# Patient Record
Sex: Male | Born: 1935
Health system: Southern US, Community
[De-identification: ages and names within clinical notes are randomized; demographics above are authoritative.]

## PROBLEM LIST (undated history)

## (undated) DIAGNOSIS — R600 Localized edema: Secondary | ICD-10-CM

## (undated) DIAGNOSIS — IMO0002 Reserved for concepts with insufficient information to code with codable children: Secondary | ICD-10-CM

## (undated) DIAGNOSIS — Q600 Renal agenesis, unilateral: Secondary | ICD-10-CM

## (undated) DIAGNOSIS — H548 Legal blindness, as defined in USA: Secondary | ICD-10-CM

## (undated) DIAGNOSIS — M1990 Unspecified osteoarthritis, unspecified site: Secondary | ICD-10-CM

## (undated) DIAGNOSIS — H3530 Unspecified macular degeneration: Secondary | ICD-10-CM

## (undated) DIAGNOSIS — T462X1A Poisoning by other antidysrhythmic drugs, accidental (unintentional), initial encounter: Secondary | ICD-10-CM

## (undated) DIAGNOSIS — K219 Gastro-esophageal reflux disease without esophagitis: Secondary | ICD-10-CM

## (undated) DIAGNOSIS — N183 Chronic kidney disease, stage 3 unspecified: Secondary | ICD-10-CM

## (undated) DIAGNOSIS — D649 Anemia, unspecified: Secondary | ICD-10-CM

## (undated) DIAGNOSIS — R310 Gross hematuria: Secondary | ICD-10-CM

## (undated) DIAGNOSIS — N135 Crossing vessel and stricture of ureter without hydronephrosis: Secondary | ICD-10-CM

## (undated) DIAGNOSIS — I509 Heart failure, unspecified: Secondary | ICD-10-CM

## (undated) DIAGNOSIS — I10 Essential (primary) hypertension: Secondary | ICD-10-CM

## (undated) DIAGNOSIS — I482 Chronic atrial fibrillation, unspecified: Secondary | ICD-10-CM

## (undated) DIAGNOSIS — R112 Nausea with vomiting, unspecified: Secondary | ICD-10-CM

## (undated) DIAGNOSIS — C679 Malignant neoplasm of bladder, unspecified: Secondary | ICD-10-CM

## (undated) DIAGNOSIS — B3741 Candidal cystitis and urethritis: Secondary | ICD-10-CM

## (undated) DIAGNOSIS — K08109 Complete loss of teeth, unspecified cause, unspecified class: Secondary | ICD-10-CM

## (undated) DIAGNOSIS — Z972 Presence of dental prosthetic device (complete) (partial): Secondary | ICD-10-CM

## (undated) DIAGNOSIS — Z9989 Dependence on other enabling machines and devices: Secondary | ICD-10-CM

## (undated) DIAGNOSIS — H409 Unspecified glaucoma: Secondary | ICD-10-CM

## (undated) DIAGNOSIS — Z87442 Personal history of urinary calculi: Secondary | ICD-10-CM

## (undated) DIAGNOSIS — E032 Hypothyroidism due to medicaments and other exogenous substances: Secondary | ICD-10-CM

## (undated) DIAGNOSIS — Z9889 Other specified postprocedural states: Secondary | ICD-10-CM

## (undated) DIAGNOSIS — Z8719 Personal history of other diseases of the digestive system: Secondary | ICD-10-CM

## (undated) HISTORY — PX: GLAUCOMA SURGERY: SHX656

## (undated) HISTORY — PX: EXTRACORPOREAL SHOCK WAVE LITHOTRIPSY: SHX1557

## (undated) HISTORY — PX: CATARACT EXTRACTION W/ INTRAOCULAR LENS  IMPLANT, BILATERAL: SHX1307

## (undated) HISTORY — PX: INGUINAL HERNIA REPAIR: SUR1180

## (undated) HISTORY — PX: COLONOSCOPY: SHX174

---

## 2000-07-10 HISTORY — PX: CARDIOVERSION: SHX1299

## 2000-07-10 HISTORY — PX: TRANSURETHRAL RESECTION OF PROSTATE: SHX73

## 2004-06-17 ENCOUNTER — Ambulatory Visit: Payer: Self-pay | Admitting: Cardiology

## 2005-02-15 ENCOUNTER — Ambulatory Visit: Payer: Self-pay | Admitting: Cardiology

## 2015-08-25 DIAGNOSIS — H401132 Primary open-angle glaucoma, bilateral, moderate stage: Secondary | ICD-10-CM | POA: Diagnosis not present

## 2015-08-26 DIAGNOSIS — Z79899 Other long term (current) drug therapy: Secondary | ICD-10-CM | POA: Diagnosis not present

## 2015-08-26 DIAGNOSIS — I10 Essential (primary) hypertension: Secondary | ICD-10-CM | POA: Diagnosis not present

## 2015-08-26 DIAGNOSIS — R5381 Other malaise: Secondary | ICD-10-CM | POA: Diagnosis not present

## 2015-08-26 DIAGNOSIS — R531 Weakness: Secondary | ICD-10-CM | POA: Diagnosis not present

## 2015-08-26 DIAGNOSIS — E119 Type 2 diabetes mellitus without complications: Secondary | ICD-10-CM | POA: Diagnosis not present

## 2015-10-04 DIAGNOSIS — R1030 Lower abdominal pain, unspecified: Secondary | ICD-10-CM | POA: Diagnosis not present

## 2015-10-04 DIAGNOSIS — E119 Type 2 diabetes mellitus without complications: Secondary | ICD-10-CM | POA: Diagnosis not present

## 2015-10-04 DIAGNOSIS — R5381 Other malaise: Secondary | ICD-10-CM | POA: Diagnosis not present

## 2015-10-04 DIAGNOSIS — I480 Paroxysmal atrial fibrillation: Secondary | ICD-10-CM | POA: Diagnosis not present

## 2015-10-04 DIAGNOSIS — Z79899 Other long term (current) drug therapy: Secondary | ICD-10-CM | POA: Diagnosis not present

## 2015-10-04 DIAGNOSIS — I482 Chronic atrial fibrillation: Secondary | ICD-10-CM | POA: Diagnosis not present

## 2015-10-04 DIAGNOSIS — I10 Essential (primary) hypertension: Secondary | ICD-10-CM | POA: Diagnosis not present

## 2015-10-04 DIAGNOSIS — R531 Weakness: Secondary | ICD-10-CM | POA: Diagnosis not present

## 2015-11-04 DIAGNOSIS — N390 Urinary tract infection, site not specified: Secondary | ICD-10-CM | POA: Diagnosis not present

## 2015-11-04 DIAGNOSIS — I10 Essential (primary) hypertension: Secondary | ICD-10-CM | POA: Diagnosis not present

## 2015-11-04 DIAGNOSIS — R351 Nocturia: Secondary | ICD-10-CM | POA: Diagnosis not present

## 2015-11-04 DIAGNOSIS — E119 Type 2 diabetes mellitus without complications: Secondary | ICD-10-CM | POA: Diagnosis not present

## 2015-11-04 DIAGNOSIS — Z79899 Other long term (current) drug therapy: Secondary | ICD-10-CM | POA: Diagnosis not present

## 2015-12-02 DIAGNOSIS — I482 Chronic atrial fibrillation: Secondary | ICD-10-CM | POA: Diagnosis not present

## 2015-12-02 DIAGNOSIS — Z79899 Other long term (current) drug therapy: Secondary | ICD-10-CM | POA: Diagnosis not present

## 2015-12-02 DIAGNOSIS — R531 Weakness: Secondary | ICD-10-CM | POA: Diagnosis not present

## 2015-12-02 DIAGNOSIS — I480 Paroxysmal atrial fibrillation: Secondary | ICD-10-CM | POA: Diagnosis not present

## 2015-12-02 DIAGNOSIS — E119 Type 2 diabetes mellitus without complications: Secondary | ICD-10-CM | POA: Diagnosis not present

## 2015-12-02 DIAGNOSIS — R5381 Other malaise: Secondary | ICD-10-CM | POA: Diagnosis not present

## 2015-12-02 DIAGNOSIS — I10 Essential (primary) hypertension: Secondary | ICD-10-CM | POA: Diagnosis not present

## 2015-12-23 DIAGNOSIS — H401233 Low-tension glaucoma, bilateral, severe stage: Secondary | ICD-10-CM | POA: Diagnosis not present

## 2016-02-01 DIAGNOSIS — E119 Type 2 diabetes mellitus without complications: Secondary | ICD-10-CM | POA: Diagnosis not present

## 2016-02-01 DIAGNOSIS — R531 Weakness: Secondary | ICD-10-CM | POA: Diagnosis not present

## 2016-02-01 DIAGNOSIS — R5381 Other malaise: Secondary | ICD-10-CM | POA: Diagnosis not present

## 2016-02-01 DIAGNOSIS — Z79899 Other long term (current) drug therapy: Secondary | ICD-10-CM | POA: Diagnosis not present

## 2016-02-01 DIAGNOSIS — N390 Urinary tract infection, site not specified: Secondary | ICD-10-CM | POA: Diagnosis not present

## 2016-02-01 DIAGNOSIS — I10 Essential (primary) hypertension: Secondary | ICD-10-CM | POA: Diagnosis not present

## 2016-02-01 DIAGNOSIS — I482 Chronic atrial fibrillation: Secondary | ICD-10-CM | POA: Diagnosis not present

## 2016-02-01 DIAGNOSIS — E039 Hypothyroidism, unspecified: Secondary | ICD-10-CM | POA: Diagnosis not present

## 2016-02-15 DIAGNOSIS — H401233 Low-tension glaucoma, bilateral, severe stage: Secondary | ICD-10-CM | POA: Diagnosis not present

## 2016-03-09 DIAGNOSIS — E119 Type 2 diabetes mellitus without complications: Secondary | ICD-10-CM | POA: Diagnosis not present

## 2016-03-09 DIAGNOSIS — I10 Essential (primary) hypertension: Secondary | ICD-10-CM | POA: Diagnosis not present

## 2016-03-16 DIAGNOSIS — R319 Hematuria, unspecified: Secondary | ICD-10-CM | POA: Diagnosis not present

## 2016-03-16 DIAGNOSIS — Q618 Other cystic kidney diseases: Secondary | ICD-10-CM | POA: Diagnosis not present

## 2016-03-28 DIAGNOSIS — H401233 Low-tension glaucoma, bilateral, severe stage: Secondary | ICD-10-CM | POA: Diagnosis not present

## 2016-04-04 DIAGNOSIS — I10 Essential (primary) hypertension: Secondary | ICD-10-CM | POA: Diagnosis not present

## 2016-04-04 DIAGNOSIS — E119 Type 2 diabetes mellitus without complications: Secondary | ICD-10-CM | POA: Diagnosis not present

## 2016-04-04 DIAGNOSIS — R5381 Other malaise: Secondary | ICD-10-CM | POA: Diagnosis not present

## 2016-04-04 DIAGNOSIS — Z79899 Other long term (current) drug therapy: Secondary | ICD-10-CM | POA: Diagnosis not present

## 2016-04-04 DIAGNOSIS — N189 Chronic kidney disease, unspecified: Secondary | ICD-10-CM | POA: Diagnosis not present

## 2016-04-04 DIAGNOSIS — D649 Anemia, unspecified: Secondary | ICD-10-CM | POA: Diagnosis not present

## 2016-04-04 DIAGNOSIS — N390 Urinary tract infection, site not specified: Secondary | ICD-10-CM | POA: Diagnosis not present

## 2016-04-04 DIAGNOSIS — R531 Weakness: Secondary | ICD-10-CM | POA: Diagnosis not present

## 2016-04-19 DIAGNOSIS — N181 Chronic kidney disease, stage 1: Secondary | ICD-10-CM | POA: Diagnosis not present

## 2016-04-19 DIAGNOSIS — E119 Type 2 diabetes mellitus without complications: Secondary | ICD-10-CM | POA: Diagnosis not present

## 2016-05-03 DIAGNOSIS — Z79899 Other long term (current) drug therapy: Secondary | ICD-10-CM | POA: Diagnosis not present

## 2016-05-03 DIAGNOSIS — H409 Unspecified glaucoma: Secondary | ICD-10-CM | POA: Diagnosis not present

## 2016-05-03 DIAGNOSIS — N390 Urinary tract infection, site not specified: Secondary | ICD-10-CM | POA: Diagnosis not present

## 2016-05-03 DIAGNOSIS — Z87891 Personal history of nicotine dependence: Secondary | ICD-10-CM | POA: Diagnosis not present

## 2016-05-03 DIAGNOSIS — D649 Anemia, unspecified: Secondary | ICD-10-CM | POA: Diagnosis not present

## 2016-05-03 DIAGNOSIS — E039 Hypothyroidism, unspecified: Secondary | ICD-10-CM | POA: Diagnosis not present

## 2016-05-03 DIAGNOSIS — R310 Gross hematuria: Secondary | ICD-10-CM | POA: Diagnosis not present

## 2016-05-03 DIAGNOSIS — Z87442 Personal history of urinary calculi: Secondary | ICD-10-CM | POA: Diagnosis not present

## 2016-05-03 DIAGNOSIS — I10 Essential (primary) hypertension: Secondary | ICD-10-CM | POA: Diagnosis not present

## 2016-05-03 DIAGNOSIS — R339 Retention of urine, unspecified: Secondary | ICD-10-CM | POA: Diagnosis not present

## 2016-05-03 DIAGNOSIS — I482 Chronic atrial fibrillation: Secondary | ICD-10-CM | POA: Diagnosis not present

## 2016-05-08 DIAGNOSIS — C651 Malignant neoplasm of right renal pelvis: Secondary | ICD-10-CM | POA: Diagnosis not present

## 2016-05-08 DIAGNOSIS — T83028A Displacement of other indwelling urethral catheter, initial encounter: Secondary | ICD-10-CM | POA: Diagnosis not present

## 2016-05-08 DIAGNOSIS — I10 Essential (primary) hypertension: Secondary | ICD-10-CM | POA: Diagnosis not present

## 2016-05-08 DIAGNOSIS — R319 Hematuria, unspecified: Secondary | ICD-10-CM | POA: Diagnosis not present

## 2016-05-08 DIAGNOSIS — R102 Pelvic and perineal pain: Secondary | ICD-10-CM | POA: Diagnosis not present

## 2016-05-08 DIAGNOSIS — T8189XA Other complications of procedures, not elsewhere classified, initial encounter: Secondary | ICD-10-CM | POA: Diagnosis not present

## 2016-05-08 DIAGNOSIS — N390 Urinary tract infection, site not specified: Secondary | ICD-10-CM | POA: Diagnosis not present

## 2016-05-08 DIAGNOSIS — R1030 Lower abdominal pain, unspecified: Secondary | ICD-10-CM | POA: Diagnosis not present

## 2016-05-08 DIAGNOSIS — T82898A Other specified complication of vascular prosthetic devices, implants and grafts, initial encounter: Secondary | ICD-10-CM | POA: Diagnosis not present

## 2016-05-08 DIAGNOSIS — R109 Unspecified abdominal pain: Secondary | ICD-10-CM | POA: Diagnosis not present

## 2016-05-08 DIAGNOSIS — Z79899 Other long term (current) drug therapy: Secondary | ICD-10-CM | POA: Diagnosis not present

## 2016-05-08 DIAGNOSIS — R339 Retention of urine, unspecified: Secondary | ICD-10-CM | POA: Diagnosis not present

## 2016-05-12 DIAGNOSIS — N2889 Other specified disorders of kidney and ureter: Secondary | ICD-10-CM | POA: Diagnosis not present

## 2016-05-18 DIAGNOSIS — R109 Unspecified abdominal pain: Secondary | ICD-10-CM | POA: Diagnosis not present

## 2016-05-18 DIAGNOSIS — N2889 Other specified disorders of kidney and ureter: Secondary | ICD-10-CM | POA: Diagnosis not present

## 2016-05-18 DIAGNOSIS — N3289 Other specified disorders of bladder: Secondary | ICD-10-CM | POA: Diagnosis not present

## 2016-05-18 DIAGNOSIS — R112 Nausea with vomiting, unspecified: Secondary | ICD-10-CM | POA: Diagnosis not present

## 2016-06-04 DIAGNOSIS — D49511 Neoplasm of unspecified behavior of right kidney: Secondary | ICD-10-CM | POA: Diagnosis not present

## 2016-06-04 DIAGNOSIS — H409 Unspecified glaucoma: Secondary | ICD-10-CM | POA: Diagnosis not present

## 2016-06-04 DIAGNOSIS — R319 Hematuria, unspecified: Secondary | ICD-10-CM | POA: Diagnosis not present

## 2016-06-04 DIAGNOSIS — R339 Retention of urine, unspecified: Secondary | ICD-10-CM | POA: Diagnosis not present

## 2016-06-04 DIAGNOSIS — C649 Malignant neoplasm of unspecified kidney, except renal pelvis: Secondary | ICD-10-CM | POA: Diagnosis not present

## 2016-06-04 DIAGNOSIS — I10 Essential (primary) hypertension: Secondary | ICD-10-CM | POA: Diagnosis not present

## 2016-06-04 DIAGNOSIS — N1330 Unspecified hydronephrosis: Secondary | ICD-10-CM | POA: Diagnosis not present

## 2016-06-04 DIAGNOSIS — E039 Hypothyroidism, unspecified: Secondary | ICD-10-CM | POA: Diagnosis not present

## 2016-06-04 DIAGNOSIS — Z79899 Other long term (current) drug therapy: Secondary | ICD-10-CM | POA: Diagnosis not present

## 2016-06-04 DIAGNOSIS — N390 Urinary tract infection, site not specified: Secondary | ICD-10-CM | POA: Diagnosis not present

## 2016-06-04 DIAGNOSIS — R8299 Other abnormal findings in urine: Secondary | ICD-10-CM | POA: Diagnosis not present

## 2016-06-08 DIAGNOSIS — N1330 Unspecified hydronephrosis: Secondary | ICD-10-CM | POA: Diagnosis not present

## 2016-06-08 DIAGNOSIS — I2510 Atherosclerotic heart disease of native coronary artery without angina pectoris: Secondary | ICD-10-CM | POA: Diagnosis not present

## 2016-06-08 DIAGNOSIS — I509 Heart failure, unspecified: Secondary | ICD-10-CM | POA: Diagnosis not present

## 2016-06-08 DIAGNOSIS — I10 Essential (primary) hypertension: Secondary | ICD-10-CM | POA: Diagnosis not present

## 2016-06-08 DIAGNOSIS — I4891 Unspecified atrial fibrillation: Secondary | ICD-10-CM | POA: Diagnosis not present

## 2016-06-08 DIAGNOSIS — R310 Gross hematuria: Secondary | ICD-10-CM | POA: Diagnosis not present

## 2016-06-08 DIAGNOSIS — R339 Retention of urine, unspecified: Secondary | ICD-10-CM | POA: Diagnosis not present

## 2016-06-13 DIAGNOSIS — N2889 Other specified disorders of kidney and ureter: Secondary | ICD-10-CM | POA: Diagnosis not present

## 2016-06-13 DIAGNOSIS — R338 Other retention of urine: Secondary | ICD-10-CM | POA: Diagnosis not present

## 2016-06-13 DIAGNOSIS — Z23 Encounter for immunization: Secondary | ICD-10-CM | POA: Diagnosis not present

## 2016-06-22 DIAGNOSIS — R3912 Poor urinary stream: Secondary | ICD-10-CM | POA: Diagnosis not present

## 2016-06-22 DIAGNOSIS — C651 Malignant neoplasm of right renal pelvis: Secondary | ICD-10-CM | POA: Diagnosis not present

## 2016-06-22 DIAGNOSIS — C672 Malignant neoplasm of lateral wall of bladder: Secondary | ICD-10-CM | POA: Diagnosis not present

## 2016-06-22 DIAGNOSIS — N261 Atrophy of kidney (terminal): Secondary | ICD-10-CM | POA: Diagnosis not present

## 2016-06-26 ENCOUNTER — Other Ambulatory Visit: Payer: Self-pay | Admitting: Urology

## 2016-07-07 ENCOUNTER — Encounter (HOSPITAL_BASED_OUTPATIENT_CLINIC_OR_DEPARTMENT_OTHER): Payer: Self-pay | Admitting: *Deleted

## 2016-07-07 NOTE — Progress Notes (Addendum)
NPO AFTER MN.  ARRIVE AT 0700.  NEEDS ISTAT 8 AND EKG.  WILL TAKE METOPROLOL AND TYLENOL AM DOS W/ SIPS OF WATER.    RECEIVED LOV NOTES AND CURRENT EKG FROM PT'S PCP, DR Cleda Mccreedy IN [facility] [phone], FAX).  ASK MDA DOS IF REPEAT EKG.

## 2016-07-11 ENCOUNTER — Encounter (HOSPITAL_BASED_OUTPATIENT_CLINIC_OR_DEPARTMENT_OTHER): Payer: Self-pay | Admitting: *Deleted

## 2016-07-12 ENCOUNTER — Ambulatory Visit (HOSPITAL_BASED_OUTPATIENT_CLINIC_OR_DEPARTMENT_OTHER): Payer: Medicare Other | Admitting: Anesthesiology

## 2016-07-12 ENCOUNTER — Observation Stay (HOSPITAL_BASED_OUTPATIENT_CLINIC_OR_DEPARTMENT_OTHER)
Admission: RE | Admit: 2016-07-12 | Discharge: 2016-07-13 | Disposition: A | Discharge status: Home / Self Care | Payer: Medicare Other | Source: Ambulatory Visit | Attending: Urology | Admitting: Urology

## 2016-07-12 ENCOUNTER — Encounter (HOSPITAL_BASED_OUTPATIENT_CLINIC_OR_DEPARTMENT_OTHER): Payer: Self-pay | Admitting: Anesthesiology

## 2016-07-12 ENCOUNTER — Encounter (HOSPITAL_COMMUNITY)
Admission: RE | Disposition: A | Discharge status: Home / Self Care | Payer: Self-pay | Source: Ambulatory Visit | Attending: Urology

## 2016-07-12 DIAGNOSIS — Z87891 Personal history of nicotine dependence: Secondary | ICD-10-CM | POA: Diagnosis not present

## 2016-07-12 DIAGNOSIS — C7919 Secondary malignant neoplasm of other urinary organs: Secondary | ICD-10-CM | POA: Diagnosis not present

## 2016-07-12 DIAGNOSIS — M1990 Unspecified osteoarthritis, unspecified site: Secondary | ICD-10-CM | POA: Insufficient documentation

## 2016-07-12 DIAGNOSIS — C661 Malignant neoplasm of right ureter: Secondary | ICD-10-CM | POA: Diagnosis not present

## 2016-07-12 DIAGNOSIS — I4891 Unspecified atrial fibrillation: Secondary | ICD-10-CM | POA: Insufficient documentation

## 2016-07-12 DIAGNOSIS — H3530 Unspecified macular degeneration: Secondary | ICD-10-CM | POA: Insufficient documentation

## 2016-07-12 DIAGNOSIS — C679 Malignant neoplasm of bladder, unspecified: Principal | ICD-10-CM | POA: Diagnosis present

## 2016-07-12 DIAGNOSIS — C651 Malignant neoplasm of right renal pelvis: Secondary | ICD-10-CM | POA: Diagnosis not present

## 2016-07-12 DIAGNOSIS — J449 Chronic obstructive pulmonary disease, unspecified: Secondary | ICD-10-CM | POA: Insufficient documentation

## 2016-07-12 DIAGNOSIS — I129 Hypertensive chronic kidney disease with stage 1 through stage 4 chronic kidney disease, or unspecified chronic kidney disease: Secondary | ICD-10-CM | POA: Diagnosis not present

## 2016-07-12 DIAGNOSIS — Z87442 Personal history of urinary calculi: Secondary | ICD-10-CM | POA: Diagnosis not present

## 2016-07-12 DIAGNOSIS — C672 Malignant neoplasm of lateral wall of bladder: Secondary | ICD-10-CM | POA: Diagnosis not present

## 2016-07-12 DIAGNOSIS — E039 Hypothyroidism, unspecified: Secondary | ICD-10-CM | POA: Insufficient documentation

## 2016-07-12 DIAGNOSIS — K219 Gastro-esophageal reflux disease without esophagitis: Secondary | ICD-10-CM | POA: Insufficient documentation

## 2016-07-12 DIAGNOSIS — N400 Enlarged prostate without lower urinary tract symptoms: Secondary | ICD-10-CM | POA: Insufficient documentation

## 2016-07-12 DIAGNOSIS — N183 Chronic kidney disease, stage 3 (moderate): Secondary | ICD-10-CM | POA: Diagnosis not present

## 2016-07-12 DIAGNOSIS — I482 Chronic atrial fibrillation: Secondary | ICD-10-CM | POA: Insufficient documentation

## 2016-07-12 HISTORY — DX: Unspecified osteoarthritis, unspecified site: M19.90

## 2016-07-12 HISTORY — PX: TRANSURETHRAL RESECTION OF BLADDER TUMOR: SHX2575

## 2016-07-12 HISTORY — DX: Other specified postprocedural states: Z98.890

## 2016-07-12 HISTORY — DX: Localized edema: R60.0

## 2016-07-12 HISTORY — DX: Malignant neoplasm of bladder, unspecified: C67.9

## 2016-07-12 HISTORY — DX: Unspecified macular degeneration: H35.30

## 2016-07-12 HISTORY — DX: Unspecified glaucoma: H40.9

## 2016-07-12 HISTORY — DX: Gastro-esophageal reflux disease without esophagitis: K21.9

## 2016-07-12 HISTORY — DX: Chronic atrial fibrillation, unspecified: I48.20

## 2016-07-12 HISTORY — DX: Chronic kidney disease, stage 3 unspecified: N18.30

## 2016-07-12 HISTORY — DX: Essential (primary) hypertension: I10

## 2016-07-12 HISTORY — DX: Hypothyroidism due to medicaments and other exogenous substances: E03.2

## 2016-07-12 HISTORY — DX: Chronic kidney disease, stage 3 (moderate): N18.3

## 2016-07-12 HISTORY — DX: Poisoning by other antidysrhythmic drugs, accidental (unintentional), initial encounter: T46.2X1A

## 2016-07-12 HISTORY — DX: Complete loss of teeth, unspecified cause, unspecified class: K08.109

## 2016-07-12 HISTORY — DX: Presence of dental prosthetic device (complete) (partial): Z97.2

## 2016-07-12 HISTORY — DX: Nausea with vomiting, unspecified: R11.2

## 2016-07-12 HISTORY — DX: Gross hematuria: R31.0

## 2016-07-12 HISTORY — DX: Personal history of urinary calculi: Z87.442

## 2016-07-12 HISTORY — DX: Personal history of other diseases of the digestive system: Z87.19

## 2016-07-12 LAB — POCT I-STAT, CHEM 8
BUN: 25 mg/dL — ABNORMAL HIGH (ref 6–20)
CALCIUM ION: 1.32 mmol/L (ref 1.15–1.40)
CHLORIDE: 104 mmol/L (ref 101–111)
Creatinine, Ser: 1.1 mg/dL (ref 0.61–1.24)
Glucose, Bld: 126 mg/dL — ABNORMAL HIGH (ref 65–99)
HCT: 33 % — ABNORMAL LOW (ref 39.0–52.0)
HEMOGLOBIN: 11.2 g/dL — AB (ref 13.0–17.0)
POTASSIUM: 4.3 mmol/L (ref 3.5–5.1)
SODIUM: 138 mmol/L (ref 135–145)
TCO2: 23 mmol/L (ref 0–100)

## 2016-07-12 SURGERY — TURBT (TRANSURETHRAL RESECTION OF BLADDER TUMOR)
Anesthesia: General | Site: Bladder | Laterality: Right

## 2016-07-12 MED ORDER — KCL IN DEXTROSE-NACL 20-5-0.45 MEQ/L-%-% IV SOLN
INTRAVENOUS | Status: DC
  Administered 2016-07-12: 15:00:00 via INTRAVENOUS
  Filled 2016-07-12 (×2): qty 1000

## 2016-07-12 MED ORDER — FENTANYL CITRATE (PF) 100 MCG/2ML IJ SOLN
25.0000 ug | INTRAMUSCULAR | Status: DC | PRN
  Administered 2016-07-12: 25 ug via INTRAVENOUS
  Filled 2016-07-12: qty 1

## 2016-07-12 MED ORDER — GABAPENTIN 300 MG PO CAPS
300.0000 mg | ORAL_CAPSULE | Freq: Every day | ORAL | Status: DC
  Administered 2016-07-12: 300 mg via ORAL
  Filled 2016-07-12: qty 1

## 2016-07-12 MED ORDER — IOHEXOL 300 MG/ML  SOLN
INTRAMUSCULAR | Status: DC | PRN
  Administered 2016-07-12: 20 mL

## 2016-07-12 MED ORDER — ACETAMINOPHEN 500 MG PO TABS
1000.0000 mg | ORAL_TABLET | Freq: Three times a day (TID) | ORAL | Status: DC
  Administered 2016-07-12: 1000 mg via ORAL
  Filled 2016-07-12 (×2): qty 2

## 2016-07-12 MED ORDER — BRIMONIDINE TARTRATE 0.15 % OP SOLN
1.0000 [drp] | Freq: Two times a day (BID) | OPHTHALMIC | Status: DC
  Administered 2016-07-12: 1 [drp] via OPHTHALMIC
  Filled 2016-07-12: qty 5

## 2016-07-12 MED ORDER — SENNOSIDES-DOCUSATE SODIUM 8.6-50 MG PO TABS
1.0000 | ORAL_TABLET | Freq: Two times a day (BID) | ORAL | Status: DC
  Administered 2016-07-12: 1 via ORAL
  Filled 2016-07-12: qty 1

## 2016-07-12 MED ORDER — PROPOFOL 10 MG/ML IV BOLUS
INTRAVENOUS | Status: AC
  Filled 2016-07-12: qty 40

## 2016-07-12 MED ORDER — GENTAMICIN SULFATE 40 MG/ML IJ SOLN
5.0000 mg/kg | INTRAVENOUS | Status: AC
  Administered 2016-07-12: 360 mg via INTRAVENOUS
  Filled 2016-07-12: qty 9

## 2016-07-12 MED ORDER — METOPROLOL TARTRATE 25 MG PO TABS
12.5000 mg | ORAL_TABLET | Freq: Three times a day (TID) | ORAL | Status: DC
  Administered 2016-07-12 – 2016-07-13 (×3): 12.5 mg via ORAL
  Filled 2016-07-12 (×3): qty 1

## 2016-07-12 MED ORDER — SUCCINYLCHOLINE CHLORIDE 200 MG/10ML IV SOSY
PREFILLED_SYRINGE | INTRAVENOUS | Status: AC
  Filled 2016-07-12: qty 10

## 2016-07-12 MED ORDER — SUCCINYLCHOLINE CHLORIDE 20 MG/ML IJ SOLN
INTRAMUSCULAR | Status: DC | PRN
  Administered 2016-07-12: 100 mg via INTRAVENOUS

## 2016-07-12 MED ORDER — TAMSULOSIN HCL 0.4 MG PO CAPS
0.4000 mg | ORAL_CAPSULE | Freq: Every day | ORAL | Status: DC
  Administered 2016-07-12: 0.4 mg via ORAL
  Filled 2016-07-12: qty 1

## 2016-07-12 MED ORDER — DEXAMETHASONE SODIUM PHOSPHATE 4 MG/ML IJ SOLN
INTRAMUSCULAR | Status: DC | PRN
  Administered 2016-07-12: 5 mg via INTRAVENOUS

## 2016-07-12 MED ORDER — ONDANSETRON HCL 4 MG/2ML IJ SOLN
INTRAMUSCULAR | Status: AC
  Filled 2016-07-12: qty 2

## 2016-07-12 MED ORDER — ONDANSETRON HCL 4 MG/2ML IJ SOLN
4.0000 mg | INTRAMUSCULAR | Status: DC | PRN
Start: 2016-07-12 — End: 2016-07-13

## 2016-07-12 MED ORDER — FENTANYL CITRATE (PF) 100 MCG/2ML IJ SOLN
INTRAMUSCULAR | Status: AC
  Filled 2016-07-12: qty 2

## 2016-07-12 MED ORDER — PHENYLEPHRINE HCL 10 MG/ML IJ SOLN
INTRAMUSCULAR | Status: DC | PRN
  Administered 2016-07-12 (×4): 40 ug via INTRAVENOUS

## 2016-07-12 MED ORDER — TRAMADOL HCL 50 MG PO TABS
50.0000 mg | ORAL_TABLET | Freq: Four times a day (QID) | ORAL | Status: DC | PRN
  Administered 2016-07-12: 100 mg via ORAL
  Filled 2016-07-12: qty 2

## 2016-07-12 MED ORDER — BELLADONNA ALKALOIDS-OPIUM 16.2-60 MG RE SUPP
1.0000 | Freq: Three times a day (TID) | RECTAL | Status: DC | PRN
  Administered 2016-07-12: 1 via RECTAL
  Filled 2016-07-12: qty 1

## 2016-07-12 MED ORDER — LIDOCAINE HCL (CARDIAC) 20 MG/ML IV SOLN
INTRAVENOUS | Status: DC | PRN
  Administered 2016-07-12: 40 mg via INTRAVENOUS

## 2016-07-12 MED ORDER — ONDANSETRON HCL 4 MG/2ML IJ SOLN
INTRAMUSCULAR | Status: DC | PRN
  Administered 2016-07-12: 4 mg via INTRAVENOUS

## 2016-07-12 MED ORDER — LIDOCAINE 2% (20 MG/ML) 5 ML SYRINGE
INTRAMUSCULAR | Status: AC
  Filled 2016-07-12: qty 5

## 2016-07-12 MED ORDER — LIDOCAINE HCL 2 % EX GEL
CUTANEOUS | Status: AC
  Filled 2016-07-12: qty 5

## 2016-07-12 MED ORDER — HYDROMORPHONE HCL 1 MG/ML IJ SOLN: 0.5000 mg | INTRAMUSCULAR | Status: DC | PRN

## 2016-07-12 MED ORDER — FUROSEMIDE 20 MG PO TABS
20.0000 mg | ORAL_TABLET | Freq: Every morning | ORAL | Status: DC
  Administered 2016-07-12: 20 mg via ORAL
  Filled 2016-07-12: qty 1

## 2016-07-12 MED ORDER — BELLADONNA ALKALOIDS-OPIUM 16.2-60 MG RE SUPP
RECTAL | Status: AC
  Filled 2016-07-12: qty 1

## 2016-07-12 MED ORDER — FENTANYL CITRATE (PF) 100 MCG/2ML IJ SOLN
INTRAMUSCULAR | Status: DC | PRN
  Administered 2016-07-12: 25 ug via INTRAVENOUS
  Administered 2016-07-12: 75 ug via INTRAVENOUS
  Administered 2016-07-12: 25 ug via INTRAVENOUS

## 2016-07-12 MED ORDER — LEVOTHYROXINE SODIUM 50 MCG PO TABS
50.0000 ug | ORAL_TABLET | Freq: Every day | ORAL | Status: DC
  Administered 2016-07-12: 50 ug via ORAL
  Filled 2016-07-12: qty 1

## 2016-07-12 MED ORDER — LATANOPROST 0.005 % OP SOLN
2.0000 [drp] | Freq: Every day | OPHTHALMIC | Status: DC
  Administered 2016-07-12: 2 [drp] via OPHTHALMIC
  Filled 2016-07-12: qty 2.5

## 2016-07-12 MED ORDER — SODIUM CHLORIDE 0.9 % IV SOLN
INTRAVENOUS | Status: DC
  Administered 2016-07-12: 08:00:00 via INTRAVENOUS
  Filled 2016-07-12: qty 1000

## 2016-07-12 MED ORDER — PROPOFOL 10 MG/ML IV BOLUS
INTRAVENOUS | Status: DC | PRN
  Administered 2016-07-12: 80 mg via INTRAVENOUS
  Administered 2016-07-12: 30 mg via INTRAVENOUS
  Administered 2016-07-12: 20 mg via INTRAVENOUS

## 2016-07-12 MED ORDER — DEXAMETHASONE SODIUM PHOSPHATE 10 MG/ML IJ SOLN
INTRAMUSCULAR | Status: AC
  Filled 2016-07-12: qty 1

## 2016-07-12 SURGICAL SUPPLY — 32 items
BAG DRAIN URO-CYSTO SKYTR STRL (DRAIN) ×3 IMPLANT
BAG URINE DRAINAGE (UROLOGICAL SUPPLIES) IMPLANT
BAG URINE LEG 19OZ MD ST LTX (BAG) IMPLANT
BAG URINE LEG 500ML (DRAIN) IMPLANT
BASKET LASER NITINOL 1.9FR (BASKET) ×3 IMPLANT
CATH FOLEY 2WAY SLVR  5CC 22FR (CATHETERS)
CATH FOLEY 2WAY SLVR 30CC 20FR (CATHETERS) IMPLANT
CATH FOLEY 2WAY SLVR 5CC 22FR (CATHETERS) IMPLANT
CATH HEMA 3WAY 30CC 22FR COUDE (CATHETERS) ×3 IMPLANT
CATH INTERMIT  6FR 70CM (CATHETERS) ×3 IMPLANT
CLOTH BEACON ORANGE TIMEOUT ST (SAFETY) ×3 IMPLANT
DRSG TELFA 3X8 NADH (GAUZE/BANDAGES/DRESSINGS) ×3 IMPLANT
ELECT REM PT RETURN 9FT ADLT (ELECTROSURGICAL) ×3
ELECTRODE REM PT RTRN 9FT ADLT (ELECTROSURGICAL) ×1 IMPLANT
EVACUATOR MICROVAS BLADDER (UROLOGICAL SUPPLIES) IMPLANT
GLOVE BIO SURGEON STRL SZ7.5 (GLOVE) ×3 IMPLANT
GOWN STRL REUS W/ TWL LRG LVL3 (GOWN DISPOSABLE) ×1 IMPLANT
GOWN STRL REUS W/TWL LRG LVL3 (GOWN DISPOSABLE) ×2
GUIDEWIRE ANG ZIPWIRE 038X150 (WIRE) ×3 IMPLANT
GUIDEWIRE STR DUAL SENSOR (WIRE) ×3 IMPLANT
KIT ROOM TURNOVER WOR (KITS) ×3 IMPLANT
LOOP CUT BIPOLAR 24F LRG (ELECTROSURGICAL) ×3 IMPLANT
MANIFOLD NEPTUNE II (INSTRUMENTS) IMPLANT
PACK CYSTO (CUSTOM PROCEDURE TRAY) ×3 IMPLANT
PLUG CATH AND CAP STER (CATHETERS) ×3 IMPLANT
SET ASPIRATION TUBING (TUBING) IMPLANT
STENT URET 6FRX24 CONTOUR (STENTS) ×3 IMPLANT
SYRINGE IRR TOOMEY STRL 70CC (SYRINGE) ×3 IMPLANT
TUBE CONNECTING 12'X1/4 (SUCTIONS)
TUBE CONNECTING 12X1/4 (SUCTIONS) IMPLANT
TUBE FEEDING 5FR 15 INCH (TUBING) IMPLANT
TUBE FEEDING ENTERAL 5FR 16IN (TUBING) ×3 IMPLANT

## 2016-07-12 NOTE — Anesthesia Preprocedure Evaluation (Signed)
Anesthesia Evaluation  Patient identified by MRN, date of birth, ID band Patient awake    Reviewed: Allergy & Precautions, NPO status , Patient's Chart, lab work & pertinent test results, reviewed documented beta blocker date and time   History of Anesthesia Complications Negative for: history of anesthetic complications  Airway Mallampati: I  TM Distance: >3 FB Neck ROM: Full    Dental  (+) Edentulous Upper, Edentulous Lower   Pulmonary COPD, former smoker,    breath sounds clear to auscultation       Cardiovascular hypertension, Pt. on medications and Pt. on home beta blockers + dysrhythmias Atrial Fibrillation  Rhythm:Irregular Rate:Normal     Neuro/Psych negative neurological ROS     GI/Hepatic GERD  Medicated and Controlled,  Endo/Other  Hypothyroidism   Renal/GU negative Renal ROS     Musculoskeletal  (+) Arthritis , Osteoarthritis,    Abdominal   Peds  Hematology negative hematology ROS (+)   Anesthesia Other Findings   Reproductive/Obstetrics                             Anesthesia Physical Anesthesia Plan  ASA: III  Anesthesia Plan: General   Post-op Pain Management:    Induction: Intravenous  Airway Management Planned: Oral ETT  Additional Equipment:   Intra-op Plan:   Post-operative Plan: Extubation in OR  Informed Consent: I have reviewed the patients History and Physical, chart, labs and discussed the procedure including the risks, benefits and alternatives for the proposed anesthesia with the patient or authorized representative who has indicated his/her understanding and acceptance.     Plan Discussed with: CRNA and Surgeon  Anesthesia Plan Comments: (Plan routine monitors, GETA)        Anesthesia Quick Evaluation

## 2016-07-12 NOTE — H&P (Signed)
Austin Jackson is an 83 y.o. male.    Chief Complaint: Pre-op Transurethral Resection of Bladder Tumor and Right Ureteroscopy / Biopsy  HPI:   1 - Right Renal Pelvis Mass / Atrophic Right Kidney - large right renal pelvis mass with large hydro on [place] and CT x several 2017 on eval gross hematuria. 1 artery / 1 vein right renovascular anatomy. No associated large adenopathy.   2 - Bladder Mass / Gross Hematuria - pt with Rt wall bladder mass on CT late 2017. No tissue diagnosis. No pelvic adenopathy.   3 - Bilateral Small Adrenal Masses - >2cm Rt and Lt small adrenal masses incidetnal on CT 2017. No h/o hypokalemia, severe diabetes, or refracotry hypertension.   4 - Enlarged Prostate with Lower Urinary Tract Symptoms - s/p TURP years ago, now manages on tamsulosin.   PMH sig for AFib (not anticaogulated), mild CHF/lasix (still able to walk [facility]n [facility]), gloucoma. Bilateral inguinal hernia repair. His son Austin Jackson is ER MD in [facility] and available at [phone]. His PCP is Dr. Eula Fried in [place].   Today " Austin Jackson " is seen to proceed with TURBT and RIGHT ureteroscopy for diagnostic and theraputic intent. He is currently catheter dependant and with ongoing hematuria w/o clots.     Past Medical History:  Diagnosis Date  . Bilateral lower extremity edema   . Bladder cancer ([place])   . Chronic atrial fibrillation ([place])    dx 2002--  s/p cardioversion approx 2006 or 2007-- per pt last cardiologist visit at that time-- currently be followed by pcp dr Theodis Blaze in [place]  . CKD (chronic kidney disease), stage III   . Foley catheter in place    due to gross hematuria w/ blood clots  . Full dentures   . GERD (gastroesophageal reflux disease)   . Glaucoma, both eyes   . Gross hematuria   . History of BPH   . History of GI bleed    2013--  per pt unable to find reason but had been on blood thinner,  was transfusion's  . History of kidney stones   . Hypertension   . Hypothyroidism  due to amiodarone   . Iron deficiency anemia   . Macular degeneration of both eyes   . OA (osteoarthritis)   . PONV (postoperative nausea and vomiting)   . Renal mass, right    renal pelvis    Past Surgical History:  Procedure Laterality Date  . CARDIOVERSION  2002  . COLONOSCOPY  yrs ago  . EXTRACORPOREAL SHOCK WAVE LITHOTRIPSY  1970's  . INGUINAL HERNIA REPAIR Bilateral 2007 and 1980's  . TRANSURETHRAL RESECTION OF PROSTATE  2002    History reviewed. No pertinent family history. Social History:  reports that he quit smoking about 32 years ago. His smoking use included Cigarettes. He quit after 30.00 years of use. He has never used smokeless tobacco. He reports that he drinks alcohol. He reports that he does not use drugs.  Allergies: No Known Allergies  No prescriptions prior to admission.    No results found for this or any previous visit (from the past 48 hour(s)). No results found.  Review of Systems  Constitutional: Negative.  Negative for chills and fever.  HENT: Negative.   Eyes: Negative.   Respiratory: Negative.   Cardiovascular: Negative.   Gastrointestinal: Negative.   Genitourinary: Negative.   Musculoskeletal: Negative.   Skin: Negative.   Neurological: Negative.   Endo/Heme/Allergies: Negative.   Psychiatric/Behavioral: Negative.  Height 5\' 10"  (1.778 m), weight 72.1 kg (159 lb). Physical Exam  Constitutional: He appears well-developed.  Elderly but AOx3  HENT:  Head: Normocephalic.  Eyes: Pupils are equal, round, and reactive to light.  Neck: Normal range of motion.  Cardiovascular: Normal rate.   Respiratory: Effort normal.  GI: Soft.  Genitourinary:  Genitourinary Comments: No CVAT. In situ catheter with dark urine w/o clots.   Musculoskeletal: Normal range of motion.  Neurological: He is alert.  Skin: Skin is warm.  Psychiatric: He has a normal mood and affect.     Assessment/Plan  Very difficult situation with likely progressive  urothelial malignancy that is multifocal in very old man. This has nearly destroyed his right kidney and does not appear to be metastatic.   I feel at least he would benefit from TURBT, attempt Right ureteroscopy for tissue diagnosis / staging. If all muscle invasive / high grade he may ellect palliative approach as cure would require right neph-U cystectomy left cutaneous ureterostomy v. if low grade or non-invasive, may only require TURBT and right neph-U.   After discussion pt and his son want to proceed with TURBT / Right diagnostic ureteroscopy today as planned.    [name], MD 07/12/2016, 6:51 AM

## 2016-07-12 NOTE — Transfer of Care (Signed)
Immediate Anesthesia Transfer of Care Note  Patient: Austin Jackson  Procedure(s) Performed: Procedure(s) (LRB): TRANSURETHRAL RESECTION OF BLADDER TUMOR (TURBT)/ BILATERAL RETROGRADE PYLEOGRAM/ RIGHT DIAGNOSTIC URETEROSCOPY (Right)  Patient Location: PACU  Anesthesia Type: General  Level of Consciousness: awake, sedated, patient cooperative and responds to stimulation  Airway & Oxygen Therapy: Patient Spontanous Breathing and Patient connected to face mask oxygen  Post-op Assessment: Report given to PACU RN, Post -op Vital signs reviewed and stable and Patient moving all extremities  Post vital signs: Reviewed and stable  Complications: No apparent anesthesia complications

## 2016-07-12 NOTE — Anesthesia Procedure Notes (Signed)
Procedure Name: Intubation Date/Time: 07/12/2016 8:49 AM Performed by: Justice Rocher Pre-anesthesia Checklist: Patient identified, Emergency Drugs available, Suction available and Patient being monitored Patient Re-evaluated:Patient Re-evaluated prior to inductionOxygen Delivery Method: Circle system utilized Preoxygenation: Pre-oxygenation with 100% oxygen Intubation Type: IV induction Ventilation: Mask ventilation without difficulty Laryngoscope Size: Mac and 4 Grade View: Grade II Tube type: Oral Tube size: 8.0 mm Number of attempts: 1 Airway Equipment and Method: Stylet and Oral airway Placement Confirmation: ETT inserted through vocal cords under direct vision,  positive ETCO2 and breath sounds checked- equal and bilateral Secured at: 23 cm Tube secured with: Tape Dental Injury: Teeth and Oropharynx as per pre-operative assessment

## 2016-07-12 NOTE — Brief Op Note (Signed)
07/12/2016  9:54 AM  PATIENT:  Austin Jackson  83 y.o. male  PRE-OPERATIVE DIAGNOSIS:  RIGHT RENAL PELVIS MASS, BLADDER CANCER  POST-OPERATIVE DIAGNOSIS:  RIGHT RENAL PELVIS MASS, BLADDER CANCER  PROCEDURE:  Procedure(s): TRANSURETHRAL RESECTION OF BLADDER TUMOR (TURBT)/ BILATERAL RETROGRADE PYLEOGRAM/ RIGHT DIAGNOSTIC URETEROSCOPY (Right)  SURGEON:  Surgeon(s) and Role:    * [name], MD - Primary  PHYSICIAN ASSISTANT:   ASSISTANTS: none   ANESTHESIA:   general  EBL:  Total I/O In: 200 [I.V.:200] Out: -   BLOOD ADMINISTERED:none  DRAINS: 49F 3 way foley to gravity, irrigation port plugged.    LOCAL MEDICATIONS USED:  NONE  SPECIMEN:  Source of Specimen:  1 - Rt ureteral tumro, 2 - bladder tumor, 3 - base of bladder tumor  DISPOSITION OF SPECIMEN:  PATHOLOGY  COUNTS:  YES  TOURNIQUET:  * No tourniquets in log *  DICTATION: .Other Dictation: Dictation Number [id number]  PLAN OF CARE: Admit for overnight observation  PATIENT DISPOSITION:  PACU - hemodynamically stable.   Delay start of Pharmacological VTE agent (>24hrs) due to surgical blood loss or risk of bleeding: yes

## 2016-07-12 NOTE — Anesthesia Postprocedure Evaluation (Signed)
Anesthesia Post Note  Patient: Austin Jackson  Procedure(s) Performed: Procedure(s) (LRB): TRANSURETHRAL RESECTION OF BLADDER TUMOR (TURBT)/ BILATERAL RETROGRADE PYLEOGRAM/ RIGHT DIAGNOSTIC URETEROSCOPY (Right)  Patient location during evaluation: PACU Anesthesia Type: General Level of consciousness: awake and alert, oriented and patient cooperative Pain management: pain level controlled Vital Signs Assessment: post-procedure vital signs reviewed and stable Respiratory status: spontaneous breathing, nonlabored ventilation and respiratory function stable Cardiovascular status: blood pressure returned to baseline and stable Postop Assessment: no signs of nausea or vomiting Anesthetic complications: no       Last Vitals:  Vitals:   07/12/16 1015 07/12/16 1030  BP: 109/63 (!) 117/54  Pulse: (!) 57 63  Resp: 15 15  Temp:      Last Pain:  Vitals:   07/12/16 0815  TempSrc:   PainSc: 3                  Tekeisha Hakim,E. Ceola Para

## 2016-07-12 NOTE — Anesthesia Procedure Notes (Signed)
Performed by: Frida Wahlstrom C       

## 2016-07-13 ENCOUNTER — Other Ambulatory Visit: Payer: Self-pay | Admitting: Urology

## 2016-07-13 ENCOUNTER — Encounter (HOSPITAL_BASED_OUTPATIENT_CLINIC_OR_DEPARTMENT_OTHER): Payer: Self-pay | Admitting: Urology

## 2016-07-13 DIAGNOSIS — C679 Malignant neoplasm of bladder, unspecified: Secondary | ICD-10-CM | POA: Diagnosis not present

## 2016-07-13 DIAGNOSIS — I4891 Unspecified atrial fibrillation: Secondary | ICD-10-CM | POA: Diagnosis not present

## 2016-07-13 DIAGNOSIS — J449 Chronic obstructive pulmonary disease, unspecified: Secondary | ICD-10-CM | POA: Diagnosis not present

## 2016-07-13 DIAGNOSIS — K219 Gastro-esophageal reflux disease without esophagitis: Secondary | ICD-10-CM | POA: Diagnosis not present

## 2016-07-13 DIAGNOSIS — E039 Hypothyroidism, unspecified: Secondary | ICD-10-CM | POA: Diagnosis not present

## 2016-07-13 DIAGNOSIS — Z87891 Personal history of nicotine dependence: Secondary | ICD-10-CM | POA: Diagnosis not present

## 2016-07-13 LAB — HEMOGLOBIN AND HEMATOCRIT, BLOOD
HCT: 26.7 % — ABNORMAL LOW (ref 39.0–52.0)
Hemoglobin: 8.9 g/dL — ABNORMAL LOW (ref 13.0–17.0)

## 2016-07-13 NOTE — Discharge Summary (Signed)
Physician Discharge Summary  Patient ID: Austin Jackson MRN: [id number] DOB/AGE: [birth year] 81 y.o.  Admit date: 07/12/2016 Discharge date: 07/13/2016  Admission Diagnoses: Bladder and Right Renal / Ureteral Cancer  Discharge Diagnoses:  Active Problems:   Bladder cancer [facility])   Discharged Condition: fair  Hospital Course:   Pt underwent transurethral resection of bladder tumor with RIGHT ureteroscopy / biopsy / stent placement for multifocal urothelial carcinoma on 07/12/16, the day of admission, without acute complications.  Observed overnight given age. By the AM of POD 1 he is ambulatory, pain controlled on PO meds. Hgb 8.9 (acceptable as no dizziness / orthostasis), and felt to be adequate for discharge.   Consults: None  Significant Diagnostic Studies: labs: as per above, final surgical pathology pending.   Treatments: surgery: transurethral resection of bladder tumor with RIGHT ureteroscopy / biopsy / stent placement for multifocal urothelial carcinoma 07/12/2016.   Discharge Exam: Blood pressure 115/66, pulse 84, temperature 97.6 F (36.4 C), temperature source Oral, resp. rate 14, height 5\' 10"  (1.778 m), weight 71.4 kg (157 lb 8 oz), SpO2 97 %. General appearance: alert, cooperative, appears stated age and at baseline, son at bedside.  Eyes: negative Nose: Nares normal. Septum midline. Mucosa normal. No drainage or sinus tenderness. Throat: lips, mucosa, and tongue normal; teeth and gums normal Neck: supple, symmetrical, trachea midline Back: symmetric, no curvature. ROM normal. No CVA tenderness. Resp: non-labored on room air.  Cardio: Nl rate Male genitalia: normal, some resolving hematuria that is thin and w/o clots with 3 way catheter in place, irrigation port plugged.  Extremities: extremities normal, atraumatic, no cyanosis or edema Pulses: 2+ and symmetric Skin: Skin color, texture, turgor normal. No rashes or lesions Neurologic: Grossly normal  Disposition: Final  discharge disposition not confirmed   Allergies as of 07/13/2016      Reactions   Codeine Nausea And Vomiting      Medication List    TAKE these medications   acetaminophen 500 MG tablet Commonly known as:  TYLENOL Take 1,000 mg by mouth 2 (two) times daily.   ALPHAGAN P OP Place 1 drop into both eyes 2 (two) times daily.   ALPHAGAN P 0.1 % Soln Generic drug:  brimonidine Place 1 drop into both eyes 2 (two) times daily.   calcium carbonate 500 MG chewable tablet Commonly known as:  TUMS - dosed in mg elemental calcium Chew 1 tablet by mouth as needed for indigestion or heartburn.   furosemide 20 MG tablet Commonly known as:  LASIX Take 20 mg by mouth every morning.   gabapentin 300 MG capsule Commonly known as:  NEURONTIN Take 300 mg by mouth at bedtime.   ICAPS PO Take 2 capsules by mouth daily.   Iron 325 (65 Fe) MG Tabs Take 1 tablet by mouth 2 (two) times daily.   latanoprost 0.005 % ophthalmic solution Commonly known as:  XALATAN Place 2 drops into both eyes at bedtime.   levothyroxine 50 MCG tablet Commonly known as:  SYNTHROID, LEVOTHROID Take 50 mcg by mouth at bedtime.   metoprolol tartrate 25 MG tablet Commonly known as:  LOPRESSOR Take 12.5 mg by mouth 3 (three) times daily.   tamsulosin 0.4 MG Caps capsule Commonly known as:  FLOMAX Take 0.4 mg by mouth daily after supper.      Follow-up Information    [name], MD Follow up on 07/27/2016.   Specialty:  Urology Why:  at 10:15 for MD visit. Dr. Tresa Moore will call with pathology results  when available. Schedular will also be calling to arrange next stage surgery.  Contact information: [address] [place] [address] [phone]           Signed: [name] 07/13/2016, 7:37 AM

## 2016-07-13 NOTE — Discharge Instructions (Signed)
1 - You may have urinary urgency (bladder spasms) and bloody urine on / off with stent and in place. This is normal.  2 - Call MD or go to ER for fever >102, severe pain / nausea / vomiting not relieved by medications, or acute change in medical status

## 2016-07-13 NOTE — Progress Notes (Signed)
Pt was given Tramadol for pain, after sleeping a couple hours pt woke up confused. Due to his age I assumed the confusion was from the pain medicine. So for right now I don't think the pt should get tramadol again, due to confusion. Carmela Hurt, RN 07/13/16 2:06 AM

## 2016-07-13 NOTE — Progress Notes (Signed)
Scheduling pre op- please place SURGICAL ORDERS IN EPIC  Thanks 

## 2016-07-13 NOTE — Op Note (Signed)
NAMEBRACYN, AMICONE NO.:  [id number]  MEDICAL RECORD NO.:  [id number]  LOCATION:                                 FACILITY:  PHYSICIAN:  [name], MD     DATE OF BIRTH:  12/08/2014  DATE OF PROCEDURE:  07/12/2016                              OPERATIVE REPORT   PREOPERATIVE DIAGNOSIS:  Bladder tumor, large volume, right renal pelvis mass, suspect urothelial carcinoma.  POSTOPERATIVE DIAGNOSIS:  Bladder tumor, large volume, right renal pelvis mass, suspect urothelial carcinoma.  PROCEDURES: 1. Cystoscopy with right retrograde pyelogram and interpretation. 2. Right ureteroscopy with biopsy. 3. Insertion of right ureteral stent, 6 x 24, Contour, no tether. 4. Transurethral resection of bladder tumor, volume large.  ESTIMATED BLOOD LOSS:  Nil.  COMPLICATION:  None.  SPECIMENS: 1. Right ureteral tumor for permanent pathology. 2. Bladder tumor for permanent pathology. 3. Base of bladder tumor for permanent pathology.  DRAINS:  A 22-French 3-Foley catheter to straight drain, irrigation port plugged.  FINDINGS: 1. Mostly papillary tumor multifocal in the bladder, largest focus     right superolateral, total volume approximately 6 cm2.     Photodocumentation performed. 2. Multifocal large filling defects in right ureter consistent with     the urothelial carcinoma. 3. Visualized large volume ureteral papillary tumor. 4. Successful placement of right ureteral stent, proximal end in the     renal pelvis and distal end in the intramural ureter.  INDICATION:  Mr. Kizewski is a very pleasant and quite vigorous 83 year old gentleman, who was found on workup of gross hematuria to have large- volume bladder tumor and right renal pelvis tumor.  He has been catheter dependent due to hematuria, it is got no obvious distant disease or locally-advanced disease.  Options were discussed with the patient and his son, who is also a physician including recommended  path with transurethral resection of bladder tumor and right ureteroscopy for initial tissue diagnosis and debulking and they wished to proceed. Informed consent was obtained and placed in the medical record.  PROCEDURE IN DETAIL:  The patient being Austin Jackson, was verified. Procedure being transurethral resection of bladder tumor and right ureteroscopy was confirmed.  Procedure was carried out.  Time-out was performed.  Intravenous antibiotics were administered.  General endotracheal anesthesia was introduced.  The patient was placed into a low lithotomy position and sterile field was created by prepping and draping the patient's penis, perineum and proximal thighs using iodine x3.  Next, cystourethroscopy was performed using a 22-French rigid cystoscope with offset lens.  Inspection of the anterior and posterior revealed a prior TURP defect with excellent wide open prostatic urethra. Inspection of the urinary bladder revealed multifocal bladder tumor, moderate volume, estimated total volume approximately 6-7 cm2.  The largest focus was right superolateral above and lateral to the right ureteral orifice.  Ureteral orifices were somewhat large caliber bilaterally.  The right ureteral orifice was cannulated with 6-French end-hole catheter and right retrograde pyelogram was obtained.  Right retrograde pyelogram demonstrated a single right ureter with very abnormal intrarenal collecting system of the kidney.  There were multifocal filling defects consistent with very large volume intrarenal tumor likely urothelial carcinoma.  There was only one small focus of normal-appearing kidney in the upper pole calyx.  A 0.038 Zip wire was advanced to the level of the upper pole, set aside as a safety wire.  An 8-French feeding tube was placed in the urinary bladder for pressure release and semi-rigid ureteroscopy was performed of the distal right ureter alongside a separate Sensor working wire.   As expected, there was multifocal fairly large volume papillary tumor seen within the right ureter.  This did not appear amenable to complete endoscopic resection whatsoever.  As such, representative biopsies were obtained using Escape basket and a snaring technique x3, generating fragments for pathologic analysis to verify grade of disease.  Exquisite care was taken to avoid ureteral perforation, which did not occur, and finally a new 6 x 24 Contour-type stent was placed over the remaining safety wire.  Proximal end in the renal pelvis and distal end very distal urinary bladder, intramural bladder purposely not left intraluminally and the bladder to help avoid irritation and hematuria, but to allow better renal drainage. Next, the resectoscope sheath was used with a medium-sized loop and a very very careful systematic resection was performed of all foci of bladder tumor down what appeared to be fibromuscular stroma of the bladder and these tissue fragments were set aside, labeled as bladder tumor.  The dominant superior right focus was also further biopsied what appeared to be the muscularis propria with cold cup biopsy forceps, taking exquisite care to avoid bladder perforation, which did not occur. These separate fragments set aside, labeled as base of bladder tumor. Following these maneuvers, additional coagulation current was applied to the bladder tumor foci at the area of the bladder neck, which resulted in fantastic hemostasis, no evidence of perforation.  The resectoscope was then exchanged for a new 22-French 3-way Foley catheter, connected to straight drain.  The irrigation port was plugged.  Efflux was completely clear and the procedure was terminated.  The patient tolerated the procedure well.  There were no immediate periprocedural complications.  The patient was taken to the postanesthesia care unit in stable condition.  We will plan for observation, admission given  the patient's age, and likely discharge tomorrow if he is doing well.    ______________________________ [name], MD   ______________________________ [name], MD    TM/MEDQ  D:  07/12/2016  T:  07/12/2016  Job:  [id number]

## 2016-07-14 NOTE — Progress Notes (Signed)
Pre op 07/19/15-- NEED SURGICAL ORDERS IN EPIC PLEASE

## 2016-07-18 ENCOUNTER — Encounter (HOSPITAL_COMMUNITY): Payer: Self-pay

## 2016-07-18 ENCOUNTER — Emergency Department (HOSPITAL_COMMUNITY): Payer: Medicare Other

## 2016-07-18 ENCOUNTER — Other Ambulatory Visit: Payer: Self-pay

## 2016-07-18 ENCOUNTER — Encounter (HOSPITAL_COMMUNITY)
Admission: RE | Admit: 2016-07-18 | Discharge: 2016-07-18 | Disposition: A | Discharge status: Home / Self Care | Payer: Medicare Other | Source: Ambulatory Visit | Attending: Urology | Admitting: Urology

## 2016-07-18 ENCOUNTER — Encounter (HOSPITAL_COMMUNITY): Payer: Self-pay | Admitting: Emergency Medicine

## 2016-07-18 ENCOUNTER — Inpatient Hospital Stay (HOSPITAL_COMMUNITY)
Admission: EM | Admit: 2016-07-18 | Discharge: 2016-07-23 | DRG: 683 | Disposition: A | Discharge status: Home Health | Payer: Medicare Other | Attending: Internal Medicine | Admitting: Internal Medicine

## 2016-07-18 DIAGNOSIS — Z66 Do not resuscitate: Secondary | ICD-10-CM | POA: Diagnosis present

## 2016-07-18 DIAGNOSIS — E039 Hypothyroidism, unspecified: Secondary | ICD-10-CM | POA: Diagnosis present

## 2016-07-18 DIAGNOSIS — I130 Hypertensive heart and chronic kidney disease with heart failure and stage 1 through stage 4 chronic kidney disease, or unspecified chronic kidney disease: Secondary | ICD-10-CM | POA: Diagnosis present

## 2016-07-18 DIAGNOSIS — K219 Gastro-esophageal reflux disease without esophagitis: Secondary | ICD-10-CM | POA: Diagnosis present

## 2016-07-18 DIAGNOSIS — Z961 Presence of intraocular lens: Secondary | ICD-10-CM | POA: Diagnosis present

## 2016-07-18 DIAGNOSIS — Z87891 Personal history of nicotine dependence: Secondary | ICD-10-CM

## 2016-07-18 DIAGNOSIS — H409 Unspecified glaucoma: Secondary | ICD-10-CM | POA: Diagnosis present

## 2016-07-18 DIAGNOSIS — C672 Malignant neoplasm of lateral wall of bladder: Secondary | ICD-10-CM | POA: Diagnosis not present

## 2016-07-18 DIAGNOSIS — I482 Chronic atrial fibrillation: Secondary | ICD-10-CM | POA: Diagnosis present

## 2016-07-18 DIAGNOSIS — I509 Heart failure, unspecified: Secondary | ICD-10-CM

## 2016-07-18 DIAGNOSIS — R680 Hypothermia, not associated with low environmental temperature: Secondary | ICD-10-CM | POA: Diagnosis present

## 2016-07-18 DIAGNOSIS — R05 Cough: Secondary | ICD-10-CM

## 2016-07-18 DIAGNOSIS — Z01812 Encounter for preprocedural laboratory examination: Secondary | ICD-10-CM | POA: Insufficient documentation

## 2016-07-18 DIAGNOSIS — C661 Malignant neoplasm of right ureter: Secondary | ICD-10-CM | POA: Diagnosis present

## 2016-07-18 DIAGNOSIS — R310 Gross hematuria: Secondary | ICD-10-CM | POA: Diagnosis present

## 2016-07-18 DIAGNOSIS — E875 Hyperkalemia: Secondary | ICD-10-CM | POA: Diagnosis present

## 2016-07-18 DIAGNOSIS — Z79899 Other long term (current) drug therapy: Secondary | ICD-10-CM

## 2016-07-18 DIAGNOSIS — R410 Disorientation, unspecified: Secondary | ICD-10-CM | POA: Diagnosis present

## 2016-07-18 DIAGNOSIS — R000 Tachycardia, unspecified: Secondary | ICD-10-CM | POA: Diagnosis not present

## 2016-07-18 DIAGNOSIS — D649 Anemia, unspecified: Secondary | ICD-10-CM | POA: Diagnosis present

## 2016-07-18 DIAGNOSIS — N179 Acute kidney failure, unspecified: Principal | ICD-10-CM | POA: Diagnosis present

## 2016-07-18 DIAGNOSIS — N183 Chronic kidney disease, stage 3 (moderate): Secondary | ICD-10-CM | POA: Diagnosis present

## 2016-07-18 DIAGNOSIS — I10 Essential (primary) hypertension: Secondary | ICD-10-CM | POA: Diagnosis present

## 2016-07-18 DIAGNOSIS — Z0183 Encounter for blood typing: Secondary | ICD-10-CM

## 2016-07-18 DIAGNOSIS — C651 Malignant neoplasm of right renal pelvis: Secondary | ICD-10-CM

## 2016-07-18 DIAGNOSIS — C679 Malignant neoplasm of bladder, unspecified: Secondary | ICD-10-CM | POA: Diagnosis present

## 2016-07-18 DIAGNOSIS — M6281 Muscle weakness (generalized): Secondary | ICD-10-CM

## 2016-07-18 DIAGNOSIS — J90 Pleural effusion, not elsewhere classified: Secondary | ICD-10-CM | POA: Diagnosis present

## 2016-07-18 DIAGNOSIS — N1330 Unspecified hydronephrosis: Secondary | ICD-10-CM | POA: Diagnosis not present

## 2016-07-18 DIAGNOSIS — J449 Chronic obstructive pulmonary disease, unspecified: Secondary | ICD-10-CM | POA: Diagnosis present

## 2016-07-18 DIAGNOSIS — Z9842 Cataract extraction status, left eye: Secondary | ICD-10-CM

## 2016-07-18 DIAGNOSIS — Z9841 Cataract extraction status, right eye: Secondary | ICD-10-CM

## 2016-07-18 DIAGNOSIS — R059 Cough, unspecified: Secondary | ICD-10-CM

## 2016-07-18 DIAGNOSIS — R6510 Systemic inflammatory response syndrome (SIRS) of non-infectious origin without acute organ dysfunction: Secondary | ICD-10-CM | POA: Diagnosis present

## 2016-07-18 DIAGNOSIS — N401 Enlarged prostate with lower urinary tract symptoms: Secondary | ICD-10-CM | POA: Diagnosis not present

## 2016-07-18 DIAGNOSIS — M1990 Unspecified osteoarthritis, unspecified site: Secondary | ICD-10-CM | POA: Diagnosis present

## 2016-07-18 HISTORY — DX: Heart failure, unspecified: I50.9

## 2016-07-18 LAB — COMPREHENSIVE METABOLIC PANEL
ALT: 10 U/L — ABNORMAL LOW (ref 17–63)
AST: 18 U/L (ref 15–41)
Albumin: 2.5 g/dL — ABNORMAL LOW (ref 3.5–5.0)
Alkaline Phosphatase: 61 U/L (ref 38–126)
Anion gap: 6 (ref 5–15)
BUN: 78 mg/dL — AB (ref 6–20)
CHLORIDE: 100 mmol/L — AB (ref 101–111)
CO2: 22 mmol/L (ref 22–32)
CREATININE: 3.63 mg/dL — AB (ref 0.61–1.24)
Calcium: 8.1 mg/dL — ABNORMAL LOW (ref 8.9–10.3)
GFR calc Af Amer: 15 mL/min — ABNORMAL LOW (ref >60)
GFR, EST NON AFRICAN AMERICAN: 13 mL/min — AB (ref >60)
Glucose, Bld: 153 mg/dL — ABNORMAL HIGH (ref 65–99)
Potassium: 5.8 mmol/L — ABNORMAL HIGH (ref 3.5–5.1)
Sodium: 128 mmol/L — ABNORMAL LOW (ref 135–145)
Total Bilirubin: 0.7 mg/dL (ref 0.3–1.2)
Total Protein: 6.8 g/dL (ref 6.5–8.1)

## 2016-07-18 LAB — BASIC METABOLIC PANEL
Anion gap: 9 (ref 5–15)
BUN: 74 mg/dL — AB (ref 6–20)
CALCIUM: 8.4 mg/dL — AB (ref 8.9–10.3)
CHLORIDE: 100 mmol/L — AB (ref 101–111)
CO2: 22 mmol/L (ref 22–32)
CREATININE: 3.56 mg/dL — AB (ref 0.61–1.24)
GFR calc Af Amer: 16 mL/min — ABNORMAL LOW (ref >60)
GFR calc non Af Amer: 13 mL/min — ABNORMAL LOW (ref >60)
Glucose, Bld: 123 mg/dL — ABNORMAL HIGH (ref 65–99)
Potassium: 6.3 mmol/L (ref 3.5–5.1)
SODIUM: 131 mmol/L — AB (ref 135–145)

## 2016-07-18 LAB — CBC WITH DIFFERENTIAL/PLATELET
BASOS ABS: 0 10*3/uL (ref 0.0–0.1)
Basophils Relative: 0 %
EOS PCT: 0 %
Eosinophils Absolute: 0 10*3/uL (ref 0.0–0.7)
HCT: 26.8 % — ABNORMAL LOW (ref 39.0–52.0)
Hemoglobin: 9 g/dL — ABNORMAL LOW (ref 13.0–17.0)
LYMPHS PCT: 10 %
Lymphs Abs: 1.1 10*3/uL (ref 0.7–4.0)
MCH: 31.1 pg (ref 26.0–34.0)
MCHC: 33.6 g/dL (ref 30.0–36.0)
MCV: 92.7 fL (ref 78.0–100.0)
Monocytes Absolute: 1 10*3/uL (ref 0.1–1.0)
Monocytes Relative: 9 %
NEUTROS ABS: 9.2 10*3/uL — AB (ref 1.7–7.7)
NEUTROS PCT: 81 %
PLATELETS: 275 10*3/uL (ref 150–400)
RBC: 2.89 MIL/uL — AB (ref 4.22–5.81)
RDW: 14.5 % (ref 11.5–15.5)
WBC: 11.3 10*3/uL — ABNORMAL HIGH (ref 4.0–10.5)

## 2016-07-18 LAB — CBC
HCT: 29.4 % — ABNORMAL LOW (ref 39.0–52.0)
Hemoglobin: 9.7 g/dL — ABNORMAL LOW (ref 13.0–17.0)
MCH: 30.4 pg (ref 26.0–34.0)
MCHC: 33 g/dL (ref 30.0–36.0)
MCV: 92.2 fL (ref 78.0–100.0)
PLATELETS: 294 10*3/uL (ref 150–400)
RBC: 3.19 MIL/uL — ABNORMAL LOW (ref 4.22–5.81)
RDW: 14.4 % (ref 11.5–15.5)
WBC: 12.8 10*3/uL — AB (ref 4.0–10.5)

## 2016-07-18 LAB — I-STAT CHEM 8, ED
BUN: 73 mg/dL — AB (ref 6–20)
CREATININE: 3.9 mg/dL — AB (ref 0.61–1.24)
Calcium, Ion: 1.15 mmol/L (ref 1.15–1.40)
Chloride: 101 mmol/L (ref 101–111)
GLUCOSE: 148 mg/dL — AB (ref 65–99)
HCT: 27 % — ABNORMAL LOW (ref 39.0–52.0)
Hemoglobin: 9.2 g/dL — ABNORMAL LOW (ref 13.0–17.0)
Potassium: 5.8 mmol/L — ABNORMAL HIGH (ref 3.5–5.1)
SODIUM: 131 mmol/L — AB (ref 135–145)
TCO2: 22 mmol/L (ref 0–100)

## 2016-07-18 LAB — ABO/RH: ABO/RH(D): A POS

## 2016-07-18 LAB — PREPARE RBC (CROSSMATCH)

## 2016-07-18 MED ORDER — SODIUM CHLORIDE 0.9 % IV BOLUS (SEPSIS)
500.0000 mL | Freq: Once | INTRAVENOUS | Status: AC
  Administered 2016-07-19: 500 mL via INTRAVENOUS

## 2016-07-18 MED ORDER — SODIUM CHLORIDE 0.9 % IV SOLN
1.0000 g | Freq: Once | INTRAVENOUS | Status: AC
  Administered 2016-07-19: 1 g via INTRAVENOUS
  Filled 2016-07-18: qty 10

## 2016-07-18 MED ORDER — SODIUM CHLORIDE 0.9 % IV BOLUS (SEPSIS)
500.0000 mL | Freq: Once | INTRAVENOUS | Status: AC
  Administered 2016-07-18: 500 mL via INTRAVENOUS

## 2016-07-18 MED ORDER — FUROSEMIDE 10 MG/ML IJ SOLN
40.0000 mg | Freq: Once | INTRAMUSCULAR | Status: AC
  Administered 2016-07-19: 40 mg via INTRAVENOUS
  Filled 2016-07-18: qty 4

## 2016-07-18 NOTE — ED Triage Notes (Signed)
Pt was sent in after getting pre-op labs today for a kidney removal due to cancer. Family states the creatine 3.4+ and Potassium was >6. Pt is weaker. Alert and oriented.

## 2016-07-18 NOTE — ED Provider Notes (Addendum)
[facility] DEPT Provider Note   CSN: [id number] Arrival date & time: 07/18/16  2151     History   Chief Complaint Chief Complaint  Patient presents with  . Abnormal Lab  . Cancer    HPI Austin Jackson is a 83 y.o. male.  HPI Patient was sent in for laboratory abnormalities. Found to have a creatinine of 3.6 up from previous 1.1-6 days ago. Also found to have a potassium of 6.3. Had recent bladder ureter and kidney biopsy. Was scheduled to have a nephrectomy by Dr. Tresa Moore in 2 days. Patient has some lower abdominal pain on the left side. Has previous hematuria but had has reportedly cleared up since his surgery. Has a stent on the right side was placed at the time also. No fevers. Still having good urine output.   Past Medical History:  Diagnosis Date  . Bilateral lower extremity edema   . Bladder cancer ([place])   . Chronic atrial fibrillation ([place])    dx 2002--  s/p cardioversion approx 2006 or 2007-- per pt last cardiologist visit at that time-- currently be followed by pcp dr Theodis Blaze in [place]  . CKD (chronic kidney disease), stage III   . Dysrhythmia   . Foley catheter in place    due to gross hematuria w/ blood clots  . Full dentures   . GERD (gastroesophageal reflux disease)   . Glaucoma, both eyes   . Gross hematuria   . History of BPH   . History of GI bleed    2013--  per pt unable to find reason but had been on blood thinner,  was transfusion's  . History of kidney stones   . Hypertension   . Hypothyroidism due to amiodarone   . Iron deficiency anemia   . Macular degeneration of both eyes   . OA (osteoarthritis)   . PONV (postoperative nausea and vomiting)   . Renal mass, right    renal pelvis    Patient Active Problem List   Diagnosis Date Noted  . Bladder cancer ([place]) 07/12/2016    Past Surgical History:  Procedure Laterality Date  . CARDIOVERSION  2002  . COLONOSCOPY  yrs ago  . EXTRACORPOREAL SHOCK WAVE LITHOTRIPSY  1970's  . EYE  SURGERY Left    for glaucoma  . EYE SURGERY Bilateral    cataract extraction with IOL  . INGUINAL HERNIA REPAIR Bilateral 2007 and 1980's  . TRANSURETHRAL RESECTION OF BLADDER TUMOR Right 07/12/2016   Procedure: TRANSURETHRAL RESECTION OF BLADDER TUMOR (TURBT)/ BILATERAL RETROGRADE PYLEOGRAM/ RIGHT DIAGNOSTIC URETEROSCOPY;  Surgeon: [name], MD;  Location: [facility];  Service: Urology;  Laterality: Right;  . TRANSURETHRAL RESECTION OF PROSTATE  2002       Home Medications    Prior to Admission medications   Medication Sig Start Date End Date Taking? Authorizing Provider  acetaminophen (TYLENOL) 500 MG tablet Take 500-1,000 mg by mouth every 6 (six) hours as needed for mild pain (depends on pain).    Yes Historical Provider, MD  ALPHAGAN P 0.1 % SOLN Place 1 drop into both eyes 2 (two) times daily. 07/01/16  Yes Historical Provider, MD  calcium carbonate (TUMS - DOSED IN MG ELEMENTAL CALCIUM) 500 MG chewable tablet Chew 1 tablet by mouth as needed for indigestion or heartburn.   Yes Historical Provider, MD  Ferrous Sulfate (IRON) 325 (65 Fe) MG TABS Take 1 tablet by mouth 2 (two) times daily.   Yes Historical Provider, MD  furosemide (LASIX)  20 MG tablet Take 20 mg by mouth every morning.   Yes Historical Provider, MD  gabapentin (NEURONTIN) 300 MG capsule Take 300 mg by mouth at bedtime.   Yes Historical Provider, MD  latanoprost (XALATAN) 0.005 % ophthalmic solution Place 1 drop into both eyes at bedtime.    Yes Historical Provider, MD  levothyroxine (SYNTHROID, LEVOTHROID) 50 MCG tablet Take 50 mcg by mouth at bedtime.   Yes Historical Provider, MD  metoprolol tartrate (LOPRESSOR) 25 MG tablet Take 12.5 mg by mouth 3 (three) times daily.   Yes Historical Provider, MD  Multiple Vitamins-Minerals (ICAPS PO) Take 2 capsules by mouth daily.   Yes Historical Provider, MD  tamsulosin (FLOMAX) 0.4 MG CAPS capsule Take 0.4 mg by mouth daily after supper.   Yes Historical  Provider, MD    Family History History reviewed. No pertinent family history.  Social History Social History  Substance Use Topics  . Smoking status: Former Smoker    Years: 30.00    Types: Cigarettes    Quit date: 07/07/1984  . Smokeless tobacco: Never Used  . Alcohol use Yes     Comment: occasional     Allergies   Codeine and Ultram [tramadol hcl]   Review of Systems Review of Systems  Constitutional: Positive for appetite change and fatigue. Negative for fever.  HENT: Negative for congestion.   Respiratory: Negative for apnea.   Cardiovascular: Negative for chest pain.  Gastrointestinal: Positive for abdominal pain.  Endocrine: Negative for polyuria.  Genitourinary: Positive for flank pain.  Musculoskeletal: Negative for back pain.  Neurological: Negative for light-headedness.  Hematological: Negative for adenopathy.  Psychiatric/Behavioral: Negative for agitation.     Physical Exam Updated Vital Signs BP 121/80   Pulse 114   Temp 97.8 F (36.6 C) (Oral)   Resp 16   Ht 5\' 10"  (1.778 m)   Wt 155 lb (70.3 kg)   SpO2 97%   BMI 22.24 kg/m   Physical Exam  Constitutional: He appears well-developed.  HENT:  Head: Atraumatic.  Eyes: EOM are normal.  Cardiovascular:  Mild tachycardia  Pulmonary/Chest: Effort normal.  Abdominal: There is tenderness.  Mild left lower quadrant tenderness to left flank. No mass.  Genitourinary:  Genitourinary Comments: Foley catheter in place.  Musculoskeletal: He exhibits edema.  Neurological: He is alert.  Skin: Skin is warm.     ED Treatments / Results  Labs (all labs ordered are listed, but only abnormal results are displayed) Labs Reviewed  CBC WITH DIFFERENTIAL/PLATELET - Abnormal; Notable for the following:       Result Value   WBC 11.3 (*)    RBC 2.89 (*)    Hemoglobin 9.0 (*)    HCT 26.8 (*)    Neutro Abs 9.2 (*)    All other components within normal limits  COMPREHENSIVE METABOLIC PANEL - Abnormal;  Notable for the following:    Sodium 128 (*)    Potassium 5.8 (*)    Chloride 100 (*)    Glucose, Bld 153 (*)    BUN 78 (*)    Creatinine, Ser 3.63 (*)    Calcium 8.1 (*)    Albumin 2.5 (*)    ALT 10 (*)    GFR calc non Af Amer 13 (*)    GFR calc Af Amer 15 (*)    All other components within normal limits  I-STAT CHEM 8, ED - Abnormal; Notable for the following:    Sodium 131 (*)    Potassium 5.8 (*)  BUN 73 (*)    Creatinine, Ser 3.90 (*)    Glucose, Bld 148 (*)    Hemoglobin 9.2 (*)    HCT 27.0 (*)    All other components within normal limits    EKG  EKG Interpretation  Date/Time:  Tuesday July 18 2016 22:10:49 EST Ventricular Rate:  109 PR Interval:    QRS Duration: 104 QT Interval:  330 QTC Calculation: 445 R Axis:   44 Text Interpretation:  Atrial fibrillation Ventricular premature complex Low voltage, precordial leads Confirmed by Alvino Chapel  MD, Philana Younis [phone]) on 07/18/2016 10:35:48 PM       Radiology Ct Abdomen Pelvis Wo Contrast  Result Date: 07/18/2016 CLINICAL DATA:  Elevated creatinine over the last 6 days. Elevated potassium. Scheduled for nephrectomy in 2 days. Lower abdominal pain on the left. Hematuria. Right-sided stent. Good urine output. History of bladder cancer. EXAM: CT ABDOMEN AND PELVIS WITHOUT CONTRAST TECHNIQUE: Multidetector CT imaging of the abdomen and pelvis was performed following the standard protocol without IV contrast. COMPARISON:  None. FINDINGS: Lower chest: Areas of atelectasis and scarring in the lung bases. Cardiac enlargement. Calcification in the aortic and mitral valves is well is coronary arteries. Hepatobiliary: No focal liver abnormality is seen. No gallstones, gallbladder wall thickening, or biliary dilatation. Pancreas: Pancreas is atrophic. No inflammatory change or fluid collection. Spleen: Normal in size without focal abnormality. Adrenals/Urinary Tract: No adrenal gland nodules. Right kidney demonstrates parenchymal  atrophy. Prominence of the extrarenal pelvis with a ureteral stent in place. Proximal pigtail is within the renal pelvis. Distal pigtail is in the distal ureter. Calcification along the right renal pelvic wall. Subcentimeter exophytic lesions likely representing hemorrhagic cysts. Solid lesions not entirely excluded. Left kidney demonstrates diffuse parenchymal atrophy. There is hydronephrosis and hydroureter down to the level of the bladder. No obstructing stones are demonstrated. Within the dilated renal pelvis, there is a poorly defined hyperdense structure which could represent blood clot or a polypoid lesion. There is stranding around the left kidney which could be due to obstruction or infection. Subcentimeter exophytic lesion probably representing hemorrhagic cyst. Bladder is decompressed with a Foley catheter and cannot be well evaluated. Stomach/Bowel: Stomach is within normal limits. Appendix is not identified. No evidence of bowel wall thickening, distention, or inflammatory changes. Vascular/Lymphatic: Extensive calcification throughout the abdominal aorta and branch vessels. No aneurysm. Inferior vena cava is unremarkable. No significant lymphadenopathy. Reproductive: The Foley catheter balloon appears to be at least partially within the prostate gland. Prostate enlargement, measuring 5.2 cm diameter. Other: Supraumbilical midline abdominal wall hernia containing fat. Atrophy of the abdominal wall musculature. No free air or free fluid. Musculoskeletal: Thoracolumbar scoliosis convex towards the right. Extensive degenerative changes throughout the spine. Compression of T11 vertebra. IMPRESSION: 1. Atrophy of the right kidney. Dilated extrarenal pelvis with ureteral stent in place. Distal pigtail of the stent is in the distal right ureter. Subcentimeter exophytic lesions probably representing hemorrhagic cysts. 2. Atrophy of the left kidney. Left renal hydronephrosis and hydroureter down to the level of  the bladder. No obstructing stones demonstrated. Hyperdense mass or blood clot demonstrated in the left renal pelvis. Stranding around the left kidney may indicate obstruction or infection. Probable hemorrhagic cyst. 3. Bladder is decompressed with a Foley catheter and cannot be evaluated. At least part of the Foley catheter balloon appears to be within the prostate gland. Prostate enlargement. 4. Midline supraumbilical abdominal wall hernia containing fat. Electronically Signed   By: Lucienne Capers M.D.   On: 07-02-2017 23:21  Procedures Procedures (including critical care time)  Medications Ordered in ED Medications  sodium chloride 0.9 % bolus 500 mL (500 mLs Intravenous New Bag/Given 07/18/16 2243)     Initial Impression / Assessment and Plan / ED Course  I have reviewed the triage vital signs and the nursing notes.  Pertinent labs & imaging results that were available during my care of the patient were reviewed by me and considered in my medical decision making (see chart for details).  Clinical Course     Patient with acute renal failure and mild hyperkalemia. Minimally peaked T waves on EKG but otherwise reassuring EKG. Discussed with Dr. Tresa Moore who is well aware of this patient. States that he is worried that this is a quickly progressing tumor. Also worried about possibility of hydronephrosis on the left side. Will get noncontrast CT scan to evaluate the extent of the tumor progression and possibility of hydronephrosis on the left.   CT scan shows hydronephrosis on the left with some renal atrophy also. Discussed with Dr. Tresa Moore who will likely do a ureteral stent tomorrow. Recommended nothing by mouth at midnight. Will need some treatment for the hyperkalemia. Fluids given at this time.  Final Clinical Impressions(s) / ED Diagnoses   Final diagnoses:  Acute renal failure, unspecified acute renal failure type ([place])  Hyperkalemia  Hydronephrosis, unspecified hydronephrosis type      New Prescriptions New Prescriptions   No medications on file     Davonna Belling, MD 07/18/16 [place], MD 07/18/16 2344

## 2016-07-18 NOTE — Consult Note (Signed)
Reason for Consult: Acute on Chronic Renal Insufficiency / New Left Hydronephrosis, Right Ureteral / Renal Pelvis / Bladder Cancer,   Referring Physician: Davonna Belling MD  Austin Jackson is an 83 y.o. male.   HPI:   1 - Acute on Chronic Renal Insufficiency / New Left Hydronephrosis - Baseline Cr 1.3's with acute rise to 3.9 with hyperkalemia to 6 on pre-op and ER labs 07/18/2016. ER CT with new left hydronephrosis to level of distal ureter.   2 -  Right Ureteral / Renal Pelvis / Bladder Cancer - BX-proven high grade superficial bladder cancer and large volume multifocal Rt ureteral cancer 07/2016 on eval recurrent gross hematuria. Was tentatively planning on right nephroureterectomy later this month.   3 - Bilateral Small Adrenal Masses - >2cm Rt and Lt small adrenal masses incidetnal on CT 2017. No h/o hypokalemia, severe diabetes, or refracotry hypertension.   4 - Enlarged Prostate with Lower Urinary Tract Symptoms - s/p TURP years ago, now manages on tamsulosin.   Today "Austin Jackson" is seen in consultation for above. He admits to some subjective decrease energy and left CVAT over last few days. Minimal hematuria.    Past Medical History:  Diagnosis Date  . Bilateral lower extremity edema   . Bladder cancer ([place])   . Chronic atrial fibrillation ([place])    dx 2002--  s/p cardioversion approx 2006 or 2007-- per pt last cardiologist visit at that time-- currently be followed by pcp dr Theodis Blaze in [place]  . CKD (chronic kidney disease), stage III   . Dysrhythmia   . Foley catheter in place    due to gross hematuria w/ blood clots  . Full dentures   . GERD (gastroesophageal reflux disease)   . Glaucoma, both eyes   . Gross hematuria   . History of BPH   . History of GI bleed    2013--  per pt unable to find reason but had been on blood thinner,  was transfusion's  . History of kidney stones   . Hypertension   . Hypothyroidism due to amiodarone   . Iron deficiency anemia   .  Macular degeneration of both eyes   . OA (osteoarthritis)   . PONV (postoperative nausea and vomiting)   . Renal mass, right    renal pelvis    Past Surgical History:  Procedure Laterality Date  . CARDIOVERSION  2002  . COLONOSCOPY  yrs ago  . EXTRACORPOREAL SHOCK WAVE LITHOTRIPSY  1970's  . EYE SURGERY Left    for glaucoma  . EYE SURGERY Bilateral    cataract extraction with IOL  . INGUINAL HERNIA REPAIR Bilateral 2007 and 1980's  . TRANSURETHRAL RESECTION OF BLADDER TUMOR Right 07/12/2016   Procedure: TRANSURETHRAL RESECTION OF BLADDER TUMOR (TURBT)/ BILATERAL RETROGRADE PYLEOGRAM/ RIGHT DIAGNOSTIC URETEROSCOPY;  Surgeon: [name], MD;  [facility]  Service: Urology;  Laterality: Right;  . TRANSURETHRAL RESECTION OF PROSTATE  2002    History reviewed. No pertinent family history.  Social History:  reports that he quit smoking about 32 years ago. His smoking use included Cigarettes. He quit after 30.00 years of use. He has never used smokeless tobacco. He reports that he drinks alcohol. He reports that he does not use drugs.  Allergies:  Allergies  Allergen Reactions  . Codeine Nausea And Vomiting  . Ultram [Tramadol Hcl] Other (See Comments)    Confusion     Medications: I have reviewed the patient's current medications.  Results for orders  placed or performed during the hospital encounter of 07/18/16 (from the past 48 hour(s))  CBC with Differential     Status: Abnormal   Collection Time: 07/18/16 10:12 PM  Result Value Ref Range   WBC 11.3 (H) 4.0 - 10.5 K/uL   RBC 2.89 (L) 4.22 - 5.81 MIL/uL   Hemoglobin 9.0 (L) 13.0 - 17.0 g/dL   HCT 26.8 (L) 39.0 - 52.0 %   MCV 92.7 78.0 - 100.0 fL   MCH 31.1 26.0 - 34.0 pg   MCHC 33.6 30.0 - 36.0 g/dL   RDW 14.5 11.5 - 15.5 %   Platelets 275 150 - 400 K/uL   Neutrophils Relative % 81 %   Neutro Abs 9.2 (H) 1.7 - 7.7 K/uL   Lymphocytes Relative 10 %   Lymphs Abs 1.1 0.7 - 4.0 K/uL   Monocytes  Relative 9 %   Monocytes Absolute 1.0 0.1 - 1.0 K/uL   Eosinophils Relative 0 %   Eosinophils Absolute 0.0 0.0 - 0.7 K/uL   Basophils Relative 0 %   Basophils Absolute 0.0 0.0 - 0.1 K/uL  Comprehensive metabolic panel     Status: Abnormal   Collection Time: 07/18/16 10:12 PM  Result Value Ref Range   Sodium 128 (L) 135 - 145 mmol/L   Potassium 5.8 (H) 3.5 - 5.1 mmol/L   Chloride 100 (L) 101 - 111 mmol/L   CO2 22 22 - 32 mmol/L   Glucose, Bld 153 (H) 65 - 99 mg/dL   BUN 78 (H) 6 - 20 mg/dL   Creatinine, Ser 3.63 (H) 0.61 - 1.24 mg/dL   Calcium 8.1 (L) 8.9 - 10.3 mg/dL   Total Protein 6.8 6.5 - 8.1 g/dL   Albumin 2.5 (L) 3.5 - 5.0 g/dL   AST 18 15 - 41 U/L   ALT 10 (L) 17 - 63 U/L   Alkaline Phosphatase 61 38 - 126 U/L   Total Bilirubin 0.7 0.3 - 1.2 mg/dL   GFR calc non Af Amer 13 (L) >60 mL/min   GFR calc Af Amer 15 (L) >60 mL/min    Comment: (NOTE) The eGFR has been calculated using the CKD EPI equation. This calculation has not been validated in all clinical situations. eGFR's persistently <60 mL/min signify possible Chronic Kidney Disease.    Anion gap 6 5 - 15  I-stat Chem 8, ED     Status: Abnormal   Collection Time: 07/18/16 10:52 PM  Result Value Ref Range   Sodium 131 (L) 135 - 145 mmol/L   Potassium 5.8 (H) 3.5 - 5.1 mmol/L   Chloride 101 101 - 111 mmol/L   BUN 73 (H) 6 - 20 mg/dL   Creatinine, Ser 3.90 (H) 0.61 - 1.24 mg/dL   Glucose, Bld 148 (H) 65 - 99 mg/dL   Calcium, Ion 1.15 1.15 - 1.40 mmol/L   TCO2 22 0 - 100 mmol/L   Hemoglobin 9.2 (L) 13.0 - 17.0 g/dL   HCT 27.0 (L) 39.0 - 52.0 %    Ct Abdomen Pelvis Wo Contrast  Result Date: 07/18/2016 CLINICAL DATA:  Elevated creatinine over the last 6 days. Elevated potassium. Scheduled for nephrectomy in 2 days. Lower abdominal pain on the left. Hematuria. Right-sided stent. Good urine output. History of bladder cancer. EXAM: CT ABDOMEN AND PELVIS WITHOUT CONTRAST TECHNIQUE: Multidetector CT imaging of the  abdomen and pelvis was performed following the standard protocol without IV contrast. COMPARISON:  None. FINDINGS: Lower chest: Areas of atelectasis and scarring in the  lung bases. Cardiac enlargement. Calcification in the aortic and mitral valves is well is coronary arteries. Hepatobiliary: No focal liver abnormality is seen. No gallstones, gallbladder wall thickening, or biliary dilatation. Pancreas: Pancreas is atrophic. No inflammatory change or fluid collection. Spleen: Normal in size without focal abnormality. Adrenals/Urinary Tract: No adrenal gland nodules. Right kidney demonstrates parenchymal atrophy. Prominence of the extrarenal pelvis with a ureteral stent in place. Proximal pigtail is within the renal pelvis. Distal pigtail is in the distal ureter. Calcification along the right renal pelvic wall. Subcentimeter exophytic lesions likely representing hemorrhagic cysts. Solid lesions not entirely excluded. Left kidney demonstrates diffuse parenchymal atrophy. There is hydronephrosis and hydroureter down to the level of the bladder. No obstructing stones are demonstrated. Within the dilated renal pelvis, there is a poorly defined hyperdense structure which could represent blood clot or a polypoid lesion. There is stranding around the left kidney which could be due to obstruction or infection. Subcentimeter exophytic lesion probably representing hemorrhagic cyst. Bladder is decompressed with a Foley catheter and cannot be well evaluated. Stomach/Bowel: Stomach is within normal limits. Appendix is not identified. No evidence of bowel wall thickening, distention, or inflammatory changes. Vascular/Lymphatic: Extensive calcification throughout the abdominal aorta and branch vessels. No aneurysm. Inferior vena cava is unremarkable. No significant lymphadenopathy. Reproductive: The Foley catheter balloon appears to be at least partially within the prostate gland. Prostate enlargement, measuring 5.2 cm diameter.  Other: Supraumbilical midline abdominal wall hernia containing fat. Atrophy of the abdominal wall musculature. No free air or free fluid. Musculoskeletal: Thoracolumbar scoliosis convex towards the right. Extensive degenerative changes throughout the spine. Compression of T11 vertebra. IMPRESSION: 1. Atrophy of the right kidney. Dilated extrarenal pelvis with ureteral stent in place. Distal pigtail of the stent is in the distal right ureter. Subcentimeter exophytic lesions probably representing hemorrhagic cysts. 2. Atrophy of the left kidney. Left renal hydronephrosis and hydroureter down to the level of the bladder. No obstructing stones demonstrated. Hyperdense mass or blood clot demonstrated in the left renal pelvis. Stranding around the left kidney may indicate obstruction or infection. Probable hemorrhagic cyst. 3. Bladder is decompressed with a Foley catheter and cannot be evaluated. At least part of the Foley catheter balloon appears to be within the prostate gland. Prostate enlargement. 4. Midline supraumbilical abdominal wall hernia containing fat. Electronically Signed   By: Lucienne Capers M.D.   On: 2020/06/1317 23:21    Review of Systems  Constitutional: Positive for malaise/fatigue.  HENT: Negative.   Eyes: Negative.   Respiratory: Negative.  Negative for cough.   Cardiovascular: Negative.   Gastrointestinal: Negative.  Negative for nausea and vomiting.  Genitourinary: Positive for flank pain.  Skin: Negative.   Neurological: Negative.   Endo/Heme/Allergies: Negative.   Psychiatric/Behavioral: Negative.    Blood pressure 121/80, pulse 114, temperature 97.8 F (36.6 C), temperature source Oral, resp. rate 16, height _0  (1.778 m), weight 70.3 kg (155 lb), SpO2 97 %. Physical Exam  Constitutional: He appears well-developed.  Son at bedside in ER  Eyes: Pupils are equal, round, and reactive to light.  Neck: Normal range of motion.  Cardiovascular:  Regular tachycardia by bedside  monitor  Respiratory: Effort normal.  GI: Soft.  Genitourinary:  Genitourinary Comments: Some left CVAT present. NO Rt side symptoms at present.   Musculoskeletal: Normal range of motion.  Neurological: He is alert.  Skin: Skin is warm.  Psychiatric: He has a normal mood and affect.    Assessment/Plan:  1 - Acute on Chronic Renal  Insufficiency / New Left Hydronephrosis - overall picture concerning for possible new left malignant obstruction. Will need cysto / left retrograde / attempt left ureteral stent after initial maneuvers to correct potasium followed by left ureteroscopy to r/o left sided intraluminal mass.  Hopefully left hydro only due to edema from recent bladder tumor resection.    2 -  Right Ureteral / Renal Pelvis / Bladder Cancer - Will postpone right neph-U pending further evaluation of left side.   3 - Bilateral Small Adrenal Masses - observe.   4 - Enlarged Prostate with Lower Urinary Tract Symptoms - observe.    Hason Ofarrell 07/18/2016, 11:40 PM

## 2016-07-18 NOTE — Patient Instructions (Addendum)
Austin Jackson  07/18/2016   Your procedure is scheduled on: 05/20/17  Report to [facility] Main  Entrance take [facility]  elevators to 3rd floor to  [place] at  1030AM.  Call this number if you have problems the morning of surgery [phone]   Remember: ONLY 1 PERSON MAY GO WITH YOU TO SHORT STAY TO GET  READY MORNING OF YOUR SURGERY.  Do not  Have any gum or candy :After Midnight.----MAY HAVE CLEAR LIQUIDS MORNING OF SURGERY UNTIL 0600am--THEN NOTHING BY MOUTH FOLLOW BOWEL PREP AS INSTURCTED BY OFFICE    Take these medicines the morning of surgery with A SIP OF WATER: METOPROLOL DO NOT TAKE ANY DIABETIC MEDICATIONS DAY OF YOUR SURGERY                               You may not have any metal on your body including hair pins and              piercings  Do not wear jewelry, make-up, lotions, powders or perfumes, deodorant             Do not wear nail polish.  Do not shave  48 hours prior to surgery.              Men may shave face and neck.   Do not bring valuables to the hospital. [place].  Contacts, dentures or bridgework may not be worn into surgery.  Leave suitcase in the car. After surgery it may be brought to your room.     Patients discharged the day of surgery will not be allowed to drive home.               Please read over the following fact sheets you were given: _____________________________________________________________________             [facility] - Preparing for Surgery Before surgery, you can play an important role.  Because skin is not sterile, your skin needs to be as free of germs as possible.  You can reduce the number of germs on your skin by washing with CHG (chlorahexidine gluconate) soap before surgery.  CHG is an antiseptic cleaner which kills germs and bonds with the skin to continue killing germs even after washing. Please DO NOT use if you have an allergy to CHG or  antibacterial soaps.  If your skin becomes reddened/irritated stop using the CHG and inform your nurse when you arrive at Short Stay. Do not shave (including legs and underarms) for at least 48 hours prior to the first CHG shower.  You may shave your face/neck. Please follow these instructions carefully:  1.  Shower with CHG Soap the night before surgery and the  morning of Surgery.  2.  If you choose to wash your hair, wash your hair first as usual with your  normal  shampoo.  3.  After you shampoo, rinse your hair and body thoroughly to remove the  shampoo.                           4.  Use CHG as you would any other liquid soap.  You can apply chg directly  to the skin and wash                       Gently with a scrungie or clean washcloth.  5.  Apply the CHG Soap to your body ONLY FROM THE NECK DOWN.   Do not use on face/ open                           Wound or open sores. Avoid contact with eyes, ears mouth and genitals (private parts).                       Wash face,  Genitals (private parts) with your normal soap.             6.  Wash thoroughly, paying special attention to the area where your surgery  will be performed.  7.  Thoroughly rinse your body with warm water from the neck down.  8.  DO NOT shower/wash with your normal soap after using and rinsing off  the CHG Soap.                9.  Pat yourself dry with a clean towel.            10.  Wear clean pajamas.            11.  Place clean sheets on your bed the night of your first shower and do not  sleep with pets. Day of Surgery : Do not apply any lotions/deodorants the morning of surgery.  Please wear clean clothes to the hospital/surgery center.  FAILURE TO FOLLOW THESE INSTRUCTIONS MAY RESULT IN THE CANCELLATION OF YOUR SURGERY PATIENT SIGNATURE_________________________________  NURSE SIGNATURE__________________________________  ________________________________________________________________________    CLEAR LIQUID  DIET   Foods Allowed                                                                     Foods Excluded  Coffee and tea, regular and decaf                             liquids that you cannot  Plain Jell-O in any flavor                                             see through such as: Fruit ices (not with fruit pulp)                                     milk, soups, orange juice  Iced Popsicles                                    All solid food Carbonated beverages, regular and diet  Cranberry, grape and apple juices Sports drinks like Gatorade Lightly seasoned clear broth or consume(fat free) Sugar, honey syrup  Sample Menu Breakfast                                Lunch                                     Supper Cranberry juice                    Beef broth                            Chicken broth Jell-O                                     Grape juice                           Apple juice Coffee or tea                        Jell-O                                      Popsicle                                                Coffee or tea                        Coffee or tea  _____________________________________________________________________

## 2016-07-18 NOTE — ED Notes (Signed)
Patient returned from CT

## 2016-07-18 NOTE — ED Notes (Signed)
Patient transported to CT 

## 2016-07-18 NOTE — Progress Notes (Signed)
Son states they have received bowel prep instructions

## 2016-07-19 ENCOUNTER — Inpatient Hospital Stay (HOSPITAL_COMMUNITY): Payer: Medicare Other | Admitting: Certified Registered"

## 2016-07-19 ENCOUNTER — Encounter (HOSPITAL_COMMUNITY)
Admission: EM | Disposition: A | Discharge status: Home Health | Payer: Self-pay | Source: Home / Self Care | Attending: Internal Medicine

## 2016-07-19 ENCOUNTER — Encounter (HOSPITAL_COMMUNITY): Payer: Self-pay | Admitting: Internal Medicine

## 2016-07-19 DIAGNOSIS — N1330 Unspecified hydronephrosis: Secondary | ICD-10-CM

## 2016-07-19 DIAGNOSIS — E039 Hypothyroidism, unspecified: Secondary | ICD-10-CM

## 2016-07-19 DIAGNOSIS — C679 Malignant neoplasm of bladder, unspecified: Secondary | ICD-10-CM

## 2016-07-19 DIAGNOSIS — D649 Anemia, unspecified: Secondary | ICD-10-CM | POA: Diagnosis present

## 2016-07-19 DIAGNOSIS — N179 Acute kidney failure, unspecified: Principal | ICD-10-CM | POA: Diagnosis present

## 2016-07-19 DIAGNOSIS — I10 Essential (primary) hypertension: Secondary | ICD-10-CM | POA: Diagnosis not present

## 2016-07-19 DIAGNOSIS — R6510 Systemic inflammatory response syndrome (SIRS) of non-infectious origin without acute organ dysfunction: Secondary | ICD-10-CM | POA: Diagnosis present

## 2016-07-19 DIAGNOSIS — E875 Hyperkalemia: Secondary | ICD-10-CM

## 2016-07-19 HISTORY — PX: CYSTOSCOPY WITH RETROGRADE PYELOGRAM, URETEROSCOPY AND STENT PLACEMENT: SHX5789

## 2016-07-19 LAB — GLUCOSE, CAPILLARY
GLUCOSE-CAPILLARY: 129 mg/dL — AB (ref 65–99)
GLUCOSE-CAPILLARY: 192 mg/dL — AB (ref 65–99)
GLUCOSE-CAPILLARY: 202 mg/dL — AB (ref 65–99)
GLUCOSE-CAPILLARY: 125 mg/dL — AB (ref 65–99)
GLUCOSE-CAPILLARY: 147 mg/dL — AB (ref 65–99)
GLUCOSE-CAPILLARY: 198 mg/dL — AB (ref 65–99)
Glucose-Capillary: 156 mg/dL — ABNORMAL HIGH (ref 65–99)
Glucose-Capillary: 159 mg/dL — ABNORMAL HIGH (ref 65–99)
Glucose-Capillary: 131 mg/dL — ABNORMAL HIGH (ref 65–99)
Glucose-Capillary: 128 mg/dL — ABNORMAL HIGH (ref 65–99)

## 2016-07-19 LAB — CBC
HEMATOCRIT: 26.5 % — AB (ref 39.0–52.0)
HEMOGLOBIN: 9.1 g/dL — AB (ref 13.0–17.0)
MCH: 31.6 pg (ref 26.0–34.0)
MCHC: 34.3 g/dL (ref 30.0–36.0)
MCV: 92 fL (ref 78.0–100.0)
Platelets: 294 10*3/uL (ref 150–400)
RBC: 2.88 MIL/uL — ABNORMAL LOW (ref 4.22–5.81)
RDW: 14.4 % (ref 11.5–15.5)
WBC: 12.5 10*3/uL — AB (ref 4.0–10.5)

## 2016-07-19 LAB — BASIC METABOLIC PANEL
ANION GAP: 8 (ref 5–15)
Anion gap: 9 (ref 5–15)
BUN: 76 mg/dL — AB (ref 6–20)
BUN: 78 mg/dL — ABNORMAL HIGH (ref 6–20)
CALCIUM: 8 mg/dL — AB (ref 8.9–10.3)
CHLORIDE: 104 mmol/L (ref 101–111)
CO2: 19 mmol/L — ABNORMAL LOW (ref 22–32)
CO2: 20 mmol/L — ABNORMAL LOW (ref 22–32)
CREATININE: 3.9 mg/dL — AB (ref 0.61–1.24)
Calcium: 8.1 mg/dL — ABNORMAL LOW (ref 8.9–10.3)
Chloride: 104 mmol/L (ref 101–111)
Creatinine, Ser: 3.92 mg/dL — ABNORMAL HIGH (ref 0.61–1.24)
GFR calc Af Amer: 14 mL/min — ABNORMAL LOW (ref >60)
GFR calc Af Amer: 14 mL/min — ABNORMAL LOW (ref >60)
GFR calc non Af Amer: 12 mL/min — ABNORMAL LOW (ref >60)
GFR calc non Af Amer: 12 mL/min — ABNORMAL LOW (ref >60)
Glucose, Bld: 136 mg/dL — ABNORMAL HIGH (ref 65–99)
Glucose, Bld: 242 mg/dL — ABNORMAL HIGH (ref 65–99)
POTASSIUM: 6.2 mmol/L — AB (ref 3.5–5.1)
Potassium: 5 mmol/L (ref 3.5–5.1)
SODIUM: 131 mmol/L — AB (ref 135–145)
SODIUM: 133 mmol/L — AB (ref 135–145)

## 2016-07-19 LAB — TYPE AND SCREEN
BLOOD PRODUCT EXPIRATION DATE: 201801162359
Blood Product Expiration Date: 201801162359
Unit Type and Rh: 6200
Unit Type and Rh: 6200

## 2016-07-19 LAB — MRSA PCR SCREENING: MRSA BY PCR: NEGATIVE

## 2016-07-19 LAB — LACTIC ACID, PLASMA
Lactic Acid, Venous: 1.4 mmol/L (ref 0.5–1.9)
Lactic Acid, Venous: 1 mmol/L (ref 0.5–1.9)

## 2016-07-19 LAB — TSH: TSH: 1.52 u[IU]/mL (ref 0.350–4.500)

## 2016-07-19 LAB — PROCALCITONIN: Procalcitonin: 0.34 ng/mL

## 2016-07-19 SURGERY — CYSTOURETEROSCOPY, WITH RETROGRADE PYELOGRAM AND STENT INSERTION
Anesthesia: Monitor Anesthesia Care | Laterality: Left

## 2016-07-19 MED ORDER — LIDOCAINE HCL 2 % EX GEL
CUTANEOUS | Status: DC | PRN
  Administered 2016-07-19: 1 via TOPICAL

## 2016-07-19 MED ORDER — LIDOCAINE HCL 2 % EX GEL
CUTANEOUS | Status: AC
  Filled 2016-07-19: qty 5

## 2016-07-19 MED ORDER — LATANOPROST 0.005 % OP SOLN
1.0000 [drp] | Freq: Every day | OPHTHALMIC | Status: DC
  Administered 2016-07-19 – 2016-07-22 (×4): 1 [drp] via OPHTHALMIC
  Filled 2016-07-19: qty 2.5

## 2016-07-19 MED ORDER — PIPERACILLIN-TAZOBACTAM 3.375 G IVPB 30 MIN
3.3750 g | Freq: Once | INTRAVENOUS | Status: AC
  Administered 2016-07-19: 3.375 g via INTRAVENOUS
  Filled 2016-07-19: qty 50

## 2016-07-19 MED ORDER — DEXTROSE 50 % IV SOLN
INTRAVENOUS | Status: AC
  Filled 2016-07-19: qty 50

## 2016-07-19 MED ORDER — PROPOFOL 10 MG/ML IV BOLUS
INTRAVENOUS | Status: DC | PRN
  Administered 2016-07-19 (×2): 20 mg via INTRAVENOUS
  Administered 2016-07-19: 30 mg via INTRAVENOUS

## 2016-07-19 MED ORDER — ACETAMINOPHEN 650 MG RE SUPP: 650.0000 mg | Freq: Four times a day (QID) | RECTAL | Status: DC | PRN

## 2016-07-19 MED ORDER — ACETAMINOPHEN 325 MG PO TABS: 650.0000 mg | ORAL_TABLET | Freq: Four times a day (QID) | ORAL | Status: DC | PRN

## 2016-07-19 MED ORDER — FUROSEMIDE 10 MG/ML IJ SOLN
INTRAMUSCULAR | Status: AC
  Filled 2016-07-19: qty 2

## 2016-07-19 MED ORDER — PROMETHAZINE HCL 25 MG/ML IJ SOLN: 6.2500 mg | INTRAMUSCULAR | Status: DC | PRN

## 2016-07-19 MED ORDER — BELLADONNA ALKALOIDS-OPIUM 16.2-60 MG RE SUPP
RECTAL | Status: AC
  Filled 2016-07-19: qty 1

## 2016-07-19 MED ORDER — BRIMONIDINE TARTRATE 0.15 % OP SOLN
1.0000 [drp] | Freq: Three times a day (TID) | OPHTHALMIC | Status: DC
  Administered 2016-07-19 (×3): 1 [drp] via OPHTHALMIC
  Filled 2016-07-19: qty 5

## 2016-07-19 MED ORDER — DEXTROSE 5 % IV SOLN
2.0000 g | INTRAVENOUS | Status: DC
  Filled 2016-07-19 (×2): qty 2

## 2016-07-19 MED ORDER — ONDANSETRON HCL 4 MG PO TABS: 4.0000 mg | ORAL_TABLET | Freq: Four times a day (QID) | ORAL | Status: DC | PRN

## 2016-07-19 MED ORDER — HYDROMORPHONE HCL 1 MG/ML IJ SOLN: 1.0000 mg | INTRAMUSCULAR | Status: DC | PRN

## 2016-07-19 MED ORDER — SODIUM CHLORIDE 0.9 % IV SOLN
INTRAVENOUS | Status: AC
  Administered 2016-07-19 (×2): via INTRAVENOUS
  Administered 2016-07-20: 100 mL/h via INTRAVENOUS

## 2016-07-19 MED ORDER — ONDANSETRON HCL 4 MG/2ML IJ SOLN: 4.0000 mg | Freq: Four times a day (QID) | INTRAMUSCULAR | Status: DC | PRN

## 2016-07-19 MED ORDER — DEXTROSE 50 % IV SOLN
25.0000 mL | Freq: Once | INTRAVENOUS | Status: AC
  Administered 2016-07-19: 25 mL via INTRAVENOUS

## 2016-07-19 MED ORDER — MEPERIDINE HCL 50 MG/ML IJ SOLN: 6.2500 mg | INTRAMUSCULAR | Status: DC | PRN

## 2016-07-19 MED ORDER — MORPHINE SULFATE (PF) 2 MG/ML IV SOLN
2.0000 mg | Freq: Two times a day (BID) | INTRAVENOUS | Status: DC
  Administered 2016-07-19: 2 mg via INTRAVENOUS
  Filled 2016-07-19: qty 1

## 2016-07-19 MED ORDER — LORAZEPAM 2 MG/ML IJ SOLN
0.5000 mg | Freq: Once | INTRAMUSCULAR | Status: AC
  Administered 2016-07-19: 0.5 mg via INTRAVENOUS
  Filled 2016-07-19: qty 1

## 2016-07-19 MED ORDER — ALBUTEROL SULFATE (2.5 MG/3ML) 0.083% IN NEBU
2.5000 mg | INHALATION_SOLUTION | Freq: Once | RESPIRATORY_TRACT | Status: AC
  Administered 2016-07-19: 18:00:00 via RESPIRATORY_TRACT
  Administered 2016-07-19: 2.5 mg via RESPIRATORY_TRACT
  Filled 2016-07-19 (×2): qty 3

## 2016-07-19 MED ORDER — METOPROLOL TARTRATE 5 MG/5ML IV SOLN
5.0000 mg | Freq: Four times a day (QID) | INTRAVENOUS | Status: DC
  Administered 2016-07-19 – 2016-07-20 (×4): 5 mg via INTRAVENOUS
  Filled 2016-07-19 (×4): qty 5

## 2016-07-19 MED ORDER — DEXTROSE 50 % IV SOLN
INTRAVENOUS | Status: DC | PRN
  Administered 2016-07-19: 1 via INTRAVENOUS

## 2016-07-19 MED ORDER — PIPERACILLIN-TAZOBACTAM IN DEX 2-0.25 GM/50ML IV SOLN
2.2500 g | Freq: Four times a day (QID) | INTRAVENOUS | Status: DC
  Administered 2016-07-19 – 2016-07-21 (×7): 2.25 g via INTRAVENOUS
  Filled 2016-07-19 (×9): qty 50

## 2016-07-19 MED ORDER — IOPAMIDOL (ISOVUE-300) INJECTION 61%
INTRAVENOUS | Status: DC | PRN
  Administered 2016-07-19: 25 mL via URETHRAL

## 2016-07-19 MED ORDER — LIDOCAINE 2% (20 MG/ML) 5 ML SYRINGE
INTRAMUSCULAR | Status: DC | PRN
  Administered 2016-07-19: 50 mg via INTRAVENOUS

## 2016-07-19 MED ORDER — SODIUM CHLORIDE 0.9 % IV BOLUS (SEPSIS)
1000.0000 mL | Freq: Once | INTRAVENOUS | Status: AC
  Administered 2016-07-19: 1000 mL via INTRAVENOUS

## 2016-07-19 MED ORDER — LACTULOSE ENEMA
300.0000 mL | Freq: Once | ORAL | Status: AC
  Administered 2016-07-19: 300 mL via RECTAL
  Filled 2016-07-19: qty 300

## 2016-07-19 MED ORDER — FENTANYL CITRATE (PF) 100 MCG/2ML IJ SOLN: 25.0000 ug | INTRAMUSCULAR | Status: DC | PRN

## 2016-07-19 MED ORDER — SODIUM CHLORIDE 0.9 % IV SOLN
1.0000 g | Freq: Once | INTRAVENOUS | Status: AC
  Administered 2016-07-19: 1 g via INTRAVENOUS
  Filled 2016-07-19: qty 10

## 2016-07-19 MED ORDER — DEXTROSE 5 % IV SOLN: 2.0000 g | INTRAVENOUS | Status: DC

## 2016-07-19 MED ORDER — FENTANYL CITRATE (PF) 100 MCG/2ML IJ SOLN
INTRAMUSCULAR | Status: AC
  Filled 2016-07-19: qty 2

## 2016-07-19 MED ORDER — METOPROLOL TARTRATE 5 MG/5ML IV SOLN
2.5000 mg | Freq: Two times a day (BID) | INTRAVENOUS | Status: DC
  Administered 2016-07-19: 2.5 mg via INTRAVENOUS
  Filled 2016-07-19: qty 5

## 2016-07-19 MED ORDER — HYDRALAZINE HCL 20 MG/ML IJ SOLN: 10.0000 mg | Freq: Four times a day (QID) | INTRAMUSCULAR | Status: DC | PRN

## 2016-07-19 MED ORDER — CALCIUM GLUCONATE 10 % IV SOLN
2.0000 g | Freq: Once | INTRAVENOUS | Status: AC
  Administered 2016-07-19: 2 g via INTRAVENOUS
  Filled 2016-07-19: qty 20

## 2016-07-19 MED ORDER — ALBUTEROL SULFATE (2.5 MG/3ML) 0.083% IN NEBU
INHALATION_SOLUTION | RESPIRATORY_TRACT | Status: AC
  Filled 2016-07-19: qty 3

## 2016-07-19 MED ORDER — PROPOFOL 10 MG/ML IV BOLUS
INTRAVENOUS | Status: AC
  Filled 2016-07-19: qty 20

## 2016-07-19 MED ORDER — METOPROLOL TARTRATE 5 MG/5ML IV SOLN
5.0000 mg | Freq: Four times a day (QID) | INTRAVENOUS | Status: DC
  Administered 2016-07-19: 5 mg via INTRAVENOUS
  Filled 2016-07-19: qty 5

## 2016-07-19 MED ORDER — INSULIN ASPART 100 UNIT/ML ~~LOC~~ SOLN
SUBCUTANEOUS | Status: DC | PRN
  Administered 2016-07-19: 10 [IU] via SUBCUTANEOUS

## 2016-07-19 MED ORDER — DEXTROSE 5 % IV SOLN
INTRAVENOUS | Status: DC | PRN
  Administered 2016-07-19: 2 g via INTRAVENOUS

## 2016-07-19 MED ORDER — INSULIN ASPART 100 UNIT/ML ~~LOC~~ SOLN
SUBCUTANEOUS | Status: AC
  Filled 2016-07-19: qty 1

## 2016-07-19 MED ORDER — LEVOTHYROXINE SODIUM 100 MCG IV SOLR
25.0000 ug | Freq: Every day | INTRAVENOUS | Status: DC
  Administered 2016-07-19 – 2016-07-20 (×2): 25 ug via INTRAVENOUS
  Filled 2016-07-19 (×3): qty 5

## 2016-07-19 MED ORDER — FUROSEMIDE 10 MG/ML IJ SOLN
INTRAMUSCULAR | Status: DC | PRN
  Administered 2016-07-19: 10 mg via INTRAMUSCULAR

## 2016-07-19 SURGICAL SUPPLY — 24 items
BAG URINE DRAINAGE (UROLOGICAL SUPPLIES) ×3 IMPLANT
BAG URO CATCHER STRL LF (MISCELLANEOUS) ×3 IMPLANT
BASKET LASER NITINOL 1.9FR (BASKET) IMPLANT
CATH FOLEY 2WAY SLVR  5CC 20FR (CATHETERS) ×2
CATH FOLEY 2WAY SLVR 5CC 20FR (CATHETERS) ×1 IMPLANT
CATH INTERMIT  6FR 70CM (CATHETERS) ×3 IMPLANT
CLOTH BEACON ORANGE TIMEOUT ST (SAFETY) ×3 IMPLANT
FIBER LASER FLEXIVA 1000 (UROLOGICAL SUPPLIES) IMPLANT
FIBER LASER FLEXIVA 365 (UROLOGICAL SUPPLIES) IMPLANT
FIBER LASER FLEXIVA 550 (UROLOGICAL SUPPLIES) IMPLANT
FIBER LASER TRAC TIP (UROLOGICAL SUPPLIES) IMPLANT
GLOVE BIOGEL M STRL SZ7.5 (GLOVE) ×3 IMPLANT
GOWN STRL REUS W/TWL LRG LVL3 (GOWN DISPOSABLE) ×6 IMPLANT
GUIDEWIRE ANG ZIPWIRE 038X150 (WIRE) ×3 IMPLANT
GUIDEWIRE STR DUAL SENSOR (WIRE) ×3 IMPLANT
IV NS 1000ML (IV SOLUTION) ×2
IV NS 1000ML BAXH (IV SOLUTION) ×1 IMPLANT
MANIFOLD NEPTUNE II (INSTRUMENTS) ×3 IMPLANT
PACK CYSTO (CUSTOM PROCEDURE TRAY) ×3 IMPLANT
STENT URET 6FRX26 CONTOUR (STENTS) ×3 IMPLANT
SYR CONTROL 10ML LL (SYRINGE) ×3 IMPLANT
TUBE FEEDING 8FR 16IN STR KANG (MISCELLANEOUS) ×3 IMPLANT
TUBING CONNECTING 10 (TUBING) ×2 IMPLANT
TUBING CONNECTING 10' (TUBING) ×1

## 2016-07-19 NOTE — Progress Notes (Signed)
  S: Planning to proceed with cysto / left retrogarde / stent in effort to decompress dominant kidney and improve hyperkalemia. Received lactulose enema, lasix earlier and now albuteral and insulin / glucose. Pt would likely not be dialysis candidate at his age, therefore renal decompression and conservative measures are most appropriate.  O: NAD, somnolent at present after benzo / opiod prior to holding room, son at bedside. Regular tachycardia Receiving albuterol, breath sounds non-coarse, but are labored. SNTND No c/c/e  I stat K 7 prior to albuterol / insulin.  A/P:  Proceed as planned with cysto / left stent as long as this is only realistic chance at reversing potentially lethal metabolic derangement in intermediate term. Pt and son understand immediate risks / goals, peri-op course, need for close stepdown / ICU monitoring post-procedure. Greatly appreciate acute comanagment by Dr. Lissa Hoard in effort to optimize peri-op course.

## 2016-07-19 NOTE — ED Notes (Signed)
RN at bedside will collect lab draw.

## 2016-07-19 NOTE — Brief Op Note (Signed)
07/18/2016 - 07/19/2016  7:20 PM  PATIENT:  Austin Jackson  83 y.o. male  PRE-OPERATIVE DIAGNOSIS:  Left hydronephrosis  POST-OPERATIVE DIAGNOSIS:  Left hydronephrosis  PROCEDURE:  Procedure(s): CYSTOSCOPY WITH RETROGRADE PYELOGRAM,  AND STENT PLACEMENT (Left)  SURGEON:  Surgeon(s) and Role:    * [name], MD - Primary  PHYSICIAN ASSISTANT:   ASSISTANTS: none   ANESTHESIA:   general  EBL:  No intake/output data recorded.  BLOOD ADMINISTERED:none  DRAINS: 80F foley to gravity.    LOCAL MEDICATIONS USED:  NONE  SPECIMEN:  No Specimen  DISPOSITION OF SPECIMEN:  N/A  COUNTS:  YES  TOURNIQUET:  * No tourniquets in log *  DICTATION: .Other Dictation: Dictation Number [id number]  PLAN OF CARE: Admit to inpatient   PATIENT DISPOSITION:  PACU - hemodynamically stable.   Delay start of Pharmacological VTE agent (>24hrs) due to surgical blood loss or risk of bleeding: yes

## 2016-07-19 NOTE — Anesthesia Postprocedure Evaluation (Signed)
Anesthesia Post Note  Patient: Austin Jackson  Procedure(s) Performed: Procedure(s) (LRB): CYSTOSCOPY WITH RETROGRADE PYELOGRAM,  AND STENT PLACEMENT (Left)  Patient location during evaluation: PACU Anesthesia Type: MAC Level of consciousness: obtunded/minimal responses Pain management: pain level controlled Vital Signs Assessment: post-procedure vital signs reviewed and stable Respiratory status: spontaneous breathing Cardiovascular status: stable Anesthetic complications: no Comments: Given total of ~1600 of crystalloid in OR and PACU. UOP ~ 140m/hr, dark bloody, but clearing. Given 113mfurosemide and UOP picked up dramatically and further cleared. CBG ~240 after glucose/insulin in preop. First CBG ~140 in PACU, 15 min later down to 120. Additional 1/2 amp D50 given and CBG written for q1hr in ICU for 6 hours. BMP written for @ 2130.        Last Vitals:  Vitals:   07/19/16 2030 07/19/16 2045  BP: 107/75 112/75  Pulse: 82 92  Resp: (!) 23   Temp: 36.3 C     Last Pain:  Vitals:   07/19/16 2030  TempSrc:   PainSc: [name]

## 2016-07-19 NOTE — Transfer of Care (Signed)
Immediate Anesthesia Transfer of Care Note  Patient: Austin Jackson  Procedure(s) Performed: Procedure(s): CYSTOSCOPY WITH RETROGRADE PYELOGRAM,  AND STENT PLACEMENT (Left)  Patient Location: PACU  Anesthesia Type:MAC  Level of Consciousness: sedated  Airway & Oxygen Therapy: Patient Spontanous Breathing and Patient connected to face mask oxygen  Post-op Assessment: Report given to RN and Post -op Vital signs reviewed and stable  Post vital signs: Reviewed and stable  Last Vitals:  Vitals:   07/19/16 1600 07/19/16 1700  BP: (!) 144/93 130/85  Pulse: 96 95  Resp: 17 18  Temp: 36.3 C     Last Pain:  Vitals:   07/19/16 1600  TempSrc: Oral  PainSc:          Complications: No apparent anesthesia complications

## 2016-07-19 NOTE — Progress Notes (Signed)
Pharmacy Antibiotic Note  Austin Jackson is a 83 y.o. male admitted on 07/18/2016 with Intra-abdominal infection.  Pharmacy has been consulted for zosyn dosing.  Plan: Zosyn 3.375gm iv x1, then 2.25gm iv q6hr  Height: 5\' 10"  (177.8 cm) Weight: 155 lb (70.3 kg) IBW/kg (Calculated) : 73  Temp (24hrs), Avg:98 F (36.7 C), Min:97.8 F (36.6 C), Max:98.1 F (36.7 C)   Recent Labs Lab 07/12/16 0809 07/18/16 1524 07/18/16 2212 07/18/16 2252  WBC  --  12.8* 11.3*  --   CREATININE 1.10 3.56* 3.63* 3.90*    Estimated Creatinine Clearance: 11.5 mL/min (by C-G formula based on SCr of 3.9 mg/dL (H)).    Allergies  Allergen Reactions  . Codeine Nausea And Vomiting  . Ultram [Tramadol Hcl] Other (See Comments)    Confusion     Antimicrobials this admission: Zosyn 1/10 >>  Dose adjustments this admission: -  Microbiology results: pending  Thank you for allowing pharmacy to be a part of this patient's care.  Nani Skillern Crowford 07/19/2016 2:09 AM

## 2016-07-19 NOTE — Progress Notes (Signed)
PROGRESS NOTE    Austin Jackson  [id number] DOB: 01/05/1920 DOA: 07/18/2016 PCP: Cleda Mccreedy, MD   Chief Complaint  Patient presents with  . Abnormal Lab  . Cancer     Brief Narrative:  HPI on 07/19/2016 by Dr. Gean Birchwood Austin Jackson is a 83 y.o. male with history of atrial fibrillation not on anticoagulation, CHF EF not known, hypothyroidism who had undergone recently bladder and renal biopsy for mass which turned out to be high-grade urothelial carcinoma and at the same time had stent placed with the right kidney was referred to the ER after patient's preop workup showed acute renal failure with hyperkalemia. Patient was scheduled to have surgery tomorrow. As per patient's son who is also a physician, patient has been feeling increasingly weak over the last 1 week with poor appetite. Creatinine has worsened from 1.1-3.5 with hyperkalemia. Patient denies any chest pain shortness of breath abdominal pain and diarrhea. At the time of my exam patient is mildly hypothermic with temperature of 62F and tachycardic.   CT of the abdomen and pelvis done this night shows new left renal pelvic mass versus blood clot with hydronephrosis. There is also possibility of perinephric stranding with infection. Assessment & Plan   Acute renal failure with hyperkalemia superimposed on CKD, stage secondary to New left hydronephrolosis -Creatinine baseline 1.3 -Currently 3.9, potassium 6.2 -Cannot give kayexalate as patient currently NPO, will order lactulose enema -CT abdomen pelvis noted Left hydronephrosis -Continue calcium gluconate and lasix -Will continue to monitor BMP -Urology consulted and appreciated, plan for cystoscopy with left retrograde/ ureteral stent placement -Placed on zosyn  SIRS -Presented with tachycardia, leukocytosis -Placed on zosyn -No clear source of infection.  May be secondary to the above -Continue to monitor -Lactic acid normal -No UA or CXR upon  admission  Atrial Fibrillation -Currently not on anticoagulation -Not rate controlled, possible due to the above -Uses metoprolol at home -Will order metoprolol 5mg  IV q6hrs -CHADSvasc   CHF -Unknown if systolic or diastolic, no echo in Epic -Monitor daily weights, intake/output -Lasix currently held  Hypothyroidism -Continue synthroid, IV form  Essential hypertension -Will order scheduled IV metoprolol and PRN IV hydralazine  Pain -likely secondary to the above -Will order dilaudid PRN  Chronic normocytic Anemia -Hemoglobin appears to be close to baseline, currently 9.1 -Continue to monitor CBC  DVT Prophylaxis  SCDs  Code Status: DNR  Family Communication: None at bedside  Disposition Plan: Admitted, continue to monitor in stepdown  Consultants Urology  Procedures  None  Antibiotics   Anti-infectives    Start     Dose/Rate Route Frequency Ordered Stop   07/19/16 1200  piperacillin-tazobactam (ZOSYN) IVPB 2.25 g     2.25 g 100 mL/hr over 30 Minutes Intravenous Every 6 hours 07/19/16 0156     07/19/16 0200  piperacillin-tazobactam (ZOSYN) IVPB 3.375 g     3.375 g 100 mL/hr over 30 Minutes Intravenous  Once 07/19/16 0156 07/19/16 0315      Subjective:   Austin Jackson seen and examined today.  Continues to complain of pain. Denies chest pain, shortness of breath. Does feel anxious. Currently denies any abdominal pain, unless his abdomen is pushed. Denies any nausea or vomiting, diarrhea or constipation.    Objective:   Vitals:   07/19/16 0600 07/19/16 0630 07/19/16 0658 07/19/16 0800  BP: 134/82 130/99  (!) 171/92  Pulse:    (!) 129  Resp: 18 (!) 27  16  Temp:   98 F (  36.7 C) 98.4 F (36.9 C)  TempSrc:   Oral Oral  SpO2:    97%  Weight:   78.8 kg (173 lb 11.6 oz)   Height:   5\' 10"  (1.778 m)     Intake/Output Summary (Last 24 hours) at 07/19/16 0809 Last data filed at 07/19/16 0700  Gross per 24 hour  Intake           163.33 ml  Output               900 ml  Net          -736.67 ml   Filed Weights   07/18/16 2303 07/19/16 0658  Weight: 70.3 kg (155 lb) 78.8 kg (173 lb 11.6 oz)    Exam  General: Well developed, well nourished, mild distress, appears stated age  HEENT: NCAT, mucous membranes moist.   Cardiovascular: S1 S2 auscultated, tachycardic, irregular  Respiratory: Clear to auscultation bilaterally with equal chest rise  Abdomen: Soft, diffusely TTP, nondistended, + bowel sounds  Extremities: warm dry without cyanosis clubbing or edema  Neuro: AAOx3, nonfocal  Psych: Normal affect and demeanor with intact judgement and insight   Data Reviewed: I have personally reviewed following labs and imaging studies  CBC:  Recent Labs Lab 07/13/16 0504 07/18/16 1524 07/18/16 2212 07/18/16 2252 07/19/16 0551  WBC  --  12.8* 11.3*  --  12.5*  NEUTROABS  --   --  9.2*  --   --   HGB 8.9* 9.7* 9.0* 9.2* 9.1*  HCT 26.7* 29.4* 26.8* 27.0* 26.5*  MCV  --  92.2 92.7  --  92.0  PLT  --  294 275  --  [id number]   Basic Metabolic Panel:  Recent Labs Lab 07/18/16 1524 07/18/16 2212 07/18/16 2252 07/19/16 0551  NA 131* 128* 131* 131*  K 6.3* 5.8* 5.8* 6.2*  CL 100* 100* 101 104  CO2 22 22  --  19*  GLUCOSE 123* 153* 148* 136*  BUN 74* 78* 73* 76*  CREATININE 3.56* 3.63* 3.90* 3.92*  CALCIUM 8.4* 8.1*  --  8.1*   GFR: Estimated Creatinine Clearance: 11.9 mL/min (by C-G formula based on SCr of 3.92 mg/dL (H)). Liver Function Tests:  Recent Labs Lab 07/18/16 2212  AST 18  ALT 10*  ALKPHOS 61  BILITOT 0.7  PROT 6.8  ALBUMIN 2.5*   No results for input(s): LIPASE, AMYLASE in the last 168 hours. No results for input(s): AMMONIA in the last 168 hours. Coagulation Profile: No results for input(s): INR, PROTIME in the last 168 hours. Cardiac Enzymes: No results for input(s): CKTOTAL, CKMB, CKMBINDEX, TROPONINI in the last 168 hours. BNP (last 3 results) No results for input(s): PROBNP in the last 8760  hours. HbA1C: No results for input(s): HGBA1C in the last 72 hours. CBG:  Recent Labs Lab 07/19/16 0749  GLUCAP 192*   Lipid Profile: No results for input(s): CHOL, HDL, LDLCALC, TRIG, CHOLHDL, LDLDIRECT in the last 72 hours. Thyroid Function Tests:  Recent Labs  07/19/16 0551  TSH 1.520   Anemia Panel: No results for input(s): VITAMINB12, FOLATE, FERRITIN, TIBC, IRON, RETICCTPCT in the last 72 hours. Urine analysis: No results found for: COLORURINE, APPEARANCEUR, LABSPEC, PHURINE, GLUCOSEU, HGBUR, BILIRUBINUR, KETONESUR, PROTEINUR, UROBILINOGEN, NITRITE, LEUKOCYTESUR Sepsis Labs: @LABRCNTIP (procalcitonin:4,lacticidven:4)  )No results found for this or any previous visit (from the past 240 hour(s)).    Radiology Studies: Ct Abdomen Pelvis Wo Contrast  Result Date: 07/18/2016 CLINICAL DATA:  Elevated creatinine over the last 6  days. Elevated potassium. Scheduled for nephrectomy in 2 days. Lower abdominal pain on the left. Hematuria. Right-sided stent. Good urine output. History of bladder cancer. EXAM: CT ABDOMEN AND PELVIS WITHOUT CONTRAST TECHNIQUE: Multidetector CT imaging of the abdomen and pelvis was performed following the standard protocol without IV contrast. COMPARISON:  None. FINDINGS: Lower chest: Areas of atelectasis and scarring in the lung bases. Cardiac enlargement. Calcification in the aortic and mitral valves is well is coronary arteries. Hepatobiliary: No focal liver abnormality is seen. No gallstones, gallbladder wall thickening, or biliary dilatation. Pancreas: Pancreas is atrophic. No inflammatory change or fluid collection. Spleen: Normal in size without focal abnormality. Adrenals/Urinary Tract: No adrenal gland nodules. Right kidney demonstrates parenchymal atrophy. Prominence of the extrarenal pelvis with a ureteral stent in place. Proximal pigtail is within the renal pelvis. Distal pigtail is in the distal ureter. Calcification along the right renal pelvic  wall. Subcentimeter exophytic lesions likely representing hemorrhagic cysts. Solid lesions not entirely excluded. Left kidney demonstrates diffuse parenchymal atrophy. There is hydronephrosis and hydroureter down to the level of the bladder. No obstructing stones are demonstrated. Within the dilated renal pelvis, there is a poorly defined hyperdense structure which could represent blood clot or a polypoid lesion. There is stranding around the left kidney which could be due to obstruction or infection. Subcentimeter exophytic lesion probably representing hemorrhagic cyst. Bladder is decompressed with a Foley catheter and cannot be well evaluated. Stomach/Bowel: Stomach is within normal limits. Appendix is not identified. No evidence of bowel wall thickening, distention, or inflammatory changes. Vascular/Lymphatic: Extensive calcification throughout the abdominal aorta and branch vessels. No aneurysm. Inferior vena cava is unremarkable. No significant lymphadenopathy. Reproductive: The Foley catheter balloon appears to be at least partially within the prostate gland. Prostate enlargement, measuring 5.2 cm diameter. Other: Supraumbilical midline abdominal wall hernia containing fat. Atrophy of the abdominal wall musculature. No free air or free fluid. Musculoskeletal: Thoracolumbar scoliosis convex towards the right. Extensive degenerative changes throughout the spine. Compression of T11 vertebra. IMPRESSION: 1. Atrophy of the right kidney. Dilated extrarenal pelvis with ureteral stent in place. Distal pigtail of the stent is in the distal right ureter. Subcentimeter exophytic lesions probably representing hemorrhagic cysts. 2. Atrophy of the left kidney. Left renal hydronephrosis and hydroureter down to the level of the bladder. No obstructing stones demonstrated. Hyperdense mass or blood clot demonstrated in the left renal pelvis. Stranding around the left kidney may indicate obstruction or infection. Probable  hemorrhagic cyst. 3. Bladder is decompressed with a Foley catheter and cannot be evaluated. At least part of the Foley catheter balloon appears to be within the prostate gland. Prostate enlargement. 4. Midline supraumbilical abdominal wall hernia containing fat. Electronically Signed   By: Lucienne Capers M.D.   On: 07-Jan-202018 23:21     Scheduled Meds: . albuterol  2.5 mg Nebulization Once  . brimonidine  1 drop Both Eyes TID  . calcium gluconate  1 g Intravenous Once  . latanoprost  1 drop Both Eyes QHS  . levothyroxine  25 mcg Intravenous Daily  . metoprolol  5 mg Intravenous Q6H  .  morphine injection  2 mg Intravenous BID  . piperacillin-tazobactam (ZOSYN)  IV  2.25 g Intravenous Q6H   Continuous Infusions: . sodium chloride 100 mL/hr at 07/19/16 0522     LOS: 1 day   Time Spent in minutes   30 minutes  Garrett Mitchum D.O. on 07/19/2016 at 8:09 AM  Between 7am to 7pm - Pager - [phone]  After  7pm go to [email or web address] - password [email or web address]  And look for the night coverage person covering for me after hours  [facility] Office  [phone]

## 2016-07-19 NOTE — H&P (Addendum)
History and Physical    GREGORIE BEHLER [id number] DOB: 06-12-1920 DOA: 07/18/2016  PCP: Cleda Mccreedy, MD  Patient coming from: Home.  Chief Complaint: Abnormal labs.  HPI: Austin Jackson is a 83 y.o. male with history of atrial fibrillation not on anticoagulation, CHF EF not known, hypothyroidism who had undergone recently bladder and renal biopsy for mass which turned out to be high-grade urothelial carcinoma and at the same time had stent placed with the right kidney was referred to the ER after patient's preop workup showed acute renal failure with hyperkalemia. Patient was scheduled to have surgery tomorrow. As per patient's son who is also a physician, patient has been feeling increasingly weak over the last 1 week with poor appetite. Creatinine has worsened from 1.1-3.5 with hyperkalemia. Patient denies any chest pain shortness of breath abdominal pain and diarrhea. At the time of my exam patient is mildly hypothermic with temperature of 73F and tachycardic.   CT of the abdomen and pelvis done this night shows new left renal pelvic mass versus blood clot with hydronephrosis. There is also possibility of perinephric stranding with infection.  ED Course: Patient was given fluid bolus and Lasix along with the fluids for hyperkalemia. Calcium gluconate was given. Urologist Dr. Tresa Moore was notified.  Review of Systems: As per HPI, rest all negative.   Past Medical History:  Diagnosis Date  . Bilateral lower extremity edema   . Bladder cancer ([place])   . Chronic atrial fibrillation ([place])    dx 2002--  s/p cardioversion approx 2006 or 2007-- per pt last cardiologist visit at that time-- currently be followed by pcp dr Theodis Blaze in [place]  . CKD (chronic kidney disease), stage III   . Dysrhythmia   . Foley catheter in place    due to gross hematuria w/ blood clots  . Full dentures   . GERD (gastroesophageal reflux disease)   . Glaucoma, both eyes   . Gross hematuria   .  History of BPH   . History of GI bleed    2013--  per pt unable to find reason but had been on blood thinner,  was transfusion's  . History of kidney stones   . Hypertension   . Hypothyroidism due to amiodarone   . Iron deficiency anemia   . Macular degeneration of both eyes   . OA (osteoarthritis)   . PONV (postoperative nausea and vomiting)   . Renal mass, right    renal pelvis    Past Surgical History:  Procedure Laterality Date  . CARDIOVERSION  2002  . COLONOSCOPY  yrs ago  . EXTRACORPOREAL SHOCK WAVE LITHOTRIPSY  1970's  . EYE SURGERY Left    for glaucoma  . EYE SURGERY Bilateral    cataract extraction with IOL  . INGUINAL HERNIA REPAIR Bilateral 2007 and 1980's  . TRANSURETHRAL RESECTION OF BLADDER TUMOR Right 07/12/2016   Procedure: TRANSURETHRAL RESECTION OF BLADDER TUMOR (TURBT)/ BILATERAL RETROGRADE PYLEOGRAM/ RIGHT DIAGNOSTIC URETEROSCOPY;  Surgeon: [name], MD;  Location: [facility];  Service: Urology;  Laterality: Right;  . TRANSURETHRAL RESECTION OF PROSTATE  2002     reports that he quit smoking about 32 years ago. His smoking use included Cigarettes. He quit after 30.00 years of use. He has never used smokeless tobacco. He reports that he drinks alcohol. He reports that he does not use drugs.  Allergies  Allergen Reactions  . Codeine Nausea And Vomiting  . Ultram [Tramadol Hcl] Other (See Comments)  Confusion     Family History  Problem Relation Age of Onset  . Kidney cancer Neg Hx   . Diabetes Mellitus II Neg Hx   . CAD Neg Hx     Prior to Admission medications   Medication Sig Start Date End Date Taking? Authorizing Provider  acetaminophen (TYLENOL) 500 MG tablet Take 500-1,000 mg by mouth every 6 (six) hours as needed for mild pain (depends on pain).    Yes Historical Provider, MD  ALPHAGAN P 0.1 % SOLN Place 1 drop into both eyes 2 (two) times daily. 07/01/16  Yes Historical Provider, MD  calcium carbonate (TUMS - DOSED IN  MG ELEMENTAL CALCIUM) 500 MG chewable tablet Chew 1 tablet by mouth as needed for indigestion or heartburn.   Yes Historical Provider, MD  Ferrous Sulfate (IRON) 325 (65 Fe) MG TABS Take 1 tablet by mouth 2 (two) times daily.   Yes Historical Provider, MD  furosemide (LASIX) 20 MG tablet Take 20 mg by mouth every morning.   Yes Historical Provider, MD  gabapentin (NEURONTIN) 300 MG capsule Take 300 mg by mouth at bedtime.   Yes Historical Provider, MD  latanoprost (XALATAN) 0.005 % ophthalmic solution Place 1 drop into both eyes at bedtime.    Yes Historical Provider, MD  levothyroxine (SYNTHROID, LEVOTHROID) 50 MCG tablet Take 50 mcg by mouth at bedtime.   Yes Historical Provider, MD  metoprolol tartrate (LOPRESSOR) 25 MG tablet Take 12.5 mg by mouth 3 (three) times daily.   Yes Historical Provider, MD  Multiple Vitamins-Minerals (ICAPS PO) Take 2 capsules by mouth daily.   Yes Historical Provider, MD  tamsulosin (FLOMAX) 0.4 MG CAPS capsule Take 0.4 mg by mouth daily after supper.   Yes Historical Provider, MD    Physical Exam: Vitals:   07/19/16 0158 07/19/16 0200 07/19/16 0230 07/19/16 0300  BP: 152/86 145/79 126/69 116/69  Pulse: (!) 131 (!) 126 114 115  Resp: 20 21 23 19   Temp:      TempSrc:      SpO2: 96% 97% 97% 97%  Weight:      Height:          Constitutional: Moderately built and nourished. Vitals:   07/19/16 0158 07/19/16 0200 07/19/16 0230 07/19/16 0300  BP: 152/86 145/79 126/69 116/69  Pulse: (!) 131 (!) 126 114 115  Resp: 20 21 23 19   Temp:      TempSrc:      SpO2: 96% 97% 97% 97%  Weight:      Height:       Eyes: Anicteric no pallor. ENMT: No discharge from the ears eyes nose and mouth. Neck: No mass felt. No neck rigidity. Respiratory: No rhonchi or crepitations. Cardiovascular: S1-S2 heard. No murmurs appreciated. Abdomen: Soft nontender bowel sounds present. Musculoskeletal: No edema. No joint effusion. Skin: No rash. Skin appears warm. Neurologic:  Alert awake oriented to time place and person. Moves all extremities. Psychiatric: Appears normal. Normal affect.   Labs on Admission: I have personally reviewed following labs and imaging studies  CBC:  Recent Labs Lab 07/12/16 0809 07/13/16 0504 07/18/16 1524 07/18/16 2212 07/18/16 2252  WBC  --   --  12.8* 11.3*  --   NEUTROABS  --   --   --  9.2*  --   HGB 11.2* 8.9* 9.7* 9.0* 9.2*  HCT 33.0* 26.7* 29.4* 26.8* 27.0*  MCV  --   --  92.2 92.7  --   PLT  --   --  294 275  --    Basic Metabolic Panel:  Recent Labs Lab 07/12/16 0809 07/18/16 1524 07/18/16 2212 07/18/16 2252  NA 138 131* 128* 131*  K 4.3 6.3* 5.8* 5.8*  CL 104 100* 100* 101  CO2  --  22 22  --   GLUCOSE 126* 123* 153* 148*  BUN 25* 74* 78* 73*  CREATININE 1.10 3.56* 3.63* 3.90*  CALCIUM  --  8.4* 8.1*  --    GFR: Estimated Creatinine Clearance: 11.5 mL/min (by C-G formula based on SCr of 3.9 mg/dL (H)). Liver Function Tests:  Recent Labs Lab 07/18/16 2212  AST 18  ALT 10*  ALKPHOS 61  BILITOT 0.7  PROT 6.8  ALBUMIN 2.5*   No results for input(s): LIPASE, AMYLASE in the last 168 hours. No results for input(s): AMMONIA in the last 168 hours. Coagulation Profile: No results for input(s): INR, PROTIME in the last 168 hours. Cardiac Enzymes: No results for input(s): CKTOTAL, CKMB, CKMBINDEX, TROPONINI in the last 168 hours. BNP (last 3 results) No results for input(s): PROBNP in the last 8760 hours. HbA1C: No results for input(s): HGBA1C in the last 72 hours. CBG: No results for input(s): GLUCAP in the last 168 hours. Lipid Profile: No results for input(s): CHOL, HDL, LDLCALC, TRIG, CHOLHDL, LDLDIRECT in the last 72 hours. Thyroid Function Tests: No results for input(s): TSH, T4TOTAL, FREET4, T3FREE, THYROIDAB in the last 72 hours. Anemia Panel: No results for input(s): VITAMINB12, FOLATE, FERRITIN, TIBC, IRON, RETICCTPCT in the last 72 hours. Urine analysis: No results found for:  COLORURINE, APPEARANCEUR, LABSPEC, PHURINE, GLUCOSEU, HGBUR, BILIRUBINUR, KETONESUR, PROTEINUR, UROBILINOGEN, NITRITE, LEUKOCYTESUR Sepsis Labs: @LABRCNTIP (procalcitonin:4,lacticidven:4) )No results found for this or any previous visit (from the past 240 hour(s)).   Radiological Exams on Admission: Ct Abdomen Pelvis Wo Contrast  Result Date: 07/18/2016 CLINICAL DATA:  Elevated creatinine over the last 6 days. Elevated potassium. Scheduled for nephrectomy in 2 days. Lower abdominal pain on the left. Hematuria. Right-sided stent. Good urine output. History of bladder cancer. EXAM: CT ABDOMEN AND PELVIS WITHOUT CONTRAST TECHNIQUE: Multidetector CT imaging of the abdomen and pelvis was performed following the standard protocol without IV contrast. COMPARISON:  None. FINDINGS: Lower chest: Areas of atelectasis and scarring in the lung bases. Cardiac enlargement. Calcification in the aortic and mitral valves is well is coronary arteries. Hepatobiliary: No focal liver abnormality is seen. No gallstones, gallbladder wall thickening, or biliary dilatation. Pancreas: Pancreas is atrophic. No inflammatory change or fluid collection. Spleen: Normal in size without focal abnormality. Adrenals/Urinary Tract: No adrenal gland nodules. Right kidney demonstrates parenchymal atrophy. Prominence of the extrarenal pelvis with a ureteral stent in place. Proximal pigtail is within the renal pelvis. Distal pigtail is in the distal ureter. Calcification along the right renal pelvic wall. Subcentimeter exophytic lesions likely representing hemorrhagic cysts. Solid lesions not entirely excluded. Left kidney demonstrates diffuse parenchymal atrophy. There is hydronephrosis and hydroureter down to the level of the bladder. No obstructing stones are demonstrated. Within the dilated renal pelvis, there is a poorly defined hyperdense structure which could represent blood clot or a polypoid lesion. There is stranding around the left kidney  which could be due to obstruction or infection. Subcentimeter exophytic lesion probably representing hemorrhagic cyst. Bladder is decompressed with a Foley catheter and cannot be well evaluated. Stomach/Bowel: Stomach is within normal limits. Appendix is not identified. No evidence of bowel wall thickening, distention, or inflammatory changes. Vascular/Lymphatic: Extensive calcification throughout the abdominal aorta and branch vessels. No aneurysm. Inferior vena  cava is unremarkable. No significant lymphadenopathy. Reproductive: The Foley catheter balloon appears to be at least partially within the prostate gland. Prostate enlargement, measuring 5.2 cm diameter. Other: Supraumbilical midline abdominal wall hernia containing fat. Atrophy of the abdominal wall musculature. No free air or free fluid. Musculoskeletal: Thoracolumbar scoliosis convex towards the right. Extensive degenerative changes throughout the spine. Compression of T11 vertebra. IMPRESSION: 1. Atrophy of the right kidney. Dilated extrarenal pelvis with ureteral stent in place. Distal pigtail of the stent is in the distal right ureter. Subcentimeter exophytic lesions probably representing hemorrhagic cysts. 2. Atrophy of the left kidney. Left renal hydronephrosis and hydroureter down to the level of the bladder. No obstructing stones demonstrated. Hyperdense mass or blood clot demonstrated in the left renal pelvis. Stranding around the left kidney may indicate obstruction or infection. Probable hemorrhagic cyst. 3. Bladder is decompressed with a Foley catheter and cannot be evaluated. At least part of the Foley catheter balloon appears to be within the prostate gland. Prostate enlargement. 4. Midline supraumbilical abdominal wall hernia containing fat. Electronically Signed   By: Lucienne Capers M.D.   On: November 17, 202018 23:21    EKG: Independently reviewed. A. fib with rate around 100.  Assessment/Plan Principal Problem:   Acute renal failure  (ARF) ([facility]) Active Problems:   Bladder cancer ([facility])   Hyperkalemia   Hydronephrosis   Normocytic normochromic anemia   Hypothyroidism   Hypertension   Acute renal failure ([facility])   SIRS (systemic inflammatory response syndrome) ([place])    1. Acute renal failure with hyperkalemia - cause not clear but patient had recent IV dye exposure which could contribute to the renal failure. Patient also has been having poor appetite. Check FENa. Continue to hydrate. Patient did receive Lasix for hyperkalemia. We'll recheck metabolic panel closely follow intake and output. Addendum - patient has left sided hydronephrosis, which could also be contributing to the renal failure.. Urologist is planning to take for stent placement in the morning. 2. SIRS - during my examination patient was mildly hypothermic and tachycardic. Patient also had chills and rigors. Blood cultures, urine cultures have been ordered along with lactate levels procalcitonin levels. I'm placing patient on empiric antibiotics for pyelonephritis. Has had recent right sided ureteral stent placed. 3. History of A. fib not on anticoagulation - I have placed patient on scheduled dose of IV metoprolol until patient can take orally. 4. History of CHF - EF unknown. Lasix on hold due to renal failure. 5. Hypothyroidism on Synthroid - which will be dosed IV. 6. Anemia - follow CBC. 7. Addendum - Urothelial Carcinoma - per Urology.   DVT prophylaxis: SCDs. Code Status: DO NOT RESUSCITATE.  Family Communication: [name].  Disposition Plan: To be determined.  Consults called: Urologist.  Admission status: Inpatient.    [name] MD [facility] Pager [phone].  If 7PM-7AM, please contact night-coverage [email or web address] Password [email or web address]  07/19/2016, 3:35 AM

## 2016-07-19 NOTE — Anesthesia Preprocedure Evaluation (Addendum)
Anesthesia Evaluation  Patient identified by MRN, date of birth, ID band Patient awake    Reviewed: Allergy & Precautions, NPO status , Patient's Chart, lab work & pertinent test results, reviewed documented beta blocker date and time   History of Anesthesia Complications Negative for: history of anesthetic complications  Airway Mallampati: I  TM Distance: >3 FB Neck ROM: Full    Dental  (+) Edentulous Upper, Edentulous Lower   Pulmonary COPD, former smoker,    breath sounds clear to auscultation       Cardiovascular hypertension, Pt. on medications and Pt. on home beta blockers + dysrhythmias Atrial Fibrillation  Rhythm:Irregular Rate:Normal     Neuro/Psych negative neurological ROS     GI/Hepatic GERD  Medicated and Controlled,  Endo/Other  Hypothyroidism   Renal/GU Renal diseasenegative Renal ROS     Musculoskeletal  (+) Arthritis , Osteoarthritis,    Abdominal   Peds  Hematology negative hematology ROS (+) anemia ,   Anesthesia Other Findings   Reproductive/Obstetrics                             Anesthesia Physical  Anesthesia Plan  ASA: IV and emergent  Anesthesia Plan: MAC   Post-op Pain Management:    Induction:   Airway Management Planned:   Additional Equipment:   Intra-op Plan:   Post-operative Plan:   Informed Consent: I have reviewed the patients History and Physical, chart, labs and discussed the procedure including the risks, benefits and alternatives for the proposed anesthesia with the patient or authorized representative who has indicated his/her understanding and acceptance.   Dental advisory given  Plan Discussed with: CRNA  Anesthesia Plan Comments: (Pt with critical potassium before 6am. Apparently only given one lactulose enema. Arrived to preop obtunded due to morphine and ativan given earlier. Discussed with Dr. Tresa Moore. Checked istat in preop that  revealed K+ of 7.1. Given albuterol neb and an amp of D50 with 10 units insulin. BMP sent ~15 minutes later revealing K+ 5. Given 2 grams of calcium gluconate and taken to the OR.)      Anesthesia Quick Evaluation

## 2016-07-19 NOTE — ED Notes (Signed)
CBG 129 

## 2016-07-20 ENCOUNTER — Encounter (HOSPITAL_COMMUNITY): Payer: Self-pay | Admitting: Urology

## 2016-07-20 ENCOUNTER — Encounter (HOSPITAL_COMMUNITY)
Admission: EM | Disposition: A | Discharge status: Home Health | Payer: Self-pay | Source: Home / Self Care | Attending: Internal Medicine

## 2016-07-20 ENCOUNTER — Encounter (HOSPITAL_COMMUNITY): Payer: Self-pay | Admitting: Anesthesiology

## 2016-07-20 ENCOUNTER — Inpatient Hospital Stay (HOSPITAL_COMMUNITY): Admission: RE | Admit: 2016-07-20 | Payer: Medicare Other | Source: Ambulatory Visit | Admitting: Urology

## 2016-07-20 LAB — BASIC METABOLIC PANEL
ANION GAP: 6 (ref 5–15)
Anion gap: 6 (ref 5–15)
BUN: 69 mg/dL — AB (ref 6–20)
BUN: 64 mg/dL — AB (ref 6–20)
CHLORIDE: 107 mmol/L (ref 101–111)
CO2: 21 mmol/L — AB (ref 22–32)
CO2: 21 mmol/L — ABNORMAL LOW (ref 22–32)
Calcium: 8.2 mg/dL — ABNORMAL LOW (ref 8.9–10.3)
Calcium: 7.9 mg/dL — ABNORMAL LOW (ref 8.9–10.3)
Chloride: 106 mmol/L (ref 101–111)
Creatinine, Ser: 3.41 mg/dL — ABNORMAL HIGH (ref 0.61–1.24)
Creatinine, Ser: 3.04 mg/dL — ABNORMAL HIGH (ref 0.61–1.24)
GFR calc Af Amer: 16 mL/min — ABNORMAL LOW (ref >60)
GFR calc Af Amer: 19 mL/min — ABNORMAL LOW (ref >60)
GFR calc non Af Amer: 14 mL/min — ABNORMAL LOW (ref >60)
GFR, EST NON AFRICAN AMERICAN: 16 mL/min — AB (ref >60)
GLUCOSE: 140 mg/dL — AB (ref 65–99)
Glucose, Bld: 110 mg/dL — ABNORMAL HIGH (ref 65–99)
POTASSIUM: 4.7 mmol/L (ref 3.5–5.1)
Potassium: 4.3 mmol/L (ref 3.5–5.1)
SODIUM: 133 mmol/L — AB (ref 135–145)
Sodium: 134 mmol/L — ABNORMAL LOW (ref 135–145)

## 2016-07-20 LAB — GLUCOSE, CAPILLARY
GLUCOSE-CAPILLARY: 85 mg/dL (ref 65–99)
GLUCOSE-CAPILLARY: 170 mg/dL — AB (ref 65–99)
GLUCOSE-CAPILLARY: 266 mg/dL — AB (ref 65–99)
Glucose-Capillary: 115 mg/dL — ABNORMAL HIGH (ref 65–99)
Glucose-Capillary: 109 mg/dL — ABNORMAL HIGH (ref 65–99)
Glucose-Capillary: 193 mg/dL — ABNORMAL HIGH (ref 65–99)

## 2016-07-20 LAB — POCT I-STAT 4, (NA,K, GLUC, HGB,HCT)
Glucose, Bld: 156 mg/dL — ABNORMAL HIGH (ref 65–99)
HCT: 28 % — ABNORMAL LOW (ref 39.0–52.0)
Hemoglobin: 9.5 g/dL — ABNORMAL LOW (ref 13.0–17.0)
POTASSIUM: 7.1 mmol/L — AB (ref 3.5–5.1)
SODIUM: 135 mmol/L (ref 135–145)

## 2016-07-20 LAB — SODIUM, URINE, RANDOM: Sodium, Ur: 112 mmol/L

## 2016-07-20 LAB — CBC
HCT: 23.7 % — ABNORMAL LOW (ref 39.0–52.0)
Hemoglobin: 8 g/dL — ABNORMAL LOW (ref 13.0–17.0)
MCH: 31.1 pg (ref 26.0–34.0)
MCHC: 33.8 g/dL (ref 30.0–36.0)
MCV: 92.2 fL (ref 78.0–100.0)
PLATELETS: 242 10*3/uL (ref 150–400)
RBC: 2.57 MIL/uL — AB (ref 4.22–5.81)
RDW: 14.6 % (ref 11.5–15.5)
WBC: 9.6 10*3/uL (ref 4.0–10.5)

## 2016-07-20 LAB — CREATININE, URINE, RANDOM: CREATININE, URINE: 18.17 mg/dL

## 2016-07-20 SURGERY — NEPHROURETERECTOMY, ROBOT-ASSISTED, LAPAROSCOPIC
Anesthesia: General | Laterality: Right

## 2016-07-20 MED ORDER — LEVOTHYROXINE SODIUM 50 MCG PO TABS
50.0000 ug | ORAL_TABLET | Freq: Every day | ORAL | Status: DC
  Administered 2016-07-20 – 2016-07-22 (×3): 50 ug via ORAL
  Filled 2016-07-20 (×3): qty 1

## 2016-07-20 MED ORDER — TAMSULOSIN HCL 0.4 MG PO CAPS
0.4000 mg | ORAL_CAPSULE | Freq: Every day | ORAL | Status: DC
  Administered 2016-07-20 – 2016-07-22 (×3): 0.4 mg via ORAL
  Filled 2016-07-20 (×3): qty 1

## 2016-07-20 MED ORDER — GABAPENTIN 300 MG PO CAPS
300.0000 mg | ORAL_CAPSULE | Freq: Every day | ORAL | Status: DC
  Administered 2016-07-20 – 2016-07-22 (×3): 300 mg via ORAL
  Filled 2016-07-20 (×3): qty 1

## 2016-07-20 MED ORDER — HYDROMORPHONE HCL 1 MG/ML IJ SOLN: 0.5000 mg | INTRAMUSCULAR | Status: DC | PRN

## 2016-07-20 MED ORDER — IRON 325 (65 FE) MG PO TABS: 1.0000 | ORAL_TABLET | Freq: Two times a day (BID) | ORAL | Status: DC

## 2016-07-20 MED ORDER — FERROUS SULFATE 325 (65 FE) MG PO TABS
325.0000 mg | ORAL_TABLET | Freq: Two times a day (BID) | ORAL | Status: DC
  Administered 2016-07-20 – 2016-07-23 (×6): 325 mg via ORAL
  Filled 2016-07-20 (×6): qty 1

## 2016-07-20 MED ORDER — METOPROLOL TARTRATE 12.5 MG HALF TABLET
12.5000 mg | ORAL_TABLET | Freq: Three times a day (TID) | ORAL | Status: DC
  Administered 2016-07-20 – 2016-07-23 (×9): 12.5 mg via ORAL
  Filled 2016-07-20 (×9): qty 1

## 2016-07-20 NOTE — Progress Notes (Addendum)
PROGRESS NOTE    Austin Jackson  [id number] DOB: 21-Jan-1920 DOA: 07/18/2016 PCP: Austin Mccreedy, MD   Chief Complaint  Patient presents with  . Abnormal Lab  . Cancer     Brief Narrative:  HPI on 07/19/2016 by Dr. Gean Jackson Austin Jackson is a 83 y.o. male with history of atrial fibrillation not on anticoagulation, CHF EF not known, hypothyroidism who had undergone recently bladder and renal biopsy for mass which turned out to be high-grade urothelial carcinoma and at the same time had stent placed with the right kidney was referred to the ER after patient's preop workup showed acute renal failure with hyperkalemia. Patient was scheduled to have surgery tomorrow. As per patient's son who is also a physician, patient has been feeling increasingly weak over the last 1 week with poor appetite. Creatinine has worsened from 1.1-3.5 with hyperkalemia. Patient denies any chest pain shortness of breath abdominal pain and diarrhea. At the time of my exam patient is mildly hypothermic with temperature of 69F and tachycardic.   CT of the abdomen and pelvis done this night shows new left renal pelvic mass versus blood clot with hydronephrosis. There is also possibility of perinephric stranding with infection. Assessment & Plan   Acute renal failure with hyperkalemia superimposed on CKD, stage secondary to New left hydronephrolosis -Creatinine baseline 1.3, peaked creatinine at 3.92 -Hyperkalemia resolved, K 4.3 today -Creatinine today 3.04 -CT abdomen pelvis noted Left hydronephrosis -Will continue to monitor BMP -Urology consulted and appreciated, s/p cystoscopy with retrograde pyelogram, stent placement (L) -Currently on zosyn -Continue to monitor intake/output  SIRS -Presented with tachycardia, leukocytosis -Leukocytosis improved -Blood cultures show to growth to date -Placed on zosyn -No clear source of infection.  May be secondary to the above -Continue to monitor -Lactic  acid normal -No UA or CXR upon admission  Atrial Fibrillation -Currently not on anticoagulation -Not rate controlled, possible due to the above -Uses metoprolol at home -Will restart home dose of metoprolol -CHADSvasc at least 4 (given age, h/o CHF, HTN)  CHF -Unknown if systolic or diastolic, no echo in Epic -Monitor daily weights, intake/output -Lasix currently held  Hypothyroidism -Continue synthroid, will place on PO form today  Essential hypertension -Will restart oral metoprolol. Continue IV hydralazine PRN  Pain -likely secondary to the above -continue pain control as needed  Chronic normocytic Anemia/Gross hematuria -Secondary to the above -Hemoglobin baseline around 9, currently 8 -Will transfuse if below 7 -Continue to monitor CBC  DVT Prophylaxis  SCDs  Code Status: DNR  Family Communication: Son at bedside  Disposition Plan: Admitted, continue to monitor in stepdown  Consultants Urology  Procedures  cystoscopy with retrograde pyelogram, stent placement (L)  Antibiotics   Anti-infectives    Start     Dose/Rate Route Frequency Ordered Stop   07/20/16 0800  cefTRIAXone (ROCEPHIN) 2 g in dextrose 5 % 50 mL IVPB  Status:  Discontinued     2 g 100 mL/hr over 30 Minutes Intravenous On call 07/19/16 1350 07/19/16 2112   07/19/16 1200  piperacillin-tazobactam (ZOSYN) IVPB 2.25 g     2.25 g 100 mL/hr over 30 Minutes Intravenous Every 6 hours 07/19/16 0156     07/19/16 0200  piperacillin-tazobactam (ZOSYN) IVPB 3.375 g     3.375 g 100 mL/hr over 30 Minutes Intravenous  Once 07/19/16 0156 07/19/16 0315      Subjective:   Austin Jackson seen and examined today.  Feels pain has improved. Currently denies chest pain, shortness of  breath.  Denies any nausea or vomiting, diarrhea or constipation.    Objective:   Vitals:   07/20/16 0600 07/20/16 0800 07/20/16 0900 07/20/16 1000  BP: (!) 108/56 (!) 131/52  113/63  Pulse: 91 (!) 39 84 (!) 105  Resp: 17 20  (!) 22 20  Temp:  97.7 F (36.5 C)    TempSrc:  Oral    SpO2: 99% 97% 95% 97%  Weight:      Height:        Intake/Output Summary (Last 24 hours) at 07/20/16 1106 Last data filed at 07/20/16 0600  Gross per 24 hour  Intake          2866.67 ml  Output             2276 ml  Net           590.67 ml   Filed Weights   07/18/16 2303 07/19/16 0658  Weight: 70.3 kg (155 lb) 78.8 kg (173 lb 11.6 oz)    Exam  General: Well developed, elderly man, NAD,appears stated age  69: NCAT, mucous membranes moist.   Cardiovascular: S1 S2 auscultated, tachycardic, irregular, 2/6 SEM  Respiratory: Clear to auscultation bilaterally with equal chest rise  Abdomen: Soft, TTP LLQ, nondistended, + bowel sounds  Extremities: warm dry without cyanosis clubbing or edema  Neuro: AAOx3, nonfocal  Psych: Normal affect and demeanor with intact judgement and insight, pleasant   Data Reviewed: I have personally reviewed following labs and imaging studies  CBC:  Recent Labs Lab 07/18/16 1524 07/18/16 2212 07/18/16 2252 07/19/16 0551 07/19/16 1732 07/20/16 0320  WBC 12.8* 11.3*  --  12.5*  --  9.6  NEUTROABS  --  9.2*  --   --   --   --   HGB 9.7* 9.0* 9.2* 9.1* 9.5* 8.0*  HCT 29.4* 26.8* 27.0* 26.5* 28.0* 23.7*  MCV 92.2 92.7  --  92.0  --  92.2  PLT 294 275  --  294  --  [id number]   Basic Metabolic Panel:  Recent Labs Lab 07/18/16 2212 07/18/16 2252 07/19/16 0551 07/19/16 1732 07/19/16 1809 07/19/16 2230 07/20/16 0320  NA 128* 131* 131* 135 133* 134* 133*  K 5.8* 5.8* 6.2* 7.1* 5.0 4.7 4.3  CL 100* 101 104  --  104 107 106  CO2 22  --  19*  --  20* 21* 21*  GLUCOSE 153* 148* 136* 156* 242* 140* 110*  BUN 78* 73* 76*  --  78* 69* 64*  CREATININE 3.63* 3.90* 3.92*  --  3.90* 3.41* 3.04*  CALCIUM 8.1*  --  8.1*  --  8.0* 8.2* 7.9*   GFR: Estimated Creatinine Clearance: 15.3 mL/min (by C-G formula based on SCr of 3.04 mg/dL (H)). Liver Function Tests:  Recent Labs Lab  07/18/16 2212  AST 18  ALT 10*  ALKPHOS 61  BILITOT 0.7  PROT 6.8  ALBUMIN 2.5*   No results for input(s): LIPASE, AMYLASE in the last 168 hours. No results for input(s): AMMONIA in the last 168 hours. Coagulation Profile: No results for input(s): INR, PROTIME in the last 168 hours. Cardiac Enzymes: No results for input(s): CKTOTAL, CKMB, CKMBINDEX, TROPONINI in the last 168 hours. BNP (last 3 results) No results for input(s): PROBNP in the last 8760 hours. HbA1C: No results for input(s): HGBA1C in the last 72 hours. CBG:  Recent Labs Lab 07/19/16 2200 07/19/16 2312 07/20/16 0029 07/20/16 0145 07/20/16 0817  GLUCAP 131* 128* 109* 115* 85  Lipid Profile: No results for input(s): CHOL, HDL, LDLCALC, TRIG, CHOLHDL, LDLDIRECT in the last 72 hours. Thyroid Function Tests:  Recent Labs  07/19/16 0551  TSH 1.520   Anemia Panel: No results for input(s): VITAMINB12, FOLATE, FERRITIN, TIBC, IRON, RETICCTPCT in the last 72 hours. Urine analysis: No results found for: COLORURINE, APPEARANCEUR, LABSPEC, PHURINE, GLUCOSEU, HGBUR, BILIRUBINUR, KETONESUR, PROTEINUR, UROBILINOGEN, NITRITE, LEUKOCYTESUR Sepsis Labs: @LABRCNTIP (procalcitonin:4,lacticidven:4)  ) Recent Results (from the past 240 hour(s))  Culture, blood (Routine X 2) w Reflex to ID Panel     Status: None (Preliminary result)   Collection Time: 07/19/16  2:03 AM  Result Value Ref Range Status   Specimen Description BLOOD LEFT ANTECUBITAL  Final   Special Requests BOTTLES DRAWN AEROBIC ONLY 3CC  Final   Culture   Final    NO GROWTH < 12 HOURS Performed at [facility]    Report Status PENDING  Incomplete  Culture, blood (Routine X 2) w Reflex to ID Panel     Status: None (Preliminary result)   Collection Time: 07/19/16  2:03 AM  Result Value Ref Range Status   Specimen Description BLOOD BLOOD RIGHT FOREARM  Final   Special Requests BOTTLES DRAWN AEROBIC AND ANAEROBIC 10CC  Final   Culture   Final     NO GROWTH < 12 HOURS Performed at [facility]    Report Status PENDING  Incomplete  MRSA PCR Screening     Status: None   Collection Time: 07/19/16  7:00 AM  Result Value Ref Range Status   MRSA by PCR NEGATIVE NEGATIVE Final    Comment:        The GeneXpert MRSA Assay (FDA approved for NASAL specimens only), is one component of a comprehensive MRSA colonization surveillance program. It is not intended to diagnose MRSA infection nor to guide or monitor treatment for MRSA infections.       Radiology Studies: Ct Abdomen Pelvis Wo Contrast  Result Date: 07/18/2016 CLINICAL DATA:  Elevated creatinine over the last 6 days. Elevated potassium. Scheduled for nephrectomy in 2 days. Lower abdominal pain on the left. Hematuria. Right-sided stent. Good urine output. History of bladder cancer. EXAM: CT ABDOMEN AND PELVIS WITHOUT CONTRAST TECHNIQUE: Multidetector CT imaging of the abdomen and pelvis was performed following the standard protocol without IV contrast. COMPARISON:  None. FINDINGS: Lower chest: Areas of atelectasis and scarring in the lung bases. Cardiac enlargement. Calcification in the aortic and mitral valves is well is coronary arteries. Hepatobiliary: No focal liver abnormality is seen. No gallstones, gallbladder wall thickening, or biliary dilatation. Pancreas: Pancreas is atrophic. No inflammatory change or fluid collection. Spleen: Normal in size without focal abnormality. Adrenals/Urinary Tract: No adrenal gland nodules. Right kidney demonstrates parenchymal atrophy. Prominence of the extrarenal pelvis with a ureteral stent in place. Proximal pigtail is within the renal pelvis. Distal pigtail is in the distal ureter. Calcification along the right renal pelvic wall. Subcentimeter exophytic lesions likely representing hemorrhagic cysts. Solid lesions not entirely excluded. Left kidney demonstrates diffuse parenchymal atrophy. There is hydronephrosis and hydroureter down to the  level of the bladder. No obstructing stones are demonstrated. Within the dilated renal pelvis, there is a poorly defined hyperdense structure which could represent blood clot or a polypoid lesion. There is stranding around the left kidney which could be due to obstruction or infection. Subcentimeter exophytic lesion probably representing hemorrhagic cyst. Bladder is decompressed with a Foley catheter and cannot be well evaluated. Stomach/Bowel: Stomach is within normal limits. Appendix is  not identified. No evidence of bowel wall thickening, distention, or inflammatory changes. Vascular/Lymphatic: Extensive calcification throughout the abdominal aorta and branch vessels. No aneurysm. Inferior vena cava is unremarkable. No significant lymphadenopathy. Reproductive: The Foley catheter balloon appears to be at least partially within the prostate gland. Prostate enlargement, measuring 5.2 cm diameter. Other: Supraumbilical midline abdominal wall hernia containing fat. Atrophy of the abdominal wall musculature. No free air or free fluid. Musculoskeletal: Thoracolumbar scoliosis convex towards the right. Extensive degenerative changes throughout the spine. Compression of T11 vertebra. IMPRESSION: 1. Atrophy of the right kidney. Dilated extrarenal pelvis with ureteral stent in place. Distal pigtail of the stent is in the distal right ureter. Subcentimeter exophytic lesions probably representing hemorrhagic cysts. 2. Atrophy of the left kidney. Left renal hydronephrosis and hydroureter down to the level of the bladder. No obstructing stones demonstrated. Hyperdense mass or blood clot demonstrated in the left renal pelvis. Stranding around the left kidney may indicate obstruction or infection. Probable hemorrhagic cyst. 3. Bladder is decompressed with a Foley catheter and cannot be evaluated. At least part of the Foley catheter balloon appears to be within the prostate gland. Prostate enlargement. 4. Midline supraumbilical  abdominal wall hernia containing fat. Electronically Signed   By: Lucienne Capers M.D.   On: 2020-03-1717 23:21     Scheduled Meds: . latanoprost  1 drop Both Eyes QHS  . levothyroxine  25 mcg Intravenous Daily  . metoprolol  5 mg Intravenous Q6H  . piperacillin-tazobactam (ZOSYN)  IV  2.25 g Intravenous Q6H   Continuous Infusions:    LOS: 2 days   Time Spent in minutes   30 minutes  Zahirah Cheslock D.O. on 07/20/2016 at 11:06 AM  Between 7am to 7pm - Pager - [phone]  After 7pm go to [email or web address] - password [email or web address]  And look for the night coverage person covering for me after hours  [facility] Office  [phone]

## 2016-07-20 NOTE — Op Note (Signed)
Austin Jackson, Austin Jackson NO.:  [id number]  MEDICAL RECORD NO.:  [id number]  LOCATION:  [facility]                         FACILITY:  [facility]  PHYSICIAN:  [name], MD     DATE OF BIRTH:  Aug 14, 1919  DATE OF PROCEDURE: 07/19/2016                                OPERATIVE REPORT   DIAGNOSES:  Left hydronephrosis, acute renal failure with hyperkalemia, history of bladder cancer, and right ureteral cancer.  POSTOPERATIVE DIAGNOSES:  Left hydronephrosis, acute renal failure with hyperkalemia, history of bladder cancer, and right ureteral cancer.  PROCEDURE: 1. Cystoscopy with left retrograde pyelogram interpretation. 2. Insertion of left ureteral stent, 6 x 26 Contour, no tether.  ESTIMATED BLOOD LOSS:  Nil.  COMPLICATION:  None.  SPECIMEN:  None.  FINDINGS: 1. Likely left UVJ obstruction due to edema from recent bladder tumor     resection.  No obvious overt tumor within the bladder lumen in this     area. 2. Tortuous left ureter with large hydronephrosis, retro-pyelogram. 3. Successful placement of left ureteral stent, proximal in renal     pelvis and distal in urinary bladder. 4. Efflux of copious concentrated-appearing urine around the distal     end of the stent.  INDICATION:  Austin Jackson is a very pleasant 83 year old gentleman with recent history of a large volume bladder cancer as well as right renal pelvis, ureteral cancer, status post transurethral resection and right stenting recently.  His imaging beforehand was unremarkable on the left side.  He was tentatively scheduled to undergo right nephroureterectomy tomorrow with therapeutic and palliative intent.  However, preoperative labs demonstrated a significant hyperkalemia this preop visit. Therefore, he was urgently counseled to come to the hospital.  ER evaluation corroborated hyperkalemia.  He was admitted to the Medical Service for maneuvers to help manage his hyperkalemia as a bridge to renal  decompression, and he now presents for attempt left renal decompression with stenting under monitored anesthesia care.  Informed consent was obtained and placed in the medical record.  Notably, the patient's potassium has gone up and down throughout the day and through the very diligent efforts of our Anesthesia team, it has now decreased to 5 immediately preoperatively.  PROCEDURE IN DETAIL:  The patient being Austin Jackson was verified. Procedure being cysto, left retrograde, left stent in place was confirmed.  Procedure was carried out.  Time-out was performed. Intravenous antibiotics were administered.  Monitored anesthesia care was performed.  Sterile field was created by first removing the patient's in situ Foley catheter then placing him into a low lithotomy position.  Prepping his penis, perineum, proximal thighs, iodine, a 10 mL of Uro-Jet viscous lidocaine were administered per penis, and cystourethroscopy was performed using a rigid cystoscope with offset lens.  Inspection of the urinary bladder revealed no diverticula, calcifications, or papillary lesions.  There were multifocal recent tumor resection sites without visible papillary tumor.  Distal end of right ureteral stent was seen in situ.  The area of the left ureteral orifice was quite edematous and near area of recent tumor resection however, there was no obvious tumor over the lumen.  It was cannulated with 6-French end-hole catheter and left retrograde pyelogram was  obtained.  Left retrograde pyelogram demonstrated a single left ureter with single- system left kidney.  There was a new significant tortuosity of the entire length of the ureter as well as a very large hydronephrosis with likely blood products filling defect within the left renal pelvis.  A 0.038 Sensor wire was advanced to the upper pole which did allow the ureter to straighten significantly, over which, a new 6 x 26 Contour- type stent was placed  using cystoscopic and fluoroscopic guidance. There was immediate efflux of copious, very concentrated-appearing urine as well as some old-appearing blood around the distal end of the stent. The cystoscope was exchanged for a 20-French Foley catheter that was placed per urethra to straight drain 10 mL of sterile water in the balloon.  Procedure was terminated.  The patient tolerated the procedure well.  There were no immediate periprocedural complications.  The patient was taken to the Postanesthesia Care Unit in stable condition with plan for continued step-down admission for management of his acute renal failure and hyperkalemia.          ______________________________ [name], MD     TM/MEDQ  D:  07/19/2016  T:  07/20/2016  Job:  [id number]

## 2016-07-20 NOTE — Progress Notes (Signed)
1 Day Post-Op   Subjective:  1 - Acute on Chronic Renal Insufficiency / New Left Hydronephrosis - Baseline Cr 1.3's with acute rise to 3.9 with hyperkalemia to 6 on pre-op and ER labs 07/18/2016. ER CT with new left hydronephrosis to level of distal ureter.   Operative cysto left stent 1/10 where it appears obstruction from distal edema (close to prior area of tumor resection). UOP, Cr, K all trending better after left decompression.   2 -  Right Ureteral / Renal Pelvis / Bladder Cancer - BX-proven high grade superficial bladder cancer and large volume multifocal Rt ureteral cancer 07/2016 on eval recurrent gross hematuria. Was tentatively planning on right nephroureterectomy later this month.    Today "Austin Jackson" is  Improving. K <4.5, UOP excellent. Mental status back to baseline, Cr trending down.  Objective: Vital signs in last 24 hours: Temp:  [96.6 F (35.9 C)-98.4 F (36.9 C)] 97.7 F (36.5 C) (01/11 0443) Pulse Rate:  [40-162] 91 (01/11 0600) Resp:  [12-26] 17 (01/11 0600) BP: (93-180)/(43-109) 108/56 (01/11 0600) SpO2:  [96 %-100 %] 99 % (01/11 0600) Last BM Date: 07/17/16  Intake/Output from previous day: 01/10 0701 - 01/11 0700 In: 2866.7 [I.V.:2716.7; IV Piggyback:150] Out: 2276 [Urine:2275; Stool:1] Intake/Output this shift: No intake/output data recorded.  General appearance: alert, cooperative and appears stated age Eyes: negative Nose: Nares normal. Septum midline. Mucosa normal. No drainage or sinus tenderness. Throat: lips, mucosa, and tongue normal; teeth and gums normal Neck: supple, symmetrical, trachea midline Back: symmetric, no curvature. ROM normal. No CVA tenderness. Resp: non-labored on [place] O2.  Cardio: Nl rate wtih irregular rythem by bedside monitor.  GI: soft, non-tender; bowel sounds normal; no masses,  no organomegaly Male genitalia: foley in palce with copious tea colored urine, no clots. No left CVAT today.  Extremities: extremities normal,  atraumatic, no cyanosis or edema Pulses: 2+ and symmetric Lymph nodes: Cervical, supraclavicular, and axillary nodes normal. Neurologic: Grossly normal Son with him at bedside in stepdown.   Lab Results:   Recent Labs  07/19/16 0551 07/20/16 0320  WBC 12.5* 9.6  HGB 9.1* 8.0*  HCT 26.5* 23.7*  PLT 294 242   BMET  Recent Labs  07/19/16 2230 07/20/16 0320  NA 134* 133*  K 4.7 4.3  CL 107 106  CO2 21* 21*  GLUCOSE 140* 110*  BUN 69* 64*  CREATININE 3.41* 3.04*  CALCIUM 8.2* 7.9*   PT/INR No results for input(s): LABPROT, INR in the last 72 hours. ABG No results for input(s): PHART, HCO3 in the last 72 hours.  Invalid input(s): PCO2, PO2  Studies/Results: Ct Abdomen Pelvis Wo Contrast  Result Date: 07/18/2016 CLINICAL DATA:  Elevated creatinine over the last 6 days. Elevated potassium. Scheduled for nephrectomy in 2 days. Lower abdominal pain on the left. Hematuria. Right-sided stent. Good urine output. History of bladder cancer. EXAM: CT ABDOMEN AND PELVIS WITHOUT CONTRAST TECHNIQUE: Multidetector CT imaging of the abdomen and pelvis was performed following the standard protocol without IV contrast. COMPARISON:  None. FINDINGS: Lower chest: Areas of atelectasis and scarring in the lung bases. Cardiac enlargement. Calcification in the aortic and mitral valves is well is coronary arteries. Hepatobiliary: No focal liver abnormality is seen. No gallstones, gallbladder wall thickening, or biliary dilatation. Pancreas: Pancreas is atrophic. No inflammatory change or fluid collection. Spleen: Normal in size without focal abnormality. Adrenals/Urinary Tract: No adrenal gland nodules. Right kidney demonstrates parenchymal atrophy. Prominence of the extrarenal pelvis with a ureteral stent in place. Proximal pigtail  is within the renal pelvis. Distal pigtail is in the distal ureter. Calcification along the right renal pelvic wall. Subcentimeter exophytic lesions likely representing  hemorrhagic cysts. Solid lesions not entirely excluded. Left kidney demonstrates diffuse parenchymal atrophy. There is hydronephrosis and hydroureter down to the level of the bladder. No obstructing stones are demonstrated. Within the dilated renal pelvis, there is a poorly defined hyperdense structure which could represent blood clot or a polypoid lesion. There is stranding around the left kidney which could be due to obstruction or infection. Subcentimeter exophytic lesion probably representing hemorrhagic cyst. Bladder is decompressed with a Foley catheter and cannot be well evaluated. Stomach/Bowel: Stomach is within normal limits. Appendix is not identified. No evidence of bowel wall thickening, distention, or inflammatory changes. Vascular/Lymphatic: Extensive calcification throughout the abdominal aorta and branch vessels. No aneurysm. Inferior vena cava is unremarkable. No significant lymphadenopathy. Reproductive: The Foley catheter balloon appears to be at least partially within the prostate gland. Prostate enlargement, measuring 5.2 cm diameter. Other: Supraumbilical midline abdominal wall hernia containing fat. Atrophy of the abdominal wall musculature. No free air or free fluid. Musculoskeletal: Thoracolumbar scoliosis convex towards the right. Extensive degenerative changes throughout the spine. Compression of T11 vertebra. IMPRESSION: 1. Atrophy of the right kidney. Dilated extrarenal pelvis with ureteral stent in place. Distal pigtail of the stent is in the distal right ureter. Subcentimeter exophytic lesions probably representing hemorrhagic cysts. 2. Atrophy of the left kidney. Left renal hydronephrosis and hydroureter down to the level of the bladder. No obstructing stones demonstrated. Hyperdense mass or blood clot demonstrated in the left renal pelvis. Stranding around the left kidney may indicate obstruction or infection. Probable hemorrhagic cyst. 3. Bladder is decompressed with a Foley  catheter and cannot be evaluated. At least part of the Foley catheter balloon appears to be within the prostate gland. Prostate enlargement. 4. Midline supraumbilical abdominal wall hernia containing fat. Electronically Signed   By: Lucienne Capers M.D.   On: 10-22-2016 23:21    Anti-infectives: Anti-infectives    Start     Dose/Rate Route Frequency Ordered Stop   07/20/16 0800  cefTRIAXone (ROCEPHIN) 2 g in dextrose 5 % 50 mL IVPB  Status:  Discontinued     2 g 100 mL/hr over 30 Minutes Intravenous On call 07/19/16 1350 07/19/16 2112   07/19/16 1200  piperacillin-tazobactam (ZOSYN) IVPB 2.25 g     2.25 g 100 mL/hr over 30 Minutes Intravenous Every 6 hours 07/19/16 0156     07/19/16 0200  piperacillin-tazobactam (ZOSYN) IVPB 3.375 g     3.375 g 100 mL/hr over 30 Minutes Intravenous  Once 07/19/16 0156 07/19/16 0315      Assessment/Plan:  1 - Acute on Chronic Renal Insufficiency / New Left Hydronephrosis - improving wtih renal decompression. K normalizing. This is fortunate.   Greatly appreciate medial team comanagemnt and anesthesia help in peri-op optimization for his procedure yesterday.   Keep foley for now UOP monitoring critical. No further procedural intervention this hospitalization.   2 -  Right Ureteral / Renal Pelvis / Bladder Cancer - will reassesses role of further extirpative surgery at elective follow up pendign his functional status.   He has outpatient GU follow up arranged and this was put on his DC info.   Please call me directly with questions.   [facility], [place] 07/20/2016

## 2016-07-21 ENCOUNTER — Inpatient Hospital Stay (HOSPITAL_COMMUNITY): Payer: Medicare Other

## 2016-07-21 LAB — CBC
HEMATOCRIT: 25.7 % — AB (ref 39.0–52.0)
Hemoglobin: 8.7 g/dL — ABNORMAL LOW (ref 13.0–17.0)
MCH: 30.9 pg (ref 26.0–34.0)
MCHC: 33.9 g/dL (ref 30.0–36.0)
MCV: 91.1 fL (ref 78.0–100.0)
PLATELETS: 253 10*3/uL (ref 150–400)
RBC: 2.82 MIL/uL — AB (ref 4.22–5.81)
RDW: 14.6 % (ref 11.5–15.5)
WBC: 7.3 10*3/uL (ref 4.0–10.5)

## 2016-07-21 LAB — BASIC METABOLIC PANEL
ANION GAP: 6 (ref 5–15)
BUN: 54 mg/dL — ABNORMAL HIGH (ref 6–20)
CALCIUM: 7.6 mg/dL — AB (ref 8.9–10.3)
CO2: 21 mmol/L — AB (ref 22–32)
Chloride: 109 mmol/L (ref 101–111)
Creatinine, Ser: 2 mg/dL — ABNORMAL HIGH (ref 0.61–1.24)
GFR, EST AFRICAN AMERICAN: 31 mL/min — AB (ref >60)
GFR, EST NON AFRICAN AMERICAN: 27 mL/min — AB (ref >60)
GLUCOSE: 140 mg/dL — AB (ref 65–99)
POTASSIUM: 3.8 mmol/L (ref 3.5–5.1)
Sodium: 136 mmol/L (ref 135–145)

## 2016-07-21 LAB — GLUCOSE, CAPILLARY
GLUCOSE-CAPILLARY: 134 mg/dL — AB (ref 65–99)
GLUCOSE-CAPILLARY: 152 mg/dL — AB (ref 65–99)
Glucose-Capillary: 131 mg/dL — ABNORMAL HIGH (ref 65–99)
Glucose-Capillary: 137 mg/dL — ABNORMAL HIGH (ref 65–99)

## 2016-07-21 LAB — URINE CULTURE: CULTURE: NO GROWTH

## 2016-07-21 LAB — PROCALCITONIN: PROCALCITONIN: 0.15 ng/mL

## 2016-07-21 MED ORDER — PIPERACILLIN-TAZOBACTAM 3.375 G IVPB
3.3750 g | Freq: Three times a day (TID) | INTRAVENOUS | Status: DC
  Administered 2016-07-21 – 2016-07-23 (×6): 3.375 g via INTRAVENOUS
  Filled 2016-07-21 (×6): qty 50

## 2016-07-21 MED ORDER — MAGNESIUM CITRATE PO SOLN
1.0000 | Freq: Once | ORAL | Status: AC
  Administered 2016-07-21: 1 via ORAL
  Filled 2016-07-21: qty 296

## 2016-07-21 NOTE — Progress Notes (Signed)
2 Days Post-Op  Subjective:  1 - Acute on Chronic Renal Insufficiency / New Left Hydronephrosis - Baseline Cr 1.3's with acute rise to 3.9 with hyperkalemia to 6 on pre-op and ER labs 07/18/2016. ER CT with new left hydronephrosis to level of distal ureter.   Operative cysto left stent 1/10 where it appears obstruction from distal edema (close to prior area of tumor resection). UOP, Cr, K all trending better after left decompression.   2 -  Right Ureteral / Renal Pelvis / Bladder Cancer - BX-proven high grade superficial bladder cancer and large volume multifocal Rt ureteral cancer 07/2016 on eval recurrent gross hematuria. Was tentatively planning on right nephroureterectomy later this month.    Today "Austin Jackson" continues to improve. Cr 2 and K<4. Excellent UOP.   Objective: Vital signs in last 24 hours: Temp:  [97.7 F (36.5 C)-98.1 F (36.7 C)] 98.1 F (36.7 C) (01/12 1200) Pulse Rate:  [61-159] 98 (01/12 1100) Resp:  [16-28] 18 (01/12 1100) BP: (100-151)/(51-72) 105/72 (01/12 1200) SpO2:  [97 %-100 %] 97 % (01/12 1100) Last BM Date: 07/17/16  Intake/Output from previous day: 01/11 0701 - 01/12 0700 In: 200 [IV Piggyback:200] Out: 1926 [Urine:1926] Intake/Output this shift: Total I/O In: -  Out: 2 [Stool:2]  General appearance: alert, cooperative, appears stated age and son "Austin Jackson" at bedside.  Eyes: negative Nose: Nares normal. Septum midline. Mucosa normal. No drainage or sinus tenderness. Throat: lips, mucosa, and tongue normal; teeth and gums normal Neck: supple, symmetrical, trachea midline Back: symmetric, no curvature. ROM normal. No CVA tenderness. Resp: non-labored, non-coarse on [place] O2.  Cardio: Nl rate by bedside monitor.  GI: soft, non-tender; bowel sounds normal; no masses,  no organomegaly Male genitalia: normal, foley c/d/i with tea colored urine that is thin and w/o clots.  Extremities: extremities normal, atraumatic, no cyanosis or edema Pulses: 2+ and  symmetric Neurologic: Grossly normal Incision/Wound: none  Lab Results:   Recent Labs  07/20/16 0320 07/21/16 0408  WBC 9.6 7.3  HGB 8.0* 8.7*  HCT 23.7* 25.7*  PLT 242 253   BMET  Recent Labs  07/20/16 0320 07/21/16 0408  NA 133* 136  K 4.3 3.8  CL 106 109  CO2 21* 21*  GLUCOSE 110* 140*  BUN 64* 54*  CREATININE 3.04* 2.00*  CALCIUM 7.9* 7.6*   PT/INR No results for input(s): LABPROT, INR in the last 72 hours. ABG No results for input(s): PHART, HCO3 in the last 72 hours.  Invalid input(s): PCO2, PO2  Studies/Results: Dg Chest 2 View  Result Date: 07/21/2016 CLINICAL DATA:  Cough.  History of bladder carcinoma EXAM: CHEST  2 VIEW COMPARISON:  None. FINDINGS: There is a fairly small but present left pleural effusion. There is slight bibasilar atelectasis. There is no edema or consolidation. Heart is slightly enlarged with pulmonary vascularity within normal limits. No adenopathy. There is atherosclerotic calcification in the aorta. There is marked collapse of the T11 vertebral body, age uncertain. IMPRESSION: Small left pleural effusion. Mild bibasilar atelectasis. No edema or consolidation. Mild cardiomegaly with aortic atherosclerosis. Collapse of the T11 vertebral body. Electronically Signed   By: Lowella Grip III M.D.   On: 07/21/2016 11:04    Anti-infectives: Anti-infectives    Start     Dose/Rate Route Frequency Ordered Stop   07/21/16 1400  piperacillin-tazobactam (ZOSYN) IVPB 3.375 g     3.375 g 12.5 mL/hr over 240 Minutes Intravenous Every 8 hours 07/21/16 1021     07/20/16 0800  cefTRIAXone (ROCEPHIN)  2 g in dextrose 5 % 50 mL IVPB  Status:  Discontinued     2 g 100 mL/hr over 30 Minutes Intravenous On call 07/19/16 1350 07/19/16 2112   07/19/16 1200  piperacillin-tazobactam (ZOSYN) IVPB 2.25 g  Status:  Discontinued     2.25 g 100 mL/hr over 30 Minutes Intravenous Every 6 hours 07/19/16 0156 07/21/16 1021   07/19/16 0200  piperacillin-tazobactam  (ZOSYN) IVPB 3.375 g     3.375 g 100 mL/hr over 30 Minutes Intravenous  Once 07/19/16 0156 07/19/16 0315      Assessment/Plan:   1 - Acute on Chronic Renal Insufficiency / New Left Hydronephrosis - improving quickly wtih renal decompression.    2 -  Right Ureteral / Renal Pelvis / Bladder Cancer - will reassesses role of further extirpative surgery at elective follow up pending his functional status.   He has outpatient GU follow up arranged and his family is aware of date/time.   I feel pt OK for DC at anytime from GU perspective.  We will follow pt PRN over the weekend, please call on-call Urology with any questions.    [facility], Austin Jackson 07/21/2016

## 2016-07-21 NOTE — Progress Notes (Signed)
Pharmacy Antibiotic Note  Austin Jackson is a 83 y.o. male admitted on 07/18/2016 with Intra-abdominal infection.  Pharmacy has been consulted for zosyn dosing on 1/10  Plan: Due to improved SCr and CrCl now > 20 ml/min, will change Zosyn from 2.25g IV q6 to Zosyn 3.375g IV q8 (extended interval infusion)   Height: 5\' 10"  (177.8 cm) Weight: 173 lb 11.6 oz (78.8 kg) IBW/kg (Calculated) : 73  Temp (24hrs), Avg:98 F (36.7 C), Min:97.7 F (36.5 C), Max:98.1 F (36.7 C)   Recent Labs Lab 07/18/16 1524 07/18/16 2212  07/19/16 0205 07/19/16 0551 07/19/16 0553 07/19/16 1809 07/19/16 2230 07/20/16 0320 07/21/16 0408  WBC 12.8* 11.3*  --   --  12.5*  --   --   --  9.6 7.3  CREATININE 3.56* 3.63*  < >  --  3.92*  --  3.90* 3.41* 3.04* 2.00*  LATICACIDVEN  --   --   --  1.4  --  1.0  --   --   --   --   < > = values in this interval not displayed.  Estimated Creatinine Clearance: 23.3 mL/min (by C-G formula based on SCr of 2 mg/dL (H)).    Allergies  Allergen Reactions  . Codeine Nausea And Vomiting  . Ultram [Tramadol Hcl] Other (See Comments)    Confusion     Antimicrobials this admission: Zosyn 1/10 >>  Dose adjustments this admission: -  Microbiology results: 1/10 BCx: ngtd 1/10 MRSA PCR: neg 1/11 UCx: ngf   Thank you for allowing pharmacy to be a part of this patient's care.   Adrian Saran, PharmD, BCPS Pager [phone] 07/21/2016 10:20 AM

## 2016-07-21 NOTE — Progress Notes (Signed)
PROGRESS NOTE    Austin Jackson  [id number] DOB: 1919-07-17 DOA: 07/18/2016 PCP: Austin Mccreedy, MD   Chief Complaint  Patient presents with  . Abnormal Lab  . Cancer     Brief Narrative:  HPI on 07/19/2016 by Dr. Gean Birchwood Austin Jackson is a 83 y.o. male with history of atrial fibrillation not on anticoagulation, CHF EF not known, hypothyroidism who had undergone recently bladder and renal biopsy for mass which turned out to be high-grade urothelial carcinoma and at the same time had stent placed with the right kidney was referred to the ER after patient's preop workup showed acute renal failure with hyperkalemia. Patient was scheduled to have surgery tomorrow. As per patient's son who is also a physician, patient has been feeling increasingly weak over the last 1 week with poor appetite. Creatinine has worsened from 1.1-3.5 with hyperkalemia. Patient denies any chest pain shortness of breath abdominal pain and diarrhea. At the time of my exam patient is mildly hypothermic with temperature of 58F and tachycardic.   CT of the abdomen and pelvis done this night shows new left renal pelvic mass versus blood clot with hydronephrosis. There is also possibility of perinephric stranding with infection. Assessment & Plan   Acute renal failure with hyperkalemia superimposed on CKD, stage secondary to New left hydronephrolosis -Creatinine baseline 1.3, peaked creatinine at 3.92 -Hyperkalemia resolved, K 3.8 today -Creatinine today 2 -CT abdomen pelvis noted Left hydronephrosis -Will continue to monitor BMP -Urology consulted and appreciated, s/p cystoscopy with retrograde pyelogram, stent placement (L) -Currently on zosyn -Continue to monitor intake/output  SIRS -Presented with tachycardia, leukocytosis -Leukocytosis improved -Blood cultures show to growth to date -Placed on zosyn -No clear source of infection.  May be secondary to the above -Continue to monitor -Lactic  acid normal -No UA or CXR upon admission  Atrial Fibrillation -Currently not on anticoagulation -Not rate controlled, possible due to the above -Continue metoprolol -CHADSvasc at least 4 (given age, h/o CHF, HTN)  CHF -Unknown if systolic or diastolic, no echo in Epic -Monitor daily weights, intake/output -Lasix currently held  Hypothyroidism -Continue synthroid, will place on PO form today  Essential hypertension -Continue metoprolol and IV hydralazine PRN  Pain -likely secondary to the above -continue pain control as needed  Chronic normocytic Anemia/Gross hematuria -Secondary to the above -Hemoglobin baseline around 9, currently 8.7 -Will transfuse if below 7 -Continue to monitor CBC  Mild congestion/cough -Will order CXR.  DVT Prophylaxis  SCDs  Code Status: DNR  Family Communication: None at bedside  Disposition Plan: Admitted, Will transfer to tele.  Suspect possible discharge within 24-48 hours. Will ask PT to see.   Consultants Urology  Procedures  cystoscopy with retrograde pyelogram, stent placement (L)  Antibiotics   Anti-infectives    Start     Dose/Rate Route Frequency Ordered Stop   07/21/16 1400  piperacillin-tazobactam (ZOSYN) IVPB 3.375 g     3.375 g 12.5 mL/hr over 240 Minutes Intravenous Every 8 hours 07/21/16 1021     07/20/16 0800  cefTRIAXone (ROCEPHIN) 2 g in dextrose 5 % 50 mL IVPB  Status:  Discontinued     2 g 100 mL/hr over 30 Minutes Intravenous On call 07/19/16 1350 07/19/16 2112   07/19/16 1200  piperacillin-tazobactam (ZOSYN) IVPB 2.25 g  Status:  Discontinued     2.25 g 100 mL/hr over 30 Minutes Intravenous Every 6 hours 07/19/16 0156 07/21/16 1021   07/19/16 0200  piperacillin-tazobactam (ZOSYN) IVPB 3.375 g  3.375 g 100 mL/hr over 30 Minutes Intravenous  Once 07/19/16 0156 07/19/16 0315      Subjective:   Austin Jackson seen and examined today.  Feels pain has improved.  States he is drinking something for a  procedure, and is choking on the drink.  (spoke to RN, magnesium citrate). Currently denies chest pain, shortness of breath.  Denies any nausea or vomiting, diarrhea or constipation.  Complains of cough.   Objective:   Vitals:   07/21/16 0400 07/21/16 0441 07/21/16 0500 07/21/16 0800  BP: 101/61   (!) 122/53  Pulse: (!) 121  69 75  Resp: (!) 21  20 (!) 28  Temp:  98.1 F (36.7 C)  97.8 F (36.6 C)  TempSrc:  Oral  Oral  SpO2: 97%  98% 97%  Weight:      Height:        Intake/Output Summary (Last 24 hours) at 07/21/16 1036 Last data filed at 07/21/16 0600  Gross per 24 hour  Intake              200 ml  Output             1126 ml  Net             -926 ml   Filed Weights   07/18/16 2303 07/19/16 0658  Weight: 70.3 kg (155 lb) 78.8 kg (173 lb 11.6 oz)    Exam  General: Well developed, elderly man, NAD,appears stated age  67: NCAT, mucous membranes moist.   Cardiovascular: S1 S2 auscultated, tachycardic, irregular, 2/6 SEM  Respiratory: Clear to auscultation bilaterally with equal chest rise  Abdomen: Soft, nontender, nondistended, + bowel sounds  Extremities: warm dry without cyanosis clubbing or edema  Neuro: AAOx3, nonfocal  Psych: Normal affect and demeanor, pleasant   Data Reviewed: I have personally reviewed following labs and imaging studies  CBC:  Recent Labs Lab 07/18/16 1524 07/18/16 2212 07/18/16 2252 07/19/16 0551 07/19/16 1732 07/20/16 0320 07/21/16 0408  WBC 12.8* 11.3*  --  12.5*  --  9.6 7.3  NEUTROABS  --  9.2*  --   --   --   --   --   HGB 9.7* 9.0* 9.2* 9.1* 9.5* 8.0* 8.7*  HCT 29.4* 26.8* 27.0* 26.5* 28.0* 23.7* 25.7*  MCV 92.2 92.7  --  92.0  --  92.2 91.1  PLT 294 275  --  294  --  242 [id number]   Basic Metabolic Panel:  Recent Labs Lab 07/19/16 0551 07/19/16 1732 07/19/16 1809 07/19/16 2230 07/20/16 0320 07/21/16 0408  NA 131* 135 133* 134* 133* 136  K 6.2* 7.1* 5.0 4.7 4.3 3.8  CL 104  --  104 107 106 109  CO2 19*  --   20* 21* 21* 21*  GLUCOSE 136* 156* 242* 140* 110* 140*  BUN 76*  --  78* 69* 64* 54*  CREATININE 3.92*  --  3.90* 3.41* 3.04* 2.00*  CALCIUM 8.1*  --  8.0* 8.2* 7.9* 7.6*   GFR: Estimated Creatinine Clearance: 23.3 mL/min (by C-G formula based on SCr of 2 mg/dL (H)). Liver Function Tests:  Recent Labs Lab 07/18/16 2212  AST 18  ALT 10*  ALKPHOS 61  BILITOT 0.7  PROT 6.8  ALBUMIN 2.5*   No results for input(s): LIPASE, AMYLASE in the last 168 hours. No results for input(s): AMMONIA in the last 168 hours. Coagulation Profile: No results for input(s): INR, PROTIME in the last 168 hours. Cardiac Enzymes: No  results for input(s): CKTOTAL, CKMB, CKMBINDEX, TROPONINI in the last 168 hours. BNP (last 3 results) No results for input(s): PROBNP in the last 8760 hours. HbA1C: No results for input(s): HGBA1C in the last 72 hours. CBG:  Recent Labs Lab 07/20/16 1639 07/20/16 2019 07/21/16 0029 07/21/16 0439 07/21/16 0803  GLUCAP 170* 266* 131* 134* 137*   Lipid Profile: No results for input(s): CHOL, HDL, LDLCALC, TRIG, CHOLHDL, LDLDIRECT in the last 72 hours. Thyroid Function Tests:  Recent Labs  07/19/16 0551  TSH 1.520   Anemia Panel: No results for input(s): VITAMINB12, FOLATE, FERRITIN, TIBC, IRON, RETICCTPCT in the last 72 hours. Urine analysis: No results found for: COLORURINE, APPEARANCEUR, LABSPEC, PHURINE, GLUCOSEU, HGBUR, BILIRUBINUR, KETONESUR, PROTEINUR, UROBILINOGEN, NITRITE, LEUKOCYTESUR Sepsis Labs: @LABRCNTIP (procalcitonin:4,lacticidven:4)  ) Recent Results (from the past 240 hour(s))  Culture, blood (Routine X 2) w Reflex to ID Panel     Status: None (Preliminary result)   Collection Time: 07/19/16  2:03 AM  Result Value Ref Range Status   Specimen Description BLOOD LEFT ANTECUBITAL  Final   Special Requests BOTTLES DRAWN AEROBIC ONLY 3CC  Final   Culture   Final    NO GROWTH 1 DAY Performed at [facility]    Report Status PENDING   Incomplete  Culture, blood (Routine X 2) w Reflex to ID Panel     Status: None (Preliminary result)   Collection Time: 07/19/16  2:03 AM  Result Value Ref Range Status   Specimen Description BLOOD BLOOD RIGHT FOREARM  Final   Special Requests BOTTLES DRAWN AEROBIC AND ANAEROBIC 10CC  Final   Culture   Final    NO GROWTH 1 DAY Performed at [facility]    Report Status PENDING  Incomplete  MRSA PCR Screening     Status: None   Collection Time: 07/19/16  7:00 AM  Result Value Ref Range Status   MRSA by PCR NEGATIVE NEGATIVE Final    Comment:        The GeneXpert MRSA Assay (FDA approved for NASAL specimens only), is one component of a comprehensive MRSA colonization surveillance program. It is not intended to diagnose MRSA infection nor to guide or monitor treatment for MRSA infections.   Culture, Urine     Status: None   Collection Time: 07/20/16  1:17 AM  Result Value Ref Range Status   Specimen Description URINE, RANDOM  Final   Special Requests NONE  Final   Culture NO GROWTH Performed at [facility]   Final   Report Status 07/21/2016 FINAL  Final      Radiology Studies: No results found.   Scheduled Meds: . ferrous sulfate  325 mg Oral BID WC  . gabapentin  300 mg Oral QHS  . latanoprost  1 drop Both Eyes QHS  . levothyroxine  50 mcg Oral QHS  . metoprolol tartrate  12.5 mg Oral TID  . piperacillin-tazobactam (ZOSYN)  IV  3.375 g Intravenous Q8H  . tamsulosin  0.4 mg Oral QPC supper   Continuous Infusions:    LOS: 3 days   Time Spent in minutes   30 minutes  Carmela Piechowski D.O. on 07/21/2016 at 10:36 AM  Between 7am to 7pm - Pager - [phone]  After 7pm go to [email or web address] - password [email or web address]  And look for the night coverage person covering for me after hours  [facility] Office  [phone]

## 2016-07-22 LAB — GLUCOSE, CAPILLARY
GLUCOSE-CAPILLARY: 173 mg/dL — AB (ref 65–99)
GLUCOSE-CAPILLARY: 149 mg/dL — AB (ref 65–99)
GLUCOSE-CAPILLARY: 149 mg/dL — AB (ref 65–99)
GLUCOSE-CAPILLARY: 140 mg/dL — AB (ref 65–99)
GLUCOSE-CAPILLARY: 135 mg/dL — AB (ref 65–99)
Glucose-Capillary: 150 mg/dL — ABNORMAL HIGH (ref 65–99)
Glucose-Capillary: 189 mg/dL — ABNORMAL HIGH (ref 65–99)
Glucose-Capillary: 148 mg/dL — ABNORMAL HIGH (ref 65–99)

## 2016-07-22 LAB — CBC
HCT: 29.5 % — ABNORMAL LOW (ref 39.0–52.0)
Hemoglobin: 9.6 g/dL — ABNORMAL LOW (ref 13.0–17.0)
MCH: 29.5 pg (ref 26.0–34.0)
MCHC: 32.5 g/dL (ref 30.0–36.0)
MCV: 90.8 fL (ref 78.0–100.0)
PLATELETS: 317 10*3/uL (ref 150–400)
RBC: 3.25 MIL/uL — AB (ref 4.22–5.81)
RDW: 14.7 % (ref 11.5–15.5)
WBC: 9.7 10*3/uL (ref 4.0–10.5)

## 2016-07-22 LAB — BASIC METABOLIC PANEL
ANION GAP: 9 (ref 5–15)
BUN: 46 mg/dL — ABNORMAL HIGH (ref 6–20)
CO2: 21 mmol/L — ABNORMAL LOW (ref 22–32)
Calcium: 8.2 mg/dL — ABNORMAL LOW (ref 8.9–10.3)
Chloride: 104 mmol/L (ref 101–111)
Creatinine, Ser: 1.6 mg/dL — ABNORMAL HIGH (ref 0.61–1.24)
GFR, EST AFRICAN AMERICAN: 41 mL/min — AB (ref >60)
GFR, EST NON AFRICAN AMERICAN: 35 mL/min — AB (ref >60)
Glucose, Bld: 155 mg/dL — ABNORMAL HIGH (ref 65–99)
POTASSIUM: 4 mmol/L (ref 3.5–5.1)
SODIUM: 134 mmol/L — AB (ref 135–145)

## 2016-07-22 MED ORDER — FUROSEMIDE 20 MG PO TABS
20.0000 mg | ORAL_TABLET | Freq: Every day | ORAL | Status: DC
  Administered 2016-07-22 – 2016-07-23 (×2): 20 mg via ORAL
  Filled 2016-07-22 (×2): qty 1

## 2016-07-22 MED ORDER — BRIMONIDINE TARTRATE 0.15 % OP SOLN
1.0000 [drp] | Freq: Three times a day (TID) | OPHTHALMIC | Status: DC
  Administered 2016-07-22 – 2016-07-23 (×2): 1 [drp] via OPHTHALMIC
  Filled 2016-07-22: qty 5

## 2016-07-22 NOTE — Discharge Summary (Signed)
Physician Discharge Summary  Austin Jackson [id number] DOB: 19-Feb-1920 DOA: 07/18/2016  PCP: Austin Mccreedy, MD  Admit date: 07/18/2016 Discharge date: 07/23/2016  Time spent: 45 minutes  Recommendations for Outpatient Follow-up:  Patient will be discharged to home with home health.  Patient will need to follow up with primary care provider within one week of discharge, repeat CBC and BMP.  Follow up with Dr. Tresa Jackson on 08/03/2016 at 11:45am.  Patient should continue medications as prescribed.  Patient should follow a renal/heart healthy diet.  Discharge Diagnoses:  Acute renal failure with hyperkalemia superimposed on CKD, stage secondary to New left hydronephrolosis SIRS Atrial Fibrillation CHF Hypothyroidism Essential hypertension Pain Chronic normocytic Anemia/Gross hematuria Mild congestion/cough Acute delirium   Discharge Condition: Stable  Diet recommendation: Renal/heart healthy with fluid restriction  Filed Weights   07/18/16 2303 07/19/16 0658  Weight: 70.3 kg (155 lb) 78.8 kg (173 lb 11.6 oz)    History of present illness:  on 07/19/2016 by Dr. Glenda Chroman Jackson a 83 y.o.malewith history of atrial fibrillation not on anticoagulation, CHF EF not known, hypothyroidism who had undergone recently bladder and renal biopsy for mass which turned out to be high-grade urothelial carcinoma and at the same time had stent placed with the right kidney was referred to the ER after patient's preop workup showed acute renal failure with hyperkalemia. Patient was scheduled to have surgery tomorrow. As per patient's son who is also a physician, patient has been feeling increasingly weak over the last 1 week with poor appetite. Creatinine has worsened from 1.1-3.5 with hyperkalemia. Patient denies any chest pain shortness of breath abdominal pain and diarrhea. At the time of my exam patient is mildly hypothermic with temperature of 75F and tachycardic.   CT of the  abdomen and pelvis done this night shows new left renal pelvic mass versus blood clot with hydronephrosis. There is also possibility of perinephric stranding with infection.  Hospital Course:  Acute renal failure with hyperkalemia superimposed on CKD, stage secondary to New left hydronephrolosis -Creatinine baseline 1.3, peaked creatinine at 3.92 -Hyperkalemia resolved, K 4.6 today -Creatinine today 1.7 -CT abdomen pelvis noted Left hydronephrosis -Urology consulted and appreciated, s/p cystoscopy with retrograde pyelogram, stent placement (L) -was placed on zosyn  -Continue to monitor intake/output -Repeat BMP in one week -Follow up with Dr. Tresa Jackson, urology. -Will discharge patient with foley catheter  SIRS -Presented with tachycardia, leukocytosis -Leukocytosis improved -Blood cultures show to growth to date -urine culture shows no growth -Placed on zosyn during hospitalization  -No clear source of infection.  May be secondary to the above -Continue to monitor -Lactic acid normal -No UA or CXR upon admission  Atrial Fibrillation -Currently not on anticoagulation -Not rate controlled, possible due to the above -Continue metoprolol -CHADSvasc at least 4 (given age, h/o CHF, HTN)  CHF -Unknown if systolic or diastolic, no echo in Epic -Currently euvolemic  -Monitor daily weights, intake/output -Lasix  Initially held due to AKI.   Hypothyroidism -Continue synthroid  Essential hypertension -Continue metoprolol  -Lasix to be resumed at discharge.  Pain -likely secondary to the above -continue pain control as needed  Chronic normocytic Anemia/Gross hematuria -Secondary to the above -Hemoglobin baseline around 9, currently 9.9 -Repeat CBC in one week  Mild congestion/cough -CXR showed small left pleural effusion, no infection.  -Resume lasix at discharge, follow BMP closely. -Currently patient has no leukocytosis and afebrile.   Acute delirium -Unknown  cause. Intermittent. -Currently no source of infection.  Currently afebrile,  no leukocytosis.  -Blood and urine cultures show no growth -CXR shows no infection -Spoke with patient's son. Confusion may be from change in floor during hospitalization.  Patient has been in the hospital for 4 days, however, in ICU/stepdown for 3.  Recently moved last night.  Changing patient's surroundings can certainly cause him to have AMS. Feel that patient's mental status will improve when he is back in his familiar surroundings. Son agrees.  -Offered CT head, however, if patient truly had a stroke, it would not change management. Patient is not a candidate for anticoagulation. Son agrees and declined MRI  Deconditioning -PT consulted and recommended home health -Case management consulted  Consultants Urology  Procedures  cystoscopy with retrograde pyelogram, stent placement (L)  Discharge Exam: Vitals:   07/22/16 1959 07/23/16 0651  BP: 118/84 137/86  Pulse: 71 89  Resp: 18 19  Temp: 97.8 F (36.6 C) 98.5 F (36.9 C)   Patient confused this morning.  Complains that his primary doctor has not seen him.  Explained to him that he is in the hospital.  Spoke with patient's son (see discussion above).  No complaints of pain from patient.   Exam  General: Well developed, elderly man, NAD,appears stated age  83: NCAT, mucous membranes moist.   Cardiovascular: S1 S2 auscultated, tachycardic, irregular, 2/6 SEM  Respiratory: Clear to auscultation bilaterally with equal chest rise  Abdomen: Soft, nontender, nondistended, + bowel sounds  Extremities: warm dry without cyanosis clubbing or edema  Neuro: AAOx3, nonfocal  Psych: Normal affect and demeanor, pleasant  Discharge Instructions Discharge Instructions    Discharge instructions    Complete by:  As directed    Patient will be discharged to home with home health.  Patient will need to follow up with primary care provider within one week  of discharge, repeat CBC and BMP.  Follow up with Dr. Tresa Jackson on 08/03/2016 at 11:45am.  Patient should continue medications as prescribed.  Patient should follow a renal/heart healthy diet.     Current Discharge Medication List    CONTINUE these medications which have NOT CHANGED   Details  acetaminophen (TYLENOL) 500 MG tablet Take 500-1,000 mg by mouth every 6 (six) hours as needed for mild pain (depends on pain).     ALPHAGAN P 0.1 % SOLN Place 1 drop into both eyes 2 (two) times daily. Refills: 5    calcium carbonate (TUMS - DOSED IN MG ELEMENTAL CALCIUM) 500 MG chewable tablet Chew 1 tablet by mouth as needed for indigestion or heartburn.    Ferrous Sulfate (IRON) 325 (65 Fe) MG TABS Take 1 tablet by mouth 2 (two) times daily.    furosemide (LASIX) 20 MG tablet Take 20 mg by mouth every morning.    gabapentin (NEURONTIN) 300 MG capsule Take 300 mg by mouth at bedtime.    latanoprost (XALATAN) 0.005 % ophthalmic solution Place 1 drop into both eyes at bedtime.     levothyroxine (SYNTHROID, LEVOTHROID) 50 MCG tablet Take 50 mcg by mouth at bedtime.    metoprolol tartrate (LOPRESSOR) 25 MG tablet Take 12.5 mg by mouth 3 (three) times daily.    Multiple Vitamins-Minerals (ICAPS PO) Take 2 capsules by mouth daily.    tamsulosin (FLOMAX) 0.4 MG CAPS capsule Take 0.4 mg by mouth daily after supper.       Allergies  Allergen Reactions  . Codeine Nausea And Vomiting  . Ultram [Tramadol Hcl] Other (See Comments)    Confusion    Follow-up Information  [name], MD Follow up on 08/03/2016.   Specialty:  Urology Why:  at 11:45 AM for MD visit.  Contact information: [address] [place] [address] [phone]        Austin Mccreedy, MD. Schedule an appointment as soon as possible for a visit in 1 week(s).   Specialty:  Internal Medicine Why:  Hospital follow up, repeat labs Contact information: [place]  [id number] [phone]             The results of significant diagnostics from this hospitalization (including imaging, microbiology, ancillary and laboratory) are listed below for reference.    Significant Diagnostic Studies: Ct Abdomen Pelvis Wo Contrast  Result Date: 07/18/2016 CLINICAL DATA:  Elevated creatinine over the last 6 days. Elevated potassium. Scheduled for nephrectomy in 2 days. Lower abdominal pain on the left. Hematuria. Right-sided stent. Good urine output. History of bladder cancer. EXAM: CT ABDOMEN AND PELVIS WITHOUT CONTRAST TECHNIQUE: Multidetector CT imaging of the abdomen and pelvis was performed following the standard protocol without IV contrast. COMPARISON:  None. FINDINGS: Lower chest: Areas of atelectasis and scarring in the lung bases. Cardiac enlargement. Calcification in the aortic and mitral valves is well is coronary arteries. Hepatobiliary: No focal liver abnormality is seen. No gallstones, gallbladder wall thickening, or biliary dilatation. Pancreas: Pancreas is atrophic. No inflammatory change or fluid collection. Spleen: Normal in size without focal abnormality. Adrenals/Urinary Tract: No adrenal gland nodules. Right kidney demonstrates parenchymal atrophy. Prominence of the extrarenal pelvis with a ureteral stent in place. Proximal pigtail is within the renal pelvis. Distal pigtail is in the distal ureter. Calcification along the right renal pelvic wall. Subcentimeter exophytic lesions likely representing hemorrhagic cysts. Solid lesions not entirely excluded. Left kidney demonstrates diffuse parenchymal atrophy. There is hydronephrosis and hydroureter down to the level of the bladder. No obstructing stones are demonstrated. Within the dilated renal pelvis, there is a poorly defined hyperdense structure which could represent blood clot or a polypoid lesion. There is stranding around the left kidney which could be due to obstruction or infection. Subcentimeter exophytic lesion probably representing  hemorrhagic cyst. Bladder is decompressed with a Foley catheter and cannot be well evaluated. Stomach/Bowel: Stomach is within normal limits. Appendix is not identified. No evidence of bowel wall thickening, distention, or inflammatory changes. Vascular/Lymphatic: Extensive calcification throughout the abdominal aorta and branch vessels. No aneurysm. Inferior vena cava is unremarkable. No significant lymphadenopathy. Reproductive: The Foley catheter balloon appears to be at least partially within the prostate gland. Prostate enlargement, measuring 5.2 cm diameter. Other: Supraumbilical midline abdominal wall hernia containing fat. Atrophy of the abdominal wall musculature. No free air or free fluid. Musculoskeletal: Thoracolumbar scoliosis convex towards the right. Extensive degenerative changes throughout the spine. Compression of T11 vertebra. IMPRESSION: 1. Atrophy of the right kidney. Dilated extrarenal pelvis with ureteral stent in place. Distal pigtail of the stent is in the distal right ureter. Subcentimeter exophytic lesions probably representing hemorrhagic cysts. 2. Atrophy of the left kidney. Left renal hydronephrosis and hydroureter down to the level of the bladder. No obstructing stones demonstrated. Hyperdense mass or blood clot demonstrated in the left renal pelvis. Stranding around the left kidney may indicate obstruction or infection. Probable hemorrhagic cyst. 3. Bladder is decompressed with a Foley catheter and cannot be evaluated. At least part of the Foley catheter balloon appears to be within the prostate gland. Prostate enlargement. 4. Midline supraumbilical abdominal wall hernia containing fat. Electronically Signed   By: Lucienne Capers  M.D.   On: 28-Aug-202018 23:21   Dg Chest 2 View  Result Date: 07/21/2016 CLINICAL DATA:  Cough.  History of bladder carcinoma EXAM: CHEST  2 VIEW COMPARISON:  None. FINDINGS: There is a fairly small but present left pleural effusion. There is slight  bibasilar atelectasis. There is no edema or consolidation. Heart is slightly enlarged with pulmonary vascularity within normal limits. No adenopathy. There is atherosclerotic calcification in the aorta. There is marked collapse of the T11 vertebral body, age uncertain. IMPRESSION: Small left pleural effusion. Mild bibasilar atelectasis. No edema or consolidation. Mild cardiomegaly with aortic atherosclerosis. Collapse of the T11 vertebral body. Electronically Signed   By: Lowella Grip III M.D.   On: 07/21/2016 11:04    Microbiology: Recent Results (from the past 240 hour(s))  Culture, blood (Routine X 2) w Reflex to ID Panel     Status: None (Preliminary result)   Collection Time: 07/19/16  2:03 AM  Result Value Ref Range Status   Specimen Description BLOOD LEFT ANTECUBITAL  Final   Special Requests BOTTLES DRAWN AEROBIC ONLY 3CC  Final   Culture   Final    NO GROWTH 3 DAYS Performed at [facility]    Report Status PENDING  Incomplete  Culture, blood (Routine X 2) w Reflex to ID Panel     Status: None (Preliminary result)   Collection Time: 07/19/16  2:03 AM  Result Value Ref Range Status   Specimen Description BLOOD BLOOD RIGHT FOREARM  Final   Special Requests BOTTLES DRAWN AEROBIC AND ANAEROBIC 10CC  Final   Culture   Final    NO GROWTH 3 DAYS Performed at [facility]    Report Status PENDING  Incomplete  MRSA PCR Screening     Status: None   Collection Time: 07/19/16  7:00 AM  Result Value Ref Range Status   MRSA by PCR NEGATIVE NEGATIVE Final    Comment:        The GeneXpert MRSA Assay (FDA approved for NASAL specimens only), is one component of a comprehensive MRSA colonization surveillance program. It is not intended to diagnose MRSA infection nor to guide or monitor treatment for MRSA infections.   Culture, Urine     Status: None   Collection Time: 07/20/16  1:17 AM  Result Value Ref Range Status   Specimen Description URINE, RANDOM  Final    Special Requests NONE  Final   Culture NO GROWTH Performed at [facility]   Final   Report Status 07/21/2016 FINAL  Final     Labs: Basic Metabolic Panel:  Recent Labs Lab 07/19/16 2230 07/20/16 0320 07/21/16 0408 07/22/16 0617 07/23/16 0543  NA 134* 133* 136 134* 136  K 4.7 4.3 3.8 4.0 4.6  CL 107 106 109 104 107  CO2 21* 21* 21* 21* 18*  GLUCOSE 140* 110* 140* 155* 125*  BUN 69* 64* 54* 46* 45*  CREATININE 3.41* 3.04* 2.00* 1.60* 1.70*  CALCIUM 8.2* 7.9* 7.6* 8.2* 8.0*   Liver Function Tests:  Recent Labs Lab 07/18/16 2212  AST 18  ALT 10*  ALKPHOS 61  BILITOT 0.7  PROT 6.8  ALBUMIN 2.5*   No results for input(s): LIPASE, AMYLASE in the last 168 hours. No results for input(s): AMMONIA in the last 168 hours. CBC:  Recent Labs Lab 07/18/16 2212  07/19/16 0551 07/19/16 1732 07/20/16 0320 07/21/16 0408 07/22/16 0617 07/23/16 0704  WBC 11.3*  --  12.5*  --  9.6 7.3 9.7 9.2  NEUTROABS 9.2*  --   --   --   --   --   --   --   HGB 9.0*  < > 9.1* 9.5* 8.0* 8.7* 9.6* 9.9*  HCT 26.8*  < > 26.5* 28.0* 23.7* 25.7* 29.5* 29.9*  MCV 92.7  --  92.0  --  92.2 91.1 90.8 92.6  PLT 275  --  294  --  242 253 317 323  < > = values in this interval not displayed. Cardiac Enzymes: No results for input(s): CKTOTAL, CKMB, CKMBINDEX, TROPONINI in the last 168 hours. BNP: BNP (last 3 results) No results for input(s): BNP in the last 8760 hours.  ProBNP (last 3 results) No results for input(s): PROBNP in the last 8760 hours.  CBG:  Recent Labs Lab 07/22/16 1809 07/22/16 1955 07/23/16 0034 07/23/16 0350 07/23/16 0747  GLUCAP 140* 135* 134* 137* 123*    Signed:  Leinani Lisbon  [facility] 07/23/2016, 10:30 AM

## 2016-07-22 NOTE — Progress Notes (Signed)
PROGRESS NOTE    [name]  [id number] DOB: 06/21/1920 DOA: 07/18/2016 PCP: Austin Mccreedy, MD   Chief Complaint  Patient presents with  . Abnormal Lab  . Cancer     Brief Narrative:  HPI on 07/19/2016 by Dr. Gean Birchwood Austin Jackson is a 83 y.o. male with history of atrial fibrillation not on anticoagulation, CHF EF not known, hypothyroidism who had undergone recently bladder and renal biopsy for mass which turned out to be high-grade urothelial carcinoma and at the same time had stent placed with the right kidney was referred to the ER after patient's preop workup showed acute renal failure with hyperkalemia. Patient was scheduled to have surgery tomorrow. As per patient's son who is also a physician, patient has been feeling increasingly weak over the last 1 week with poor appetite. Creatinine has worsened from 1.1-3.5 with hyperkalemia. Patient denies any chest pain shortness of breath abdominal pain and diarrhea. At the time of my exam patient is mildly hypothermic with temperature of 54F and tachycardic.   CT of the abdomen and pelvis done this night shows new left renal pelvic mass versus blood clot with hydronephrosis. There is also possibility of perinephric stranding with infection. Assessment & Plan   Acute renal failure with hyperkalemia superimposed on CKD, stage secondary to New left hydronephrolosis -Creatinine baseline 1.3, peaked creatinine at 3.92 -Hyperkalemia resolved, K 4today -Creatinine today 1.6 -CT abdomen pelvis noted Left hydronephrosis -Urology consulted and appreciated, s/p cystoscopy with retrograde pyelogram, stent placement (L) -was placed on zosyn  -Continue to monitor intake/output -Repeat BMP in one week -Follow up with Dr. Tresa Moore, urology. -Will discharge patient with foley catheter  SIRS -Presented with tachycardia, leukocytosis -Leukocytosis improved -Blood cultures show to growth to date -urine culture shows no  growth -Placed on zosyn during hospitalization  -No clear source of infection. May be secondary to the above -Continue to monitor -Lactic acid normal -No UA or CXR upon admission  Atrial Fibrillation -Currently not on anticoagulation -Not rate controlled, possible due to the above -Continue metoprolol -CHADSvasc at least 4 (given age, h/o CHF, HTN)  CHF -Unknown if systolic or diastolic, no echo in Epic -Currently euvolemic  -Monitor daily weights, intake/output -Lasix initially held due to AKI, will restart today. -Follow BMP closely  Hypothyroidism -Continue synthroid  Essential hypertension -Continue metoprolol  -Resume lasix today  Pain -likely secondary to the above -continue pain control as needed  Chronic normocytic Anemia/Gross hematuria -Secondary to the above -Hemoglobin baseline around 9, currently 9.6 -Repeat CBC in one week  Mild congestion/cough -CXR showed small left pleural effusion, no infection.  -Resume lasix at discharge, follow BMP closely. -Currently patient has no leukocytosis and afebrile.   Acute delirium -Unknown cause.  Currently no source of infection.  Currently afebrile, no leukocytosis.  -Blood and urine cultures show no growth -CXR shows no infection -Spoke with patient's son. Confusion may be from change in floor during hospitalization.  Patient has been in the hospital for 4 days, however, in ICU/stepdown for 3.  Recently moved last night.  Changing patient's surroundings can certainly cause him to have AMS. Feel that patient's mental status will improve when he is back in his familiar surroundings. Son agrees.  -Offered CT head, however, if patient truly had a stroke, it would not change management. Patient is not a candidate for anticoagulation. Son agrees and declined MRI -Will continue to monitor patient.   Deconditioning -PT consulted, recommended home health  DVT Prophylaxis  SCDs  Code Status: DNR  Family  Communication: None at bedside  Disposition Plan: Admitted, Will transfer to tele.  Suspect possible discharge within 24-48 hours. Will ask PT to see.   Consultants Urology  Procedures  cystoscopy with retrograde pyelogram, stent placement (L)  Antibiotics   Anti-infectives    Start     Dose/Rate Route Frequency Ordered Stop   07/21/16 1400  piperacillin-tazobactam (ZOSYN) IVPB 3.375 g     3.375 g 12.5 mL/hr over 240 Minutes Intravenous Every 8 hours 07/21/16 1021     07/20/16 0800  cefTRIAXone (ROCEPHIN) 2 g in dextrose 5 % 50 mL IVPB  Status:  Discontinued     2 g 100 mL/hr over 30 Minutes Intravenous On call 07/19/16 1350 07/19/16 2112   07/19/16 1200  piperacillin-tazobactam (ZOSYN) IVPB 2.25 g  Status:  Discontinued     2.25 g 100 mL/hr over 30 Minutes Intravenous Every 6 hours 07/19/16 0156 07/21/16 1021   07/19/16 0200  piperacillin-tazobactam (ZOSYN) IVPB 3.375 g     3.375 g 100 mL/hr over 30 Minutes Intravenous  Once 07/19/16 0156 07/19/16 0315      Subjective:   Austin Jackson seen and examined today.  Confused this morning.   Objective:   Vitals:   07/22/16 0000 07/22/16 0051 07/22/16 0526 07/22/16 1508  BP: (!) 119/56 133/64 (!) 147/61 (!) 115/55  Pulse:  (!) 105 86 70  Resp: 18 18 20 18   Temp:  98.3 F (36.8 C) 98.9 F (37.2 C) 98.7 F (37.1 C)  TempSrc:  Oral Oral Oral  SpO2:  100% 96% 97%  Weight:      Height:        Intake/Output Summary (Last 24 hours) at 07/22/16 1521 Last data filed at 07/22/16 1048  Gross per 24 hour  Intake              580 ml  Output             1675 ml  Net            -1095 ml   Filed Weights   07/18/16 2303 07/19/16 0658  Weight: 70.3 kg (155 lb) 78.8 kg (173 lb 11.6 oz)    Exam  General: Well developed, elderly man, NAD,appears stated age  HEENT: NCAT, mucous membranes moist.   Cardiovascular: S1 S2 auscultated, tachycardic, irregular, 2/6 SEM  Respiratory: Clear to auscultation bilaterally with equal chest  rise  Abdomen: Soft, nontender, nondistended, + bowel sounds  Extremities: warm dry without cyanosis clubbing or edema  Neuro: confused. Only oriented to self.    Data Reviewed: I have personally reviewed following labs and imaging studies  CBC:  Recent Labs Lab 07/18/16 2212  07/19/16 0551 07/19/16 1732 07/20/16 0320 07/21/16 0408 07/22/16 0617  WBC 11.3*  --  12.5*  --  9.6 7.3 9.7  NEUTROABS 9.2*  --   --   --   --   --   --   HGB 9.0*  < > 9.1* 9.5* 8.0* 8.7* 9.6*  HCT 26.8*  < > 26.5* 28.0* 23.7* 25.7* 29.5*  MCV 92.7  --  92.0  --  92.2 91.1 90.8  PLT 275  --  294  --  242 253 317  < > = values in this interval not displayed. Basic Metabolic Panel:  Recent Labs Lab 07/19/16 1809 07/19/16 2230 07/20/16 0320 07/21/16 0408 07/22/16 0617  NA 133* 134* 133* 136 134*  K 5.0 4.7 4.3 3.8 4.0  CL 104  107 106 109 104  CO2 20* 21* 21* 21* 21*  GLUCOSE 242* 140* 110* 140* 155*  BUN 78* 69* 64* 54* 46*  CREATININE 3.90* 3.41* 3.04* 2.00* 1.60*  CALCIUM 8.0* 8.2* 7.9* 7.6* 8.2*   GFR: Estimated Creatinine Clearance: 29.1 mL/min (by C-G formula based on SCr of 1.6 mg/dL (H)). Liver Function Tests:  Recent Labs Lab 07/18/16 2212  AST 18  ALT 10*  ALKPHOS 61  BILITOT 0.7  PROT 6.8  ALBUMIN 2.5*   No results for input(s): LIPASE, AMYLASE in the last 168 hours. No results for input(s): AMMONIA in the last 168 hours. Coagulation Profile: No results for input(s): INR, PROTIME in the last 168 hours. Cardiac Enzymes: No results for input(s): CKTOTAL, CKMB, CKMBINDEX, TROPONINI in the last 168 hours. BNP (last 3 results) No results for input(s): PROBNP in the last 8760 hours. HbA1C: No results for input(s): HGBA1C in the last 72 hours. CBG:  Recent Labs Lab 07/21/16 2023 07/22/16 0003 07/22/16 0425 07/22/16 0723 07/22/16 1200  GLUCAP 189* 150* 149* 148* 149*   Lipid Profile: No results for input(s): CHOL, HDL, LDLCALC, TRIG, CHOLHDL, LDLDIRECT in the  last 72 hours. Thyroid Function Tests: No results for input(s): TSH, T4TOTAL, FREET4, T3FREE, THYROIDAB in the last 72 hours. Anemia Panel: No results for input(s): VITAMINB12, FOLATE, FERRITIN, TIBC, IRON, RETICCTPCT in the last 72 hours. Urine analysis: No results found for: COLORURINE, APPEARANCEUR, LABSPEC, PHURINE, GLUCOSEU, HGBUR, BILIRUBINUR, KETONESUR, PROTEINUR, UROBILINOGEN, NITRITE, LEUKOCYTESUR Sepsis Labs: @LABRCNTIP (procalcitonin:4,lacticidven:4)  ) Recent Results (from the past 240 hour(s))  Culture, blood (Routine X 2) w Reflex to ID Panel     Status: None (Preliminary result)   Collection Time: 07/19/16  2:03 AM  Result Value Ref Range Status   Specimen Description BLOOD LEFT ANTECUBITAL  Final   Special Requests BOTTLES DRAWN AEROBIC ONLY 3CC  Final   Culture   Final    NO GROWTH 3 DAYS Performed at [facility]    Report Status PENDING  Incomplete  Culture, blood (Routine X 2) w Reflex to ID Panel     Status: None (Preliminary result)   Collection Time: 07/19/16  2:03 AM  Result Value Ref Range Status   Specimen Description BLOOD BLOOD RIGHT FOREARM  Final   Special Requests BOTTLES DRAWN AEROBIC AND ANAEROBIC 10CC  Final   Culture   Final    NO GROWTH 3 DAYS Performed at [facility]    Report Status PENDING  Incomplete  MRSA PCR Screening     Status: None   Collection Time: 07/19/16  7:00 AM  Result Value Ref Range Status   MRSA by PCR NEGATIVE NEGATIVE Final    Comment:        The GeneXpert MRSA Assay (FDA approved for NASAL specimens only), is one component of a comprehensive MRSA colonization surveillance program. It is not intended to diagnose MRSA infection nor to guide or monitor treatment for MRSA infections.   Culture, Urine     Status: None   Collection Time: 07/20/16  1:17 AM  Result Value Ref Range Status   Specimen Description URINE, RANDOM  Final   Special Requests NONE  Final   Culture NO GROWTH Performed at [facility]   Final   Report Status 07/21/2016 FINAL  Final      Radiology Studies: Dg Chest 2 View  Result Date: 07/21/2016 CLINICAL DATA:  Cough.  History of bladder carcinoma EXAM: CHEST  2 VIEW COMPARISON:  None. FINDINGS:  There is a fairly small but present left pleural effusion. There is slight bibasilar atelectasis. There is no edema or consolidation. Heart is slightly enlarged with pulmonary vascularity within normal limits. No adenopathy. There is atherosclerotic calcification in the aorta. There is marked collapse of the T11 vertebral body, age uncertain. IMPRESSION: Small left pleural effusion. Mild bibasilar atelectasis. No edema or consolidation. Mild cardiomegaly with aortic atherosclerosis. Collapse of the T11 vertebral body. Electronically Signed   By: Lowella Grip III M.D.   On: 07/21/2016 11:04     Scheduled Meds: . ferrous sulfate  325 mg Oral BID WC  . gabapentin  300 mg Oral QHS  . latanoprost  1 drop Both Eyes QHS  . levothyroxine  50 mcg Oral QHS  . metoprolol tartrate  12.5 mg Oral TID  . piperacillin-tazobactam (ZOSYN)  IV  3.375 g Intravenous Q8H  . tamsulosin  0.4 mg Oral QPC supper   Continuous Infusions:    LOS: 4 days   Time Spent in minutes   30 minutes  Latia Mataya D.O. on 07/22/2016 at 3:21 PM  Between 7am to 7pm - Pager - [phone]  After 7pm go to [email or web address] - password [email or web address]  And look for the night coverage person covering for me after hours  [facility] Office  [phone]

## 2016-07-22 NOTE — Evaluation (Signed)
Physical Therapy Evaluation Patient Details Name: Austin Jackson MRN: [id number] DOB: 06-25-1920 Today's Date: 07/22/2016   History of Present Illness  Austin Jackson is a 83 y.o. male with history of atrial fibrillation not on anticoagulation, CHF EF not known, hypothyroidism who had undergone recently bladder and renal biopsy for mass which turned out to be high-grade urothelial carcinoma and at the same time had stent placed with the right kidney was referred to the ER on 07/19/2016 (this admission) after patient's preop workup showed acute renal failure with hyperkalemia. Patient was scheduled to have surgery 07/20/16 and continued to have surgery during this admission for Left hydronephrosis, acute renal failure with hyperkalemia, h/o bladder cancer and R uteral cancer with cystoscopy and insertion of Left ureteral stent.  As per patient's son who is also a physician, patient has been feeling increasingly weak over the last 1 week with poor appetite. Creatinine has worsened from 1.1-3.5 with hyperkalemia. Currently Cr continues to improve, transferred from ICU in middle of night last night, and son stated he had increased confusion this morning. It is clearing a bit at the moment (midday ) today 07/22/2016.      Clinical Impression  Pt with weakness and decreased ability with mobility today during our session. Per patient and pt's son, this is no where near baseline, however pt had increased confusion this morning (improved since then) and not in a familiar environment with low vision. Pt reports increaed difficulty with his vision , stating his right eye is almost bind since last few days. Noticed very large pupils during our session together today.   Pt to benefit from continued PT while here in acute care, son states he will be here with pt until Wed, however would like patient to be more steady and able on his feet prior to DC.  After son leaves on Wed, feel pt may need advice on increased care  in the home for safety and assistance. Recommend HHPT at this time hoping for improvement today and while here .     Follow Up Recommendations Home health PT;Supervision/Assistance - 24 hour (pt may need some caregiver assist until regains strength )    Equipment Recommendations   (pt states he has a RW, if not, he needs one. )    Recommendations for Other Services       Precautions / Restrictions        Mobility  Bed Mobility Overal bed mobility: Needs Assistance Bed Mobility: Supine to Sit;Sit to Supine     Supine to sit: Min assist     General bed mobility comments: to help scoot to edge of bed, however them he told me to move away and he was able to perform the rest independently.   Transfers Overall transfer level: Needs assistance Equipment used: Rolling walker (2 wheeled) Transfers: Sit to/from Stand Sit to Stand: Min guard         General transfer comment: he could rise and lower with min guard , however cued for safety cues for proper use of RW , etc, however he seemed to have his way of turning around to back up to the chair that would not seem to be safe, however performed it several times and with no LOB or unsurity .   Ambulation/Gait Ambulation/Gait assistance: Min assist Ambulation Distance (Feet): 50 Feet Assistive device: Rolling walker (2 wheeled);Straight cane Gait Pattern/deviations: Trunk flexed     General Gait Details: tried RW to start with  due to a  little unsteady with gait, however pushes RW tooo far ahead of him to be useful, however would respond to cues to get closer to RW, but never stood whin the RW. He was unfamilar with the RW, so we tried a striaght cane, howevver with his current weakness the staright cane did not offer enough support so he opted back to try the RW. patient is very strong willled. Then after sitting for a moment, we did walk to the bathroom with the RW and pateints use improved slightly.   Stairs             Wheelchair Mobility    Modified Rankin (Stroke Patients Only)       Balance                                             Pertinent Vitals/Pain Pain Assessment: No/denies pain    Home Living Family/patient expects to be discharged to:: Private residence Living Arrangements: Spouse/significant other (his wife uses a RW for mobility) Available Help at Discharge: Available PRN/intermittently;Family (son from [place] will be here until 07/26/2016, then pateint will be home alone with wife, unless family can arrange for increased help. Son who is a MD in [place] may be coming to visit as well. ) Type of Home: House Home Access: Stairs to enter Entrance Stairs-Rails: Right Entrance Stairs-Number of Steps: 2 (uses rail , " I do just fine" ) Home Layout: One level Home Equipment: Cane - single point;Walker - 2 wheels Additional Comments: very difficult to know if information was accurate.     Prior Function Level of Independence: Independent with assistive device(s)         Comments: Pt stated he held on to furniture and used a cane to get around the house. They have meals on wheels come 1x a day (pt feels this offers a set of eyes to check on them for not family is near) . Pt would like to increese this to 2x a day.      Hand Dominance        Extremity/Trunk Assessment        Lower Extremity Assessment Lower Extremity Assessment: Overall WFL for tasks assessed    Cervical / Trunk Assessment Cervical / Trunk Assessment: Kyphotic  Communication   Communication: No difficulties  Cognition Arousal/Alertness: Awake/alert Behavior During Therapy: WFL for tasks assessed/performed Overall Cognitive Status: Within Functional Limits for tasks assessed                 General Comments: A & O x 4 , however reasoning for safety is my concern. Diffiluty with walking at the moment , but states " I can do this at home, no problem". Everything seemed to  be difficult here but pateint states it will not be at home. There may be some truth to that for the pateint has poor vision and in a more familiar environment he may perform better.     General Comments      Exercises     Assessment/Plan    PT Assessment Patient needs continued PT services  PT Problem List Decreased strength;Decreased activity tolerance;Decreased safety awareness          PT Treatment Interventions Gait training;Functional mobility training;Therapeutic activities;Patient/family education    PT Goals (Current goals can be found in the Care Plan section)  Acute Rehab PT Goals Patient  Stated Goal: I don't know why they are holding me here.  PT Goal Formulation: With patient Time For Goal Achievement: 08/05/16 Potential to Achieve Goals: Good    Frequency Min 3X/week   Barriers to discharge        Co-evaluation               End of Session   Activity Tolerance: Patient tolerated treatment well Patient left: in chair;with call bell/phone within reach;with family/visitor present Nurse Communication: Mobility status         Time: 1230-1318 PT Time Calculation (min) (ACUTE ONLY): 48 min   Charges:   PT Evaluation $PT Eval Moderate Complexity: 1 Procedure PT Treatments $Gait Training: 8-22 mins $Therapeutic Activity: 8-22 mins   PT G CodesClide Dales 08/12/2016, 2:23 PM  Clide Dales, PT Pager: [phone] August 12, 2016

## 2016-07-22 NOTE — Progress Notes (Signed)
Patient transferred from ICU in stable condition. No c/o pain.  Vitals stable.

## 2016-07-22 NOTE — Discharge Instructions (Signed)
Acute Kidney Injury, Adult °Acute kidney injury (AKI) occurs when there is sudden (acute) damage to the kidneys. A small amount of kidney damage may not cause problems, but a large amount of damage may make it difficult or impossible for the kidneys to work the way they should. AKI may develop into long-lasting (chronic) kidney disease. Early detection and treatment of AKI may prevent kidney damage from becoming permanent or getting worse. °What are the causes? °Common causes of this condition include: °· A problem with blood flow to the kidneys. This may be caused by: °¨ Blood loss. °¨ Heart and blood vessel (cardiovascular) disease. °¨ Severe burns. °¨ Liver disease. °· Direct damage to the kidneys. This may be caused by: °¨ Some medicines. °¨ A kidney infection. °¨ Poisoning. °¨ Being around or in contact with poisonous (toxic) substances. °¨ A surgical wound. °¨ A hard, direct force to the kidney area. °· A sudden blockage of urine flow. This may be caused by: °¨ Cancer. °¨ Kidney stones. °¨ Enlarged prostate in males. °What are the signs or symptoms? °Symptoms develop slowly and may not be obvious until the kidney damage becomes severe. It is possible to have AKI for years without showing any symptoms. Symptoms of this condition can include: °· Swelling (edema) of the face, legs, ankles, or feet. °· Numbness, tingling, or loss of feeling (sensation) in the hands or feet. °· Tiredness (lethargy). °· Nausea or vomiting. °· Confusion or trouble concentrating. °· Problems with urination, such as: °¨ Painful or burning feeling during urination. °¨ Decreased urine production. °¨ Frequent urination, especially at night. °¨ Bloody urine. °· Muscle twitches and cramps, especially in the legs. °· Shortness of breath. °· Weakness. °· Constant itchiness. °· Loss of appetite. °· Metallic taste in the mouth. °· Trouble sleeping. °· Pale lining of the eyelids and surface of the eye (conjunctiva). °How is this diagnosed? °This  condition may be diagnosed with various tests. Tests may include: °· Blood tests. °· Urine tests. °· Imaging tests. °· A test in which a sample of tissue is removed from the kidneys to be looked at under a microscope (kidney biopsy). °How is this treated? °Treatment of AKI varies depending on the cause and severity of the kidney damage. In mild cases, treatment may not be needed. The kidneys may heal on their own. °If AKI is more severe, your health care provider will treat the cause of the kidney damage, help the kidneys heal, and prevent problems from occurring. Severe cases may require a procedure to remove toxic wastes from the body (dialysis) or surgery to repair kidney damage. Surgery may involve: °· Repair of a torn kidney. °· Removal of a urine flow obstruction. °Follow these instructions at home: °· Follow your prescribed diet. °· Take over-the-counter and prescription medicines only as told by your health care provider. °¨ Do not take any new medicines unless approved by your health care provider. Many medicines can worsen your kidney damage. °¨ Do not take any vitamin and mineral supplements unless approved by your health care provider. Many nutritional supplements can worsen your kidney damage. °¨ The dose of some medicines that you take may need to be adjusted. °· Do not use any tobacco products, such as cigarettes, chewing tobacco, and e-cigarettes. If you need help quitting, ask your health care provider. °· Keep all follow-up visits as told by your health care provider. This is important. °· Keep track of your blood pressure. Report changes in your blood pressure as told by your   health care provider. °· Achieve and maintain a healthy weight. If you need help with this, ask your health care provider. °· Start or continue an exercise plan. Try to exercise at least 30 minutes a day, 5 days a week. °· Stay current with immunizations as told by your health care provider. °Where to find more  information: °· American Association of Kidney Patients: www.aakp.org °· National Kidney Foundation: www.kidney.org °· American Kidney Fund: www.akfinc.org °· Life [employer] Program: www.lifeoptions.org and www.kidneyschool.org °Contact a health care provider if: °· Your symptoms get worse. °· You develop new symptoms. °Get help right away if: °· You develop symptoms of end-stage kidney disease, which include: °¨ Headaches. °¨ Abnormally dark or light skin. °¨ Numbness in the hands or feet. °¨ Easy bruising. °¨ Frequent hiccups. °¨ Chest pain. °¨ Shortness of breath. °¨ End of menstruation in women. °· You have a fever. °· You have decreased urine production. °· You have pain or bleeding when you urinate. °This information is not intended to replace advice given to you by your health care provider. Make sure you discuss any questions you have with your health care provider. °Document Released: 01/09/2011 Document Revised: 02/03/2016 Document Reviewed: 02/23/2012 °Elsevier Interactive Patient Education © 2017 Elsevier Inc. ° ° °Hydronephrosis °Introduction °Hydronephrosis is the enlargement of a kidney due to a blockage that stops urine from flowing out of the body. °What are the causes? °Common causes of this condition include: °· A birth (congenital) defect of the kidney. °· A congenital defect of the tube through which urine travels (ureter). °· Kidney stones. °· An enlarged prostate gland. °· A tumor. °· Cancer of the prostate, bladder, uterus, ovary, or colon. °· A blood clot. °What are the signs or symptoms? °Symptoms of this condition include: °· Pain or discomfort in your side (flank). °· Swelling of the abdomen. °· Pain in the abdomen. °· Nausea and vomiting. °· Fever. °· Pain while passing urine. °· Feeling of urgency to urinate. °· Frequent urination. °· Infection of the urinary tract. °In some cases, there are no symptoms. °How is this diagnosed? °This condition may be diagnosed with: °· A  medical history. °· A physical exam. °· Blood and urine tests to check kidney function. °· Imaging tests, such as an X-ray, ultrasound, CT scan, or MRI. °· A test in which a rigid or flexible telescope (cystoscope) is used to view the site of the blockage. °How is this treated? °Treatment for this condition depends on where the blockage is located, how long it has been there, and what caused it. The goal of treatment is to remove the blockage. Treatment options include: °· A procedure to put in a soft tube to help drain urine. °· Antibiotic medicines to treat or prevent infection. °· Shock-wave therapy (lithotripsy) to help eliminate kidney stones. °Follow these instructions at home: °· Get lots of rest. °· Drink enough fluid to keep your urine clear or pale yellow. °· If you have a drain in, follow your health care provider's instructions about how to care for it. °· Take medicines only as directed by your health care provider. °· If you were prescribed an antibiotic medicine, finish all of it even if you start to feel better. °· Keep all follow-up visits as directed by your health care provider. This is important. °Contact a health care provider if: °· You continue to have symptoms after treatment. °· You develop new symptoms. °· You have a problem with a drainage device. °· Your urine   becomes cloudy or bloody. °· You have a fever. °Get help right away if: °· You have severe flank or abdominal pain. °· You develop vomiting and are unable to keep fluids down. °This information is not intended to replace advice given to you by your health care provider. Make sure you discuss any questions you have with your health care provider. °Document Released: 04/23/2007 Document Revised: 12/02/2015 Document Reviewed: 06/22/2014 °© 2017 Elsevier ° ° °

## 2016-07-23 LAB — BASIC METABOLIC PANEL
Anion gap: 11 (ref 5–15)
BUN: 45 mg/dL — AB (ref 6–20)
CHLORIDE: 107 mmol/L (ref 101–111)
CO2: 18 mmol/L — ABNORMAL LOW (ref 22–32)
CREATININE: 1.7 mg/dL — AB (ref 0.61–1.24)
Calcium: 8 mg/dL — ABNORMAL LOW (ref 8.9–10.3)
GFR calc Af Amer: 38 mL/min — ABNORMAL LOW (ref >60)
GFR calc non Af Amer: 33 mL/min — ABNORMAL LOW (ref >60)
GLUCOSE: 125 mg/dL — AB (ref 65–99)
Potassium: 4.6 mmol/L (ref 3.5–5.1)
Sodium: 136 mmol/L (ref 135–145)

## 2016-07-23 LAB — GLUCOSE, CAPILLARY
GLUCOSE-CAPILLARY: 134 mg/dL — AB (ref 65–99)
Glucose-Capillary: 137 mg/dL — ABNORMAL HIGH (ref 65–99)
Glucose-Capillary: 123 mg/dL — ABNORMAL HIGH (ref 65–99)
Glucose-Capillary: 152 mg/dL — ABNORMAL HIGH (ref 65–99)

## 2016-07-23 LAB — CBC
HEMATOCRIT: 29.9 % — AB (ref 39.0–52.0)
HEMOGLOBIN: 9.9 g/dL — AB (ref 13.0–17.0)
MCH: 30.7 pg (ref 26.0–34.0)
MCHC: 33.1 g/dL (ref 30.0–36.0)
MCV: 92.6 fL (ref 78.0–100.0)
Platelets: 323 10*3/uL (ref 150–400)
RBC: 3.23 MIL/uL — AB (ref 4.22–5.81)
RDW: 15 % (ref 11.5–15.5)
WBC: 9.2 10*3/uL (ref 4.0–10.5)

## 2016-07-23 LAB — TYPE AND SCREEN
ABO/RH(D): A POS
Antibody Screen: NEGATIVE
UNIT DIVISION: 0
Unit division: 0

## 2016-07-23 LAB — PROCALCITONIN: Procalcitonin: 0.12 ng/mL

## 2016-07-23 NOTE — Care Management Note (Signed)
Case Management Note  Patient Details  Name: Austin Jackson MRN: [id number] Date of Birth: 03/05/1920  Subjective/Objective:                  Acute renal failure   Action/Plan: CM spoke with the patient and his son at the bedside. Patient provided with a list of home health providers for [facility]. Patient states any would be fine, his son agrees. [facility] selected due to patient's request for a [facility] agency closer to his location. Jermaine with [facility] notified of the referral for HHPT.   Expected Discharge Date:  07/23/16               Expected Discharge Plan:  [place]  In-House Referral:     Discharge planning Services  CM Consult  Post Acute Care Choice:  Home Health Choice offered to:  Patient  DME Arranged:  N/A DME Agency:  NA  HH Arranged:  PT [place] Agency:  [place]  Status of Service:  Completed, signed off  If discussed at [place] of Stay Meetings, dates discussed:    Additional Comments:  Apolonio Schneiders, RN 07/23/2016, 11:39 AM

## 2016-07-23 NOTE — Progress Notes (Signed)
Physical Therapy Treatment Patient Details Name: Austin Jackson MRN: [id number] DOB: Feb 13, 1920 Today's Date: 07/23/2016    History of Present Illness Austin Jackson is a 83 y.o. male with history of atrial fibrillation not on anticoagulation, CHF EF not known, hypothyroidism who had undergone recently bladder and renal biopsy for mass which turned out to be high-grade urothelial carcinoma and at the same time had stent placed with the right kidney was referred to the ER on 07/19/2016 (this admission) after patient's preop workup showed acute renal failure with hyperkalemia. Patient was scheduled to have surgery 07/20/16 and continued to have surgery during this admission for Left hydronephrosis, acute renal failure with hyperkalemia, h/o bladder cancer and R uteral cancer with cystoscopy and insertion of Left ureteral stent.  As per patient's son who is also a physician, patient has been feeling increasingly weak over the last 1 week with poor appetite. Creatinine has worsened from 1.1-3.5 with hyperkalemia. Currently Cr continues to improve, transferred from ICU in middle of night last night, and son stated he had increased confusion this morning. It is clearing a bit at the moment (midday ) today 07/22/2016.     PT Comments    Pt increased ambulation today to 50' with RW with MIN A to position RW closer to him.  Son reports walking was much improved since yesterday and he was educated on safety with RW and gait.  Educated son on adjusting RW at home.  Pt and son were both educated on safety with stairs, but they declined practicing. Pt demonstrated some confusion at times thinking that dreams he had had actually happened, but otherwise, he was appropriate to the situation.  All questions answered to their satisfaction.  Recommend HHPT and 24 hour A at this time.   Follow Up Recommendations  Home health PT;Supervision/Assistance - 24 hour     Equipment Recommendations  None recommended by PT     Recommendations for Other Services       Precautions / Restrictions Precautions Precautions: Fall Restrictions Weight Bearing Restrictions: No    Mobility  Bed Mobility   Bed Mobility: Supine to Sit     Supine to sit: Modified independent (Device/Increase time)     General bed mobility comments: pt able to come to EOB with bed flat.  Cues to get feet on the floor prior to transfer, but no physical A given  Transfers Overall transfer level: Needs assistance Equipment used: Rolling walker (2 wheeled) Transfers: Sit to/from Stand Sit to Stand: Min guard         General transfer comment: cues for proper hand placement.  Pt demonstrated good awareness to feel recliner on back of his legs and reaching back prior to sitting  Ambulation/Gait Ambulation/Gait assistance: Min assist Ambulation Distance (Feet): 84 Feet Assistive device: Rolling walker (2 wheeled) Gait Pattern/deviations: Trunk flexed     General Gait Details: Pt did not argue about using RW today.  He keeps to far forward and PT needed to hold onto it to keep it closer.  With cues he would get closer, but them immediately it got too far away.  Needed constant cues for postioning.   Stairs            Wheelchair Mobility    Modified Rankin (Stroke Patients Only)       Balance Overall balance assessment: Needs assistance Sitting-balance support: Feet unsupported Sitting balance-Leahy Scale: Good     Standing balance support: Bilateral upper extremity supported Standing balance-Leahy Scale: Poor  Cognition Arousal/Alertness: Awake/alert Behavior During Therapy: WFL for tasks assessed/performed Overall Cognitive Status: Within Functional Limits for tasks assessed                 General Comments: Alert and oriented, but at times would talk about things that didn't happen,.  Thinking his dreams had really occured.    Exercises      General Comments General  comments (skin integrity, edema, etc.): Son from [facility] present and educated him on adjusting RW.  Educated pt and son on stairs, but they deferred practicing.      Pertinent Vitals/Pain Pain Assessment: No/denies pain    Home Living                      Prior Function            PT Goals (current goals can now be found in the care plan section) Acute Rehab PT Goals Patient Stated Goal: "I'm supposed to go home, but I need to be disconnected." PT Goal Formulation: With patient/family Time For Goal Achievement: 08/05/16 Potential to Achieve Goals: Good Progress towards PT goals: Progressing toward goals    Frequency    Min 3X/week      PT Plan Current plan remains appropriate    Co-evaluation             End of Session Equipment Utilized During Treatment: Gait belt Activity Tolerance: Patient tolerated treatment well Patient left: in chair;with call bell/phone within reach;with family/visitor present     Time: [id number] PT Time Calculation (min) (ACUTE ONLY): 28 min  Charges:  $Gait Training: 8-22 mins $Therapeutic Activity: 8-22 mins                    G Codes:      Delores Thelen LUBECK 07/23/2016, 11:20 AM

## 2016-07-24 LAB — CULTURE, BLOOD (ROUTINE X 2)
CULTURE: NO GROWTH
CULTURE: NO GROWTH

## 2016-07-26 DIAGNOSIS — I509 Heart failure, unspecified: Secondary | ICD-10-CM | POA: Diagnosis not present

## 2016-07-26 DIAGNOSIS — D509 Iron deficiency anemia, unspecified: Secondary | ICD-10-CM | POA: Diagnosis not present

## 2016-07-26 DIAGNOSIS — I130 Hypertensive heart and chronic kidney disease with heart failure and stage 1 through stage 4 chronic kidney disease, or unspecified chronic kidney disease: Secondary | ICD-10-CM | POA: Diagnosis not present

## 2016-07-26 DIAGNOSIS — C661 Malignant neoplasm of right ureter: Secondary | ICD-10-CM | POA: Diagnosis not present

## 2016-07-26 DIAGNOSIS — N1330 Unspecified hydronephrosis: Secondary | ICD-10-CM | POA: Diagnosis not present

## 2016-07-26 DIAGNOSIS — I482 Chronic atrial fibrillation: Secondary | ICD-10-CM | POA: Diagnosis not present

## 2016-07-26 DIAGNOSIS — N183 Chronic kidney disease, stage 3 (moderate): Secondary | ICD-10-CM | POA: Diagnosis not present

## 2016-07-26 DIAGNOSIS — R262 Difficulty in walking, not elsewhere classified: Secondary | ICD-10-CM | POA: Diagnosis not present

## 2016-07-26 DIAGNOSIS — Z8551 Personal history of malignant neoplasm of bladder: Secondary | ICD-10-CM | POA: Diagnosis not present

## 2016-07-28 DIAGNOSIS — N183 Chronic kidney disease, stage 3 (moderate): Secondary | ICD-10-CM | POA: Diagnosis not present

## 2016-07-28 DIAGNOSIS — I130 Hypertensive heart and chronic kidney disease with heart failure and stage 1 through stage 4 chronic kidney disease, or unspecified chronic kidney disease: Secondary | ICD-10-CM | POA: Diagnosis not present

## 2016-07-28 DIAGNOSIS — C661 Malignant neoplasm of right ureter: Secondary | ICD-10-CM | POA: Diagnosis not present

## 2016-07-28 DIAGNOSIS — N1330 Unspecified hydronephrosis: Secondary | ICD-10-CM | POA: Diagnosis not present

## 2016-07-28 DIAGNOSIS — R262 Difficulty in walking, not elsewhere classified: Secondary | ICD-10-CM | POA: Diagnosis not present

## 2016-07-28 DIAGNOSIS — I509 Heart failure, unspecified: Secondary | ICD-10-CM | POA: Diagnosis not present

## 2016-07-31 DIAGNOSIS — N1330 Unspecified hydronephrosis: Secondary | ICD-10-CM | POA: Diagnosis not present

## 2016-07-31 DIAGNOSIS — I130 Hypertensive heart and chronic kidney disease with heart failure and stage 1 through stage 4 chronic kidney disease, or unspecified chronic kidney disease: Secondary | ICD-10-CM | POA: Diagnosis not present

## 2016-07-31 DIAGNOSIS — C661 Malignant neoplasm of right ureter: Secondary | ICD-10-CM | POA: Diagnosis not present

## 2016-07-31 DIAGNOSIS — I509 Heart failure, unspecified: Secondary | ICD-10-CM | POA: Diagnosis not present

## 2016-07-31 DIAGNOSIS — R262 Difficulty in walking, not elsewhere classified: Secondary | ICD-10-CM | POA: Diagnosis not present

## 2016-07-31 DIAGNOSIS — N183 Chronic kidney disease, stage 3 (moderate): Secondary | ICD-10-CM | POA: Diagnosis not present

## 2016-08-02 DIAGNOSIS — R262 Difficulty in walking, not elsewhere classified: Secondary | ICD-10-CM | POA: Diagnosis not present

## 2016-08-02 DIAGNOSIS — I509 Heart failure, unspecified: Secondary | ICD-10-CM | POA: Diagnosis not present

## 2016-08-02 DIAGNOSIS — I130 Hypertensive heart and chronic kidney disease with heart failure and stage 1 through stage 4 chronic kidney disease, or unspecified chronic kidney disease: Secondary | ICD-10-CM | POA: Diagnosis not present

## 2016-08-02 DIAGNOSIS — N1330 Unspecified hydronephrosis: Secondary | ICD-10-CM | POA: Diagnosis not present

## 2016-08-02 DIAGNOSIS — C661 Malignant neoplasm of right ureter: Secondary | ICD-10-CM | POA: Diagnosis not present

## 2016-08-02 DIAGNOSIS — N183 Chronic kidney disease, stage 3 (moderate): Secondary | ICD-10-CM | POA: Diagnosis not present

## 2016-08-03 DIAGNOSIS — D4411 Neoplasm of uncertain behavior of right adrenal gland: Secondary | ICD-10-CM | POA: Diagnosis not present

## 2016-08-03 DIAGNOSIS — C651 Malignant neoplasm of right renal pelvis: Secondary | ICD-10-CM | POA: Diagnosis not present

## 2016-08-03 DIAGNOSIS — D4412 Neoplasm of uncertain behavior of left adrenal gland: Secondary | ICD-10-CM | POA: Diagnosis not present

## 2016-08-03 DIAGNOSIS — C672 Malignant neoplasm of lateral wall of bladder: Secondary | ICD-10-CM | POA: Diagnosis not present

## 2016-08-03 DIAGNOSIS — N134 Hydroureter: Secondary | ICD-10-CM | POA: Diagnosis not present

## 2016-08-03 DIAGNOSIS — N261 Atrophy of kidney (terminal): Secondary | ICD-10-CM | POA: Diagnosis not present

## 2016-08-07 DIAGNOSIS — I509 Heart failure, unspecified: Secondary | ICD-10-CM | POA: Diagnosis not present

## 2016-08-07 DIAGNOSIS — N1330 Unspecified hydronephrosis: Secondary | ICD-10-CM | POA: Diagnosis not present

## 2016-08-07 DIAGNOSIS — R262 Difficulty in walking, not elsewhere classified: Secondary | ICD-10-CM | POA: Diagnosis not present

## 2016-08-07 DIAGNOSIS — N183 Chronic kidney disease, stage 3 (moderate): Secondary | ICD-10-CM | POA: Diagnosis not present

## 2016-08-07 DIAGNOSIS — I130 Hypertensive heart and chronic kidney disease with heart failure and stage 1 through stage 4 chronic kidney disease, or unspecified chronic kidney disease: Secondary | ICD-10-CM | POA: Diagnosis not present

## 2016-08-07 DIAGNOSIS — C661 Malignant neoplasm of right ureter: Secondary | ICD-10-CM | POA: Diagnosis not present

## 2016-08-10 DIAGNOSIS — C661 Malignant neoplasm of right ureter: Secondary | ICD-10-CM | POA: Diagnosis not present

## 2016-08-10 DIAGNOSIS — I130 Hypertensive heart and chronic kidney disease with heart failure and stage 1 through stage 4 chronic kidney disease, or unspecified chronic kidney disease: Secondary | ICD-10-CM | POA: Diagnosis not present

## 2016-08-10 DIAGNOSIS — I509 Heart failure, unspecified: Secondary | ICD-10-CM | POA: Diagnosis not present

## 2016-08-10 DIAGNOSIS — N1330 Unspecified hydronephrosis: Secondary | ICD-10-CM | POA: Diagnosis not present

## 2016-08-10 DIAGNOSIS — R262 Difficulty in walking, not elsewhere classified: Secondary | ICD-10-CM | POA: Diagnosis not present

## 2016-08-10 DIAGNOSIS — N183 Chronic kidney disease, stage 3 (moderate): Secondary | ICD-10-CM | POA: Diagnosis not present

## 2016-08-14 DIAGNOSIS — H401233 Low-tension glaucoma, bilateral, severe stage: Secondary | ICD-10-CM | POA: Diagnosis not present

## 2016-08-16 ENCOUNTER — Other Ambulatory Visit: Payer: Self-pay | Admitting: Urology

## 2016-09-01 NOTE — Patient Instructions (Addendum)
Austin Jackson  09/01/2016   Your procedure is scheduled on: 09-15-16  Report to [facility] Main  Entrance take [facility]  elevators to 3rd floor to  [name] at 100 PM  Call this number if you have problems the morning of surgery [phone]   Remember: ONLY 1 PERSON MAY GO WITH YOU TO SHORT STAY TO GET  READY MORNING OF [name].  Do not eat food :After Midnight. clear liquids until 900 am day of surgery-then nothing after 900 am day of surgery               TAKE THE FOLLOWING MEDICATIONS MORNING OF SURGERY WITH A SIP OF WATER ONLY: ALPHAGAN EYE DROP, METOPROLOL TARTRATE                                You may not have any metal on your body including hair pins and              piercings  Do not wear jewelry, make-up, lotions, powders or perfumes, deodorant             Do not wear nail polish.  Do not shave  48 hours prior to surgery.              Men may shave face and neck.   Do not bring valuables to the hospital. [name].  Contacts, dentures or bridgework may not be worn into surgery.  Leave suitcase in the car. After surgery it may be brought to your room.     Patients discharged the day of surgery will not be all-owed to drive home. - Name and phone number of your driver: [name] son  cell [phone]              Please read over the following fact sheets you were given: _____________________________________________________________________                CLEAR LIQUID DIET   Foods Allowed                                                                     Foods Excluded  Coffee and tea, regular and decaf                             liquids that you cannot  Plain Jell-O in any flavor                                             see through such as: Fruit ices (not with fruit pulp)                                     milk, soups, orange juice  Iced Popsicles  All solid food Carbonated beverages, regular and diet                                    Cranberry, grape and apple juices Sports drinks like Gatorade Lightly seasoned clear broth or consume(fat free) Sugar, honey syrup  Sample Menu Breakfast                                Lunch                                     Supper Cranberry juice                    Beef broth                            Chicken broth Jell-O                                     Grape juice                           Apple juice Coffee or tea                        Jell-O                                      Popsicle                                                Coffee or tea                        Coffee or tea  _____________________________________________________________________  [name] - Preparing for Surgery Before surgery, you can play an important role.  Because skin is not sterile, your skin needs to be as free of germs as possible.  You can reduce the number of germs on your skin by washing with CHG (chlorahexidine gluconate) soap before surgery.  CHG is an antiseptic cleaner which kills germs and bonds with the skin to continue killing germs even after washing. Please DO NOT use if you have an allergy to CHG or antibacterial soaps.  If your skin becomes reddened/irritated stop using the CHG and inform your nurse when you arrive at Short Stay. Do not shave (including legs and underarms) for at least 48 hours prior to the first CHG shower.  You may shave your face/neck. Please follow these instructions carefully:  1.  Shower with CHG Soap the night before surgery and the  morning of Surgery.  2.  If you choose to wash your hair, wash your hair first as usual with your  normal  shampoo.  3.  After you shampoo, rinse your hair and body thoroughly to remove the  shampoo.  4.  Use CHG as you would any other liquid soap.  You can apply chg directly  to the skin and wash                        Gently with a scrungie or clean washcloth.  5.  Apply the CHG Soap to your body ONLY FROM THE NECK DOWN.   Do not use on face/ open                           Wound or open sores. Avoid contact with eyes, ears mouth and genitals (private parts).                       Wash face,  Genitals (private parts) with your normal soap.             6.  Wash thoroughly, paying special attention to the area where your surgery  will be performed.  7.  Thoroughly rinse your body with warm water from the neck down.  8.  DO NOT shower/wash with your normal soap after using and rinsing off  the CHG Soap.                9.  Pat yourself dry with a clean towel.            10.  Wear clean pajamas.            11.  Place clean sheets on your bed the night of your first shower and do not  sleep with pets. Day of Surgery : Do not apply any lotions/deodorants the morning of surgery.  Please wear clean clothes to the hospital/surgery center.  FAILURE TO FOLLOW THESE INSTRUCTIONS MAY RESULT IN THE CANCELLATION OF YOUR SURGERY PATIENT SIGNATURE_________________________________  NURSE SIGNATURE__________________________________  ________________________________________________________________________

## 2016-09-05 ENCOUNTER — Encounter (HOSPITAL_COMMUNITY): Payer: Self-pay

## 2016-09-05 ENCOUNTER — Encounter (HOSPITAL_COMMUNITY)
Admission: RE | Admit: 2016-09-05 | Discharge: 2016-09-05 | Disposition: A | Discharge status: Home / Self Care | Payer: Medicare Other | Source: Ambulatory Visit | Attending: Urology | Admitting: Urology

## 2016-09-05 ENCOUNTER — Encounter (INDEPENDENT_AMBULATORY_CARE_PROVIDER_SITE_OTHER): Payer: Self-pay

## 2016-09-05 DIAGNOSIS — D508 Other iron deficiency anemias: Secondary | ICD-10-CM | POA: Diagnosis not present

## 2016-09-05 DIAGNOSIS — H4089 Other specified glaucoma: Secondary | ICD-10-CM | POA: Diagnosis not present

## 2016-09-05 DIAGNOSIS — C679 Malignant neoplasm of bladder, unspecified: Secondary | ICD-10-CM | POA: Insufficient documentation

## 2016-09-05 DIAGNOSIS — I5032 Chronic diastolic (congestive) heart failure: Secondary | ICD-10-CM | POA: Diagnosis not present

## 2016-09-05 DIAGNOSIS — E038 Other specified hypothyroidism: Secondary | ICD-10-CM | POA: Diagnosis not present

## 2016-09-05 DIAGNOSIS — I481 Persistent atrial fibrillation: Secondary | ICD-10-CM | POA: Diagnosis not present

## 2016-09-05 DIAGNOSIS — Z01812 Encounter for preprocedural laboratory examination: Secondary | ICD-10-CM | POA: Insufficient documentation

## 2016-09-05 DIAGNOSIS — N400 Enlarged prostate without lower urinary tract symptoms: Secondary | ICD-10-CM | POA: Diagnosis not present

## 2016-09-05 HISTORY — DX: Heart failure, unspecified: I50.9

## 2016-09-05 LAB — BASIC METABOLIC PANEL
Anion gap: 5 (ref 5–15)
BUN: 52 mg/dL — ABNORMAL HIGH (ref 6–20)
CALCIUM: 9 mg/dL (ref 8.9–10.3)
CO2: 25 mmol/L (ref 22–32)
CREATININE: 1.6 mg/dL — AB (ref 0.61–1.24)
Chloride: 105 mmol/L (ref 101–111)
GFR calc Af Amer: 41 mL/min — ABNORMAL LOW (ref >60)
GFR, EST NON AFRICAN AMERICAN: 35 mL/min — AB (ref >60)
GLUCOSE: 127 mg/dL — AB (ref 65–99)
Potassium: 5 mmol/L (ref 3.5–5.1)
Sodium: 135 mmol/L (ref 135–145)

## 2016-09-05 LAB — CBC
HCT: 31.3 % — ABNORMAL LOW (ref 39.0–52.0)
Hemoglobin: 10 g/dL — ABNORMAL LOW (ref 13.0–17.0)
MCH: 29.2 pg (ref 26.0–34.0)
MCHC: 31.9 g/dL (ref 30.0–36.0)
MCV: 91.5 fL (ref 78.0–100.0)
PLATELETS: 271 10*3/uL (ref 150–400)
RBC: 3.42 MIL/uL — ABNORMAL LOW (ref 4.22–5.81)
RDW: 17.5 % — AB (ref 11.5–15.5)
WBC: 6.3 10*3/uL (ref 4.0–10.5)

## 2016-09-05 NOTE — Progress Notes (Signed)
SPOKE WITH DR Marcell Barlow DO NOT NEED TO REPEAT CHEST XRAY 07-21-16 WITH PRE OP TODAY

## 2016-09-14 MED ORDER — GENTAMICIN SULFATE 40 MG/ML IJ SOLN
1.5000 mg/kg | INTRAVENOUS | Status: AC
  Administered 2016-09-15: 100 mg via INTRAVENOUS
  Filled 2016-09-14 (×2): qty 2.5

## 2016-09-15 ENCOUNTER — Encounter (HOSPITAL_COMMUNITY)
Admission: RE | Disposition: A | Discharge status: Home / Self Care | Payer: Self-pay | Source: Ambulatory Visit | Attending: Urology

## 2016-09-15 ENCOUNTER — Ambulatory Visit (HOSPITAL_COMMUNITY): Payer: Medicare Other | Admitting: Anesthesiology

## 2016-09-15 ENCOUNTER — Encounter (HOSPITAL_COMMUNITY): Payer: Self-pay | Admitting: Certified Registered Nurse Anesthetist

## 2016-09-15 ENCOUNTER — Ambulatory Visit (HOSPITAL_COMMUNITY)
Admission: RE | Admit: 2016-09-15 | Discharge: 2016-09-15 | Disposition: A | Discharge status: Home / Self Care | Payer: Medicare Other | Source: Ambulatory Visit | Attending: Urology | Admitting: Urology

## 2016-09-15 DIAGNOSIS — Z87891 Personal history of nicotine dependence: Secondary | ICD-10-CM | POA: Diagnosis not present

## 2016-09-15 DIAGNOSIS — M1990 Unspecified osteoarthritis, unspecified site: Secondary | ICD-10-CM | POA: Diagnosis not present

## 2016-09-15 DIAGNOSIS — H409 Unspecified glaucoma: Secondary | ICD-10-CM | POA: Insufficient documentation

## 2016-09-15 DIAGNOSIS — I482 Chronic atrial fibrillation: Secondary | ICD-10-CM | POA: Insufficient documentation

## 2016-09-15 DIAGNOSIS — N183 Chronic kidney disease, stage 3 (moderate): Secondary | ICD-10-CM | POA: Diagnosis not present

## 2016-09-15 DIAGNOSIS — C669 Malignant neoplasm of unspecified ureter: Secondary | ICD-10-CM | POA: Diagnosis not present

## 2016-09-15 DIAGNOSIS — C679 Malignant neoplasm of bladder, unspecified: Secondary | ICD-10-CM | POA: Insufficient documentation

## 2016-09-15 DIAGNOSIS — H3530 Unspecified macular degeneration: Secondary | ICD-10-CM | POA: Insufficient documentation

## 2016-09-15 DIAGNOSIS — N400 Enlarged prostate without lower urinary tract symptoms: Secondary | ICD-10-CM | POA: Diagnosis not present

## 2016-09-15 DIAGNOSIS — N289 Disorder of kidney and ureter, unspecified: Secondary | ICD-10-CM | POA: Diagnosis not present

## 2016-09-15 DIAGNOSIS — I509 Heart failure, unspecified: Secondary | ICD-10-CM | POA: Diagnosis not present

## 2016-09-15 DIAGNOSIS — N1330 Unspecified hydronephrosis: Secondary | ICD-10-CM | POA: Diagnosis not present

## 2016-09-15 DIAGNOSIS — E039 Hypothyroidism, unspecified: Secondary | ICD-10-CM | POA: Insufficient documentation

## 2016-09-15 DIAGNOSIS — C651 Malignant neoplasm of right renal pelvis: Secondary | ICD-10-CM | POA: Diagnosis not present

## 2016-09-15 DIAGNOSIS — K219 Gastro-esophageal reflux disease without esophagitis: Secondary | ICD-10-CM | POA: Insufficient documentation

## 2016-09-15 DIAGNOSIS — C7919 Secondary malignant neoplasm of other urinary organs: Secondary | ICD-10-CM | POA: Diagnosis not present

## 2016-09-15 DIAGNOSIS — I10 Essential (primary) hypertension: Secondary | ICD-10-CM | POA: Diagnosis not present

## 2016-09-15 DIAGNOSIS — I130 Hypertensive heart and chronic kidney disease with heart failure and stage 1 through stage 4 chronic kidney disease, or unspecified chronic kidney disease: Secondary | ICD-10-CM | POA: Insufficient documentation

## 2016-09-15 DIAGNOSIS — Z8249 Family history of ischemic heart disease and other diseases of the circulatory system: Secondary | ICD-10-CM | POA: Insufficient documentation

## 2016-09-15 DIAGNOSIS — Z466 Encounter for fitting and adjustment of urinary device: Secondary | ICD-10-CM | POA: Diagnosis not present

## 2016-09-15 HISTORY — PX: CYSTOSCOPY/RETROGRADE/URETEROSCOPY: SHX5316

## 2016-09-15 SURGERY — CYSTOSCOPY/RETROGRADE/URETEROSCOPY
Anesthesia: General | Laterality: Bilateral

## 2016-09-15 MED ORDER — EPHEDRINE SULFATE-NACL 50-0.9 MG/10ML-% IV SOSY
PREFILLED_SYRINGE | INTRAVENOUS | Status: DC | PRN
  Administered 2016-09-15: 10 mg via INTRAVENOUS

## 2016-09-15 MED ORDER — PHENYLEPHRINE 40 MCG/ML (10ML) SYRINGE FOR IV PUSH (FOR BLOOD PRESSURE SUPPORT)
PREFILLED_SYRINGE | INTRAVENOUS | Status: DC | PRN
  Administered 2016-09-15 (×3): 80 ug via INTRAVENOUS

## 2016-09-15 MED ORDER — SODIUM CHLORIDE 0.9 % IR SOLN
Status: DC | PRN
  Administered 2016-09-15: 4000 mL via INTRAVESICAL

## 2016-09-15 MED ORDER — IOHEXOL 300 MG/ML  SOLN
INTRAMUSCULAR | Status: DC | PRN
  Administered 2016-09-15: 34 mL via URETHRAL

## 2016-09-15 MED ORDER — LIDOCAINE 2% (20 MG/ML) 5 ML SYRINGE
INTRAMUSCULAR | Status: DC | PRN
  Administered 2016-09-15: 60 mg via INTRAVENOUS

## 2016-09-15 MED ORDER — LACTATED RINGERS IV SOLN
INTRAVENOUS | Status: DC
  Administered 2016-09-15 (×2): via INTRAVENOUS

## 2016-09-15 MED ORDER — FENTANYL CITRATE (PF) 100 MCG/2ML IJ SOLN
INTRAMUSCULAR | Status: DC | PRN
  Administered 2016-09-15: 50 ug via INTRAVENOUS

## 2016-09-15 MED ORDER — PROPOFOL 10 MG/ML IV BOLUS
INTRAVENOUS | Status: DC | PRN
  Administered 2016-09-15: 100 mg via INTRAVENOUS

## 2016-09-15 MED ORDER — FENTANYL CITRATE (PF) 100 MCG/2ML IJ SOLN
INTRAMUSCULAR | Status: AC
  Filled 2016-09-15: qty 2

## 2016-09-15 MED ORDER — PROMETHAZINE HCL 25 MG/ML IJ SOLN: 6.2500 mg | INTRAMUSCULAR | Status: DC | PRN

## 2016-09-15 MED ORDER — EPHEDRINE 5 MG/ML INJ
INTRAVENOUS | Status: AC
  Filled 2016-09-15: qty 10

## 2016-09-15 MED ORDER — FENTANYL CITRATE (PF) 100 MCG/2ML IJ SOLN: 25.0000 ug | INTRAMUSCULAR | Status: DC | PRN

## 2016-09-15 MED ORDER — ONDANSETRON HCL 4 MG/2ML IJ SOLN
INTRAMUSCULAR | Status: DC | PRN
  Administered 2016-09-15: 4 mg via INTRAVENOUS

## 2016-09-15 MED ORDER — PROPOFOL 10 MG/ML IV BOLUS
INTRAVENOUS | Status: AC
  Filled 2016-09-15: qty 20

## 2016-09-15 SURGICAL SUPPLY — 23 items
BAG URO CATCHER STRL LF (MISCELLANEOUS) ×2 IMPLANT
BASKET LASER NITINOL 1.9FR (BASKET) ×2 IMPLANT
CATH INTERMIT  6FR 70CM (CATHETERS) ×2 IMPLANT
CLOTH BEACON ORANGE TIMEOUT ST (SAFETY) ×2 IMPLANT
FIBER LASER FLEXIVA 1000 (UROLOGICAL SUPPLIES) IMPLANT
FIBER LASER FLEXIVA 365 (UROLOGICAL SUPPLIES) IMPLANT
FIBER LASER FLEXIVA 550 (UROLOGICAL SUPPLIES) IMPLANT
FIBER LASER TRAC TIP (UROLOGICAL SUPPLIES) IMPLANT
GLOVE BIOGEL M STRL SZ7.5 (GLOVE) ×2 IMPLANT
GOWN STRL REUS W/TWL LRG LVL3 (GOWN DISPOSABLE) ×4 IMPLANT
GUIDEWIRE ANG ZIPWIRE 038X150 (WIRE) ×4 IMPLANT
GUIDEWIRE STR DUAL SENSOR (WIRE) ×2 IMPLANT
IV NS 1000ML (IV SOLUTION) ×1
IV NS 1000ML BAXH (IV SOLUTION) ×1 IMPLANT
MANIFOLD NEPTUNE II (INSTRUMENTS) ×2 IMPLANT
PACK CYSTO (CUSTOM PROCEDURE TRAY) ×2 IMPLANT
SHEATH ACCESS URETERAL 24CM (SHEATH) IMPLANT
SHEATH ACCESS URETERAL 38CM (SHEATH) IMPLANT
SHEATH ACCESS URETERAL 54CM (SHEATH) IMPLANT
STENT URET 6FRX26 CONTOUR (STENTS) ×4 IMPLANT
SYR CONTROL 10ML LL (SYRINGE) IMPLANT
TUBE FEEDING 8FR 16IN STR KANG (MISCELLANEOUS) ×2 IMPLANT
TUBING CONNECTING 10 (TUBING) ×2 IMPLANT

## 2016-09-15 NOTE — Brief Op Note (Signed)
09/15/2016  4:29 PM  PATIENT:  Austin Jackson  83 y.o. male  PRE-OPERATIVE DIAGNOSIS:  URETERAL AND BLADDER CANCER  POST-OPERATIVE DIAGNOSIS:  URETERAL AND BLADDER CANCER  PROCEDURE:  Procedure(s): CYSTOSCOPY/ RETROGRADE/DIAGNOSITC URETEROSCOPY/URETERAL STENT EXCHANGE (Bilateral)  SURGEON:  Surgeon(s) and Role:    * [name], MD - Primary  PHYSICIAN ASSISTANT:   ASSISTANTS: none   ANESTHESIA:   general  EBL:  Total I/O In: 800 [I.V.:800] Out: -   BLOOD ADMINISTERED:none  DRAINS: none   LOCAL MEDICATIONS USED:  NONE  SPECIMEN:  No Specimen  DISPOSITION OF SPECIMEN:  N/A  COUNTS:  YES  TOURNIQUET:  * No tourniquets in log *  DICTATION: .Other Dictation: Dictation Number [id number]  PLAN OF CARE: Discharge to home after PACU  PATIENT DISPOSITION:  PACU - hemodynamically stable.   Delay start of Pharmacological VTE agent (>24hrs) due to surgical blood loss or risk of bleeding: yes

## 2016-09-15 NOTE — Anesthesia Procedure Notes (Signed)
Procedure Name: LMA Insertion Performed by: Elisabet Gutzmer J Pre-anesthesia Checklist: Patient identified, Emergency Drugs available, Suction available, Patient being monitored and Timeout performed Patient Re-evaluated:Patient Re-evaluated prior to inductionOxygen Delivery Method: Circle system utilized Preoxygenation: Pre-oxygenation with 100% oxygen Intubation Type: IV induction Ventilation: Mask ventilation without difficulty LMA: LMA inserted LMA Size: 4.0 Number of attempts: 1 Placement Confirmation: positive ETCO2,  CO2 detector and breath sounds checked- equal and bilateral Tube secured with: Tape Dental Injury: Teeth and Oropharynx as per pre-operative assessment        

## 2016-09-15 NOTE — Discharge Instructions (Addendum)
1 - You may have urinary urgency (bladder spasms) and bloody urine on / off with stent in place. This is normal.  2 - Call MD or go to ER for fever >102, severe pain / nausea / vomiting not relieved by medications, or acute change in medical status   General Anesthesia, Adult, Care After These instructions provide you with information about caring for yourself after your procedure. Your health care provider may also give you more specific instructions. Your treatment has been planned according to current medical practices, but problems sometimes occur. Call your health care provider if you have any problems or questions after your procedure. What can I expect after the procedure? After the procedure, it is common to have:  Vomiting.  A sore throat.  Mental slowness. It is common to feel:  Nauseous.  Cold or shivery.  Sleepy.  Tired.  Sore or achy, even in parts of your body where you did not have surgery. Follow these instructions at home: For at least 24 hours after the procedure:   Do not:  Participate in activities where you could fall or become injured.  Drive.  Use heavy machinery.  Drink alcohol.  Take sleeping pills or medicines that cause drowsiness.  Make important decisions or sign legal documents.  Take care of children on your own.  Rest. Eating and drinking   If you vomit, drink water, juice, or soup when you can drink without vomiting.  Drink enough fluid to keep your urine clear or pale yellow.  Make sure you have little or no nausea before eating solid foods.  Follow the diet recommended by your health care provider. General instructions   Have a responsible adult stay with you until you are awake and alert.  Return to your normal activities as told by your health care provider. Ask your health care provider what activities are safe for you.  Take over-the-counter and prescription medicines only as told by your health care provider.  If  you smoke, do not smoke without supervision.  Keep all follow-up visits as told by your health care provider. This is important. Contact a health care provider if:  You continue to have nausea or vomiting at home, and medicines are not helpful.  You cannot drink fluids or start eating again.  You cannot urinate after 8-12 hours.  You develop a skin rash.  You have fever.  You have increasing redness at the site of your procedure. Get help right away if:  You have difficulty breathing.  You have chest pain.  You have unexpected bleeding.  You feel that you are having a life-threatening or urgent problem. This information is not intended to replace advice given to you by your health care provider. Make sure you discuss any questions you have with your health care provider. Document Released: 10/02/2000 Document Revised: 11/29/2015 Document Reviewed: 06/10/2015 Elsevier Interactive Patient Education  2017 [employer].

## 2016-09-15 NOTE — Anesthesia Preprocedure Evaluation (Addendum)
Anesthesia Evaluation  Patient identified by MRN, date of birth, ID band Patient awake    Reviewed: Allergy & Precautions, NPO status , Patient's Chart, lab work & pertinent test results  Airway Mallampati: I  TM Distance: >3 FB Neck ROM: Limited    Dental no notable dental hx.    Pulmonary neg pulmonary ROS, former smoker,    Pulmonary exam normal breath sounds clear to auscultation       Cardiovascular hypertension, Normal cardiovascular exam+ dysrhythmias Atrial Fibrillation  Rhythm:Regular Rate:Normal  Not anticoagulated for afib    Neuro/Psych negative neurological ROS  negative psych ROS   GI/Hepatic negative GI ROS, Neg liver ROS,   Endo/Other  Hypothyroidism   Renal/GU Renal InsufficiencyRenal disease  negative genitourinary   Musculoskeletal negative musculoskeletal ROS (+)   Abdominal   Peds negative pediatric ROS (+)  Hematology negative hematology ROS (+) anemia ,   Anesthesia Other Findings   Reproductive/Obstetrics negative OB ROS                            Anesthesia Physical Anesthesia Plan  ASA: III  Anesthesia Plan: General   Post-op Pain Management:    Induction: Intravenous  Airway Management Planned: LMA  Additional Equipment:   Intra-op Plan:   Post-operative Plan: Extubation in OR  Informed Consent: I have reviewed the patients History and Physical, chart, labs and discussed the procedure including the risks, benefits and alternatives for the proposed anesthesia with the patient or authorized representative who has indicated his/her understanding and acceptance.   Dental advisory given  Plan Discussed with: CRNA and Surgeon  Anesthesia Plan Comments:         Anesthesia Quick Evaluation

## 2016-09-15 NOTE — H&P (Signed)
Austin Jackson is an 83 y.o. male.    Chief Complaint: Pre-op Bilateral stent change and LEFT diagnostic ureteroscopy  HPI:   1 - Right Renal Pelvis / Ureteral Cancer in Atrophic Right Kidney - large right renal pelvis mass with large hydro on [place] and CT x several 2017 on eval gross hematuria. 1 artery / 1 vein right renovascular anatomy. No associated large adenopathy. BX-proven high-grade urothelial carcinoma 07/2016.   2 - Bladder Mass / Gross Hematuria - pt with Rt wall bladder mass on CT late 2017. BX-proven TaG3 bladder Cancer. No pelvic adenopathy.   3 -  Acute Renal Insufficiency / New LEFT Hydronephrosis - s/p BILATERAL ureteral stents 07/2016 for left hydro (dominant kidney). DDX UVJ obstruction from edema from prior TURBT v. new left side intraluminal lesion.   PMH sig for AFib (not anticaogulated), mild CHF/lasix (still able to walk [facility]n [facility]), gloucoma. Bilateral inguinal hernia repair. His son Austin Jackson is ER MD in [employer] and available at [phone]. His PCP is Dr. Eula Fried in [place].   Today " Austin Jackson " is seen to proceed with cysto, bilateral retrogrades, bilateral stent replacent, and diagnostic LEFT ureteroscopy to r/o intraluminal left ureteral lesions. No interval fevers.     Past Medical History:  Diagnosis Date  . Bilateral lower extremity edema   . Bladder cancer ([place])   . CHF (congestive heart failure) ([facility])    mild  . Chronic atrial fibrillation ([place])    dx 2002--  s/p cardioversion approx 2006 or 2007-- per pt last cardiologist visit at that time-- currently be followed by pcp dr Theodis Blaze in [place]  . CKD (chronic kidney disease), stage III   . Dysrhythmia   . Full dentures   . GERD (gastroesophageal reflux disease)   . Glaucoma, both eyes   . Gross hematuria    intermittent  . History of BPH   . History of GI bleed    2013--  per pt unable to find reason but had been on blood thinner,  was transfusion's  . History of kidney stones   .  Hypertension   . Hypothyroidism due to amiodarone   . Iron deficiency anemia   . Macular degeneration of both eyes   . OA (osteoarthritis)   . PONV (postoperative nausea and vomiting)   . Renal mass, right    renal pelvis    Past Surgical History:  Procedure Laterality Date  . CARDIOVERSION  2002  . COLONOSCOPY  yrs ago  . CYSTOSCOPY WITH RETROGRADE PYELOGRAM, URETEROSCOPY AND STENT PLACEMENT Left 07/19/2016   Procedure: CYSTOSCOPY WITH RETROGRADE PYELOGRAM,  AND STENT PLACEMENT;  Surgeon: [name], MD;  Location: [facility];  Service: Urology;  Laterality: Left;  . EXTRACORPOREAL SHOCK WAVE LITHOTRIPSY  1970's  . EYE SURGERY Left    for glaucoma  . EYE SURGERY Bilateral    cataract extraction with IOL  . INGUINAL HERNIA REPAIR Bilateral 2007 and 1980's  . TRANSURETHRAL RESECTION OF BLADDER TUMOR Right 07/12/2016   Procedure: TRANSURETHRAL RESECTION OF BLADDER TUMOR (TURBT)/ BILATERAL RETROGRADE PYLEOGRAM/ RIGHT DIAGNOSTIC URETEROSCOPY;  Surgeon: [name], MD;  Location: [facility];  Service: Urology;  Laterality: Right;  . TRANSURETHRAL RESECTION OF PROSTATE  2002    Family History  Problem Relation Age of Onset  . Kidney cancer Neg Hx   . Diabetes Mellitus II Neg Hx   . CAD Neg Hx    Social History:  reports that he quit smoking about 32 years ago.  His smoking use included Cigarettes. He has a 60.00 pack-year smoking history. He has never used smokeless tobacco. He reports that he drinks alcohol. He reports that he does not use drugs.  Allergies:  Allergies  Allergen Reactions  . Codeine Nausea And Vomiting  . Ultram [Tramadol Hcl] Other (See Comments)    Confusion     No prescriptions prior to admission.    No results found for this or any previous visit (from the past 48 hour(s)). No results found.  Review of Systems  Constitutional: Negative.  Negative for chills and fever.  HENT: Negative.   Eyes: Negative.   Respiratory: Negative.    Cardiovascular: Negative.   Gastrointestinal: Negative.   Genitourinary: Negative.   Musculoskeletal: Negative.   Skin: Negative.   Neurological: Negative.   Endo/Heme/Allergies: Negative.   Psychiatric/Behavioral: Negative.     There were no vitals taken for this visit. Physical Exam  Constitutional: He is oriented to person, place, and time. He appears well-developed.  Vigorous for age.   HENT:  Head: Normocephalic.  Eyes: Pupils are equal, round, and reactive to light.  Neck: Normal range of motion.  Cardiovascular: Normal rate.   Respiratory: Effort normal.  GI: Soft.  Genitourinary:  Genitourinary Comments: No CVAT  Musculoskeletal: Normal range of motion.  Neurological: He is alert and oriented to person, place, and time.  Skin: Skin is warm.  Psychiatric: He has a normal mood and affect.     Assessment/Plan  Difficult Situation with multifocal but non-metastatic urothelial carcinoma in very old man who has good baseline functional status.   We had previoulsy considered right neph-U and local therapy for bladder as he was having symtomatic anemia from upper tract bleeding, but this has improved with stent in place. Also new left hydro is concerning and I would STRONGLY want to r/o left sided ureteral neoplasm before any Rt sided renal surgery.   Proceed as planned OR cysto, bilat RPG's, left DX ureterosscopy, possible TURBT today. This will serve to help verify no rapidly recurrent bladder tumor (surveillance) and further characterize left ureter. They wish to proceed. Risks, benefits, alternatives, expected peri-op course discussed previously and reiterated today.      [name], MD 09/15/2016, 5:50 AM

## 2016-09-15 NOTE — Transfer of Care (Signed)
Immediate Anesthesia Transfer of Care Note  Patient: Austin Jackson  Procedure(s) Performed: Procedure(s): CYSTOSCOPY/ RETROGRADE/DIAGNOSITC URETEROSCOPY/URETERAL STENT EXCHANGE (Bilateral)  Patient Location: PACU  Anesthesia Type:General  Level of Consciousness: sedated, patient cooperative and responds to stimulation  Airway & Oxygen Therapy: Patient Spontanous Breathing and Patient connected to face mask oxygen  Post-op Assessment: Report given to RN and Post -op Vital signs reviewed and stable  Post vital signs: Reviewed and stable  Last Vitals:  Vitals:   09/15/16 1250  BP: 135/75  Pulse: 95  Resp: 18  Temp: 36.4 C    Last Pain:  Vitals:   09/15/16 1250  TempSrc: Oral      Patients Stated Pain Goal: 5 (25/67/20 9198)  Complications: No apparent anesthesia complications

## 2016-09-15 NOTE — Anesthesia Postprocedure Evaluation (Addendum)
Anesthesia Post Note  Patient: Austin Jackson  Procedure(s) Performed: Procedure(s) (LRB): CYSTOSCOPY/ RETROGRADE/DIAGNOSITC URETEROSCOPY/URETERAL STENT EXCHANGE (Bilateral)  Patient location during evaluation: PACU Anesthesia Type: General Level of consciousness: awake and alert Pain management: pain level controlled Vital Signs Assessment: post-procedure vital signs reviewed and stable Respiratory status: spontaneous breathing, nonlabored ventilation, respiratory function stable and patient connected to nasal cannula oxygen Cardiovascular status: blood pressure returned to baseline and stable Postop Assessment: no signs of nausea or vomiting Anesthetic complications: no       Last Vitals:  Vitals:   09/15/16 1712 09/15/16 1715  BP:  139/76  Pulse:  83  Resp:  20  Temp: 36.5 C     Last Pain:  Vitals:   09/15/16 1250  TempSrc: Oral                 [name] S

## 2016-09-18 ENCOUNTER — Encounter (HOSPITAL_COMMUNITY): Payer: Self-pay | Admitting: Urology

## 2016-09-18 NOTE — Op Note (Signed)
NAMECAN, LUCCI NO.:  [id number]  MEDICAL RECORD NO.:  [id number]  LOCATION:                                 FACILITY:  PHYSICIAN:  [name], MD     DATE OF BIRTH:  Apr 03, 1920  DATE OF PROCEDURE: 09/15/2016                               OPERATIVE REPORT   DIAGNOSES:  Right high-grade upper tract ureteral cancer, history of left hydronephrosis with acute renal failure, history of bladder cancer.  PROCEDURES: 1. Cystoscopy with bilateral retrograde pyelograms with     interpretation. 2. Exchange of bilateral ureteral stents, 6 x 26 Contour, no tether. 3. Bilateral diagnostic ureteroscopy.  ESTIMATED BLOOD LOSS:  Nil.  COMPLICATION:  None.  SPECIMEN:  None.  FINDINGS: 1. No evidence of recurrence of bladder cancer. 2. Extreme tortuosity of left ureter with rotational anomaly of left     kidney, area of likely scarring and edema at the left ureteral     orifice, no papillary tumor. 3. Significant tortuosity, hydronephrosis of the right ureter and     kidney. 4. Successful replacement of bilateral ureteral stents, proximal end     in the renal pelvis and distal end in the bladder.  INDICATIONS:  Austin Jackson is a very pleasant 83 year old gentleman with recent history of right upper tract and bladder urothelial carcinoma and found on workup of gross hematuria.  He underwent initial management of this in January 2018, as a recurrent refractory gross hematuria.  He has done quite well with resection of his bladder tumor and stenting on the right.  He unfortunately developed acute renal failure with evidence of new hydronephrosis on the left and it was felt that he underwent temporizing measure with left ureteral stenting several months ago.  He now presents for repeat endoscopic exam, bilateral stent change with goal of verifying no recurrent tumor in the bladder or left kidney. Informed consent was obtained and placed in the medical  record.  PROCEDURE IN DETAIL:  The patient being Austin Jackson was verified. Procedure being cysto, bilateral stent change, left diagnostic ureteroscopy was confirmed.  Procedure was carried out.  Time-out was performed.  Intravenous antibiotics were administered.  General anesthesia was introduced.  The patient was placed into a low lithotomy position and sterile field was created by prepping and draping the patient's penis, perineum and proximal thighs using iodine.  Next, cystourethroscopy was performed using a rigid cystoscope with offset lens.  Inspection of the anterior and posterior urethra revealed only a wide-open prostatic channel consistent with prior transurethral resection of the prostate.  Inspection of the urinary bladder revealed old resection site without recurrence of obvious bladder tumor.  There was no obvious new tumor.  Distal end of the left ureteral stent was seen in situ.  The right ureteral stent had proximally migrated and was coiled in the distal ureter.  The distal end of the left stent was grasped, brought to level of the urethral meatus, through which, a 0.038 Zip wire was then advanced to the level of the upper pole, set aside as a working wire; over which, an open-ended catheter was placed and left retrograde pyelogram was obtained.  Left retrograde pyelogram demonstrated  a single left ureter with single- system left kidney.  There were significant tortuosity and hydronephrosis with minimal ureteronephrosis.  There was rotational anomaly with lateral insertion of the UPJ.  There was also significant narrowing in the area of the ureterovesical junction.  The Zip wire was once again advanced, set aside as a safety wire.  An 8-French feeding tube was placed in the urinary bladder for pressure release and semi- rigid ureteroscopy was performed to the entire length of left ureter alongside a separate Sensor working wire.  There was only  significant tortuosity of the ureter noted.  There were no papular lesions were seen.  There was significant intraluminal edema and scarring in the area of the UVJ at the site of prior bladder tumor, no obvious intraluminal lesions noted.  It was felt that he did have persistent pending obstruction on the left due to combination of abnormal UPJ anatomy as well as UVJ scarring and the interval stenting would clearly be warranted again.  As such, a new 6 x 26 Contour-type stent was placed over the zip wire on the left using fluoroscopic and cystoscopic guidance.  Good proximal and distal deployment were noted.  Attention was then directed at the right side.  The right distal ureter was cannulated with a 6-French end-hole catheter and right retrograde pyelogram was obtained.  Right retrograde pyelogram demonstrated a single right ureter with single-system right kidney.  There was massive hydronephrosis without ureteronephrosis.  There was tortuosity noted.  The stent was visualized again having proximally migrated.  The semi-rigid scope was then used to perform ureteroscopy of the right distal ureter alongside a separate Sensor working wire.  The distal end of the stent was seen, it was grasped with an Escape basket and a snaring technique and removed in its entirety, and using cystoscopic and fluoroscopic guidance, a new right ureteral stent, 6 x 26 Contour-type stent was placed, and good proximal and distal deployment were noted.  We achieved the goals of procedure today.  The bladder was partially emptied per cystoscope.  Procedure was then terminated.  The patient tolerated the procedure well, there were no immediate periprocedural complications.  The patient was taken to the postanesthesia care unit in stable condition.    ______________________________ [name], MD   ______________________________ [name], MD    TM/MEDQ  D:  09/15/2016  T:  09/15/2016  Job:   [id number]

## 2016-09-19 ENCOUNTER — Other Ambulatory Visit: Payer: Self-pay | Admitting: Urology

## 2016-09-28 DIAGNOSIS — C651 Malignant neoplasm of right renal pelvis: Secondary | ICD-10-CM | POA: Diagnosis not present

## 2016-09-28 DIAGNOSIS — C672 Malignant neoplasm of lateral wall of bladder: Secondary | ICD-10-CM | POA: Diagnosis not present

## 2016-10-03 NOTE — Patient Instructions (Signed)
Austin Jackson  10/03/2016   Your procedure is scheduled on: 10-12-16   Report to [facility] Main  Entrance take [facility]  elevators to 3rd floor to  [place] at [phone].  Call this number if you have problems the morning of surgery [phone]   Remember: ONLY 1 PERSON MAY GO WITH YOU TO SHORT STAY TO GET  READY MORNING OF [name].  Do not eat food After Midnight Tuesday 10-10-16. Drink plenty of clear liquids all day Wednesday 10-11-16 and follow all of Dr. Lucy Antigua bowel prep instructions. Nothing by mouth after midnight Wednesday !!     Take these medicines the morning of surgery with A SIP OF WATER: metoprolol(lopressor), tylenol as needed, eye drops                                 You may not have any metal on your body including hair pins and              piercings  Do not wear jewelry, make-up, lotions, powders or perfumes, deodorant             Do not wear nail polish.  Do not shave  48 hours prior to surgery.              Men may shave face and neck.   Do not bring valuables to the hospital. [name].  Contacts, dentures or bridgework may not be worn into surgery.  Leave suitcase in the car. After surgery it may be brought to your room.              Please read over the following fact sheets you were given: _____________________________________________________________________                CLEAR LIQUID DIET   Foods Allowed                                                                     Foods Excluded  Coffee and tea, regular and decaf                             liquids that you cannot  Plain Jell-O in any flavor                                             see through such as: Fruit ices (not with fruit pulp)                                     milk, soups, orange juice  Iced Popsicles  All solid food Carbonated beverages, regular and diet                                     Cranberry, grape and apple juices Sports drinks like Gatorade Lightly seasoned clear broth or consume(fat free) Sugar, honey syrup  Sample Menu Breakfast                                Lunch                                     Supper Cranberry juice                    Beef broth                            Chicken broth Jell-O                                     Grape juice                           Apple juice Coffee or tea                        Jell-O                                      Popsicle                                                Coffee or tea                        Coffee or tea  _____________________________________________________________________  [facility] - Preparing for Surgery Before surgery, you can play an important role.  Because skin is not sterile, your skin needs to be as free of germs as possible.  You can reduce the number of germs on your skin by washing with CHG (chlorahexidine gluconate) soap before surgery.  CHG is an antiseptic cleaner which kills germs and bonds with the skin to continue killing germs even after washing. Please DO NOT use if you have an allergy to CHG or antibacterial soaps.  If your skin becomes reddened/irritated stop using the CHG and inform your nurse when you arrive at Short Stay. Do not shave (including legs and underarms) for at least 48 hours prior to the first CHG shower.  You may shave your face/neck. Please follow these instructions carefully:  1.  Shower with CHG Soap the night before surgery and the  morning of Surgery.  2.  If you choose to wash your hair, wash your hair first as usual with your  normal  shampoo.  3.  After you shampoo, rinse your hair and body thoroughly to remove the  shampoo.  4.  Use CHG as you would any other liquid soap.  You can apply chg directly  to the skin and wash                       Gently with a scrungie or clean washcloth.  5.  Apply the CHG  Soap to your body ONLY FROM THE NECK DOWN.   Do not use on face/ open                           Wound or open sores. Avoid contact with eyes, ears mouth and genitals (private parts).                       Wash face,  Genitals (private parts) with your normal soap.             6.  Wash thoroughly, paying special attention to the area where your surgery  will be performed.  7.  Thoroughly rinse your body with warm water from the neck down.  8.  DO NOT shower/wash with your normal soap after using and rinsing off  the CHG Soap.                9.  Pat yourself dry with a clean towel.            10.  Wear clean pajamas.            11.  Place clean sheets on your bed the night of your first shower and do not  sleep with pets. Day of Surgery : Do not apply any lotions/deodorants the morning of surgery.  Please wear clean clothes to the hospital/surgery center.  FAILURE TO FOLLOW THESE INSTRUCTIONS MAY RESULT IN THE CANCELLATION OF YOUR SURGERY PATIENT SIGNATURE_________________________________  NURSE SIGNATURE__________________________________  ________________________________________________________________________

## 2016-10-03 NOTE — Progress Notes (Signed)
cxr 07-21-16 EPIC EKG 07-19-16 EPIC

## 2016-10-05 ENCOUNTER — Encounter (HOSPITAL_COMMUNITY): Payer: Self-pay

## 2016-10-05 ENCOUNTER — Encounter (HOSPITAL_COMMUNITY)
Admission: RE | Admit: 2016-10-05 | Discharge: 2016-10-05 | Disposition: A | Discharge status: Home / Self Care | Payer: Medicare Other | Source: Ambulatory Visit | Attending: Urology | Admitting: Urology

## 2016-10-05 DIAGNOSIS — C641 Malignant neoplasm of right kidney, except renal pelvis: Secondary | ICD-10-CM | POA: Diagnosis not present

## 2016-10-05 DIAGNOSIS — Z0183 Encounter for blood typing: Secondary | ICD-10-CM | POA: Diagnosis not present

## 2016-10-05 DIAGNOSIS — Z01812 Encounter for preprocedural laboratory examination: Secondary | ICD-10-CM | POA: Diagnosis not present

## 2016-10-05 LAB — COMPREHENSIVE METABOLIC PANEL
ALT: 9 U/L — AB (ref 17–63)
AST: 20 U/L (ref 15–41)
Albumin: 3 g/dL — ABNORMAL LOW (ref 3.5–5.0)
Alkaline Phosphatase: 48 U/L (ref 38–126)
Anion gap: 7 (ref 5–15)
BILIRUBIN TOTAL: 0.7 mg/dL (ref 0.3–1.2)
BUN: 45 mg/dL — ABNORMAL HIGH (ref 6–20)
CHLORIDE: 106 mmol/L (ref 101–111)
CO2: 23 mmol/L (ref 22–32)
CREATININE: 1.63 mg/dL — AB (ref 0.61–1.24)
Calcium: 9 mg/dL (ref 8.9–10.3)
GFR, EST AFRICAN AMERICAN: 40 mL/min — AB (ref >60)
GFR, EST NON AFRICAN AMERICAN: 34 mL/min — AB (ref >60)
Glucose, Bld: 125 mg/dL — ABNORMAL HIGH (ref 65–99)
POTASSIUM: 4.5 mmol/L (ref 3.5–5.1)
Sodium: 136 mmol/L (ref 135–145)
TOTAL PROTEIN: 8.1 g/dL (ref 6.5–8.1)

## 2016-10-05 LAB — CBC
HCT: 30.2 % — ABNORMAL LOW (ref 39.0–52.0)
Hemoglobin: 9.6 g/dL — ABNORMAL LOW (ref 13.0–17.0)
MCH: 29.3 pg (ref 26.0–34.0)
MCHC: 31.8 g/dL (ref 30.0–36.0)
MCV: 92.1 fL (ref 78.0–100.0)
PLATELETS: 216 10*3/uL (ref 150–400)
RBC: 3.28 MIL/uL — ABNORMAL LOW (ref 4.22–5.81)
RDW: 18.1 % — AB (ref 11.5–15.5)
WBC: 5 10*3/uL (ref 4.0–10.5)

## 2016-10-05 NOTE — Progress Notes (Signed)
CBC, CMP results routed to Dr Tresa Moore via epic

## 2016-10-12 ENCOUNTER — Encounter (HOSPITAL_COMMUNITY): Payer: Self-pay | Admitting: *Deleted

## 2016-10-12 ENCOUNTER — Inpatient Hospital Stay (HOSPITAL_COMMUNITY)
Admission: RE | Admit: 2016-10-12 | Discharge: 2016-10-14 | DRG: 657 | Disposition: A | Discharge status: Home / Self Care | Payer: Medicare Other | Source: Ambulatory Visit | Attending: Urology | Admitting: Urology

## 2016-10-12 ENCOUNTER — Encounter (HOSPITAL_COMMUNITY)
Admission: RE | Disposition: A | Discharge status: Home / Self Care | Payer: Self-pay | Source: Ambulatory Visit | Attending: Urology

## 2016-10-12 ENCOUNTER — Inpatient Hospital Stay (HOSPITAL_COMMUNITY): Payer: Medicare Other | Admitting: Anesthesiology

## 2016-10-12 DIAGNOSIS — Z87891 Personal history of nicotine dependence: Secondary | ICD-10-CM | POA: Diagnosis not present

## 2016-10-12 DIAGNOSIS — C641 Malignant neoplasm of right kidney, except renal pelvis: Secondary | ICD-10-CM | POA: Diagnosis present

## 2016-10-12 DIAGNOSIS — R310 Gross hematuria: Secondary | ICD-10-CM | POA: Diagnosis not present

## 2016-10-12 DIAGNOSIS — Z961 Presence of intraocular lens: Secondary | ICD-10-CM | POA: Diagnosis present

## 2016-10-12 DIAGNOSIS — Z9841 Cataract extraction status, right eye: Secondary | ICD-10-CM

## 2016-10-12 DIAGNOSIS — E039 Hypothyroidism, unspecified: Secondary | ICD-10-CM | POA: Diagnosis present

## 2016-10-12 DIAGNOSIS — R3915 Urgency of urination: Secondary | ICD-10-CM | POA: Diagnosis present

## 2016-10-12 DIAGNOSIS — Z9842 Cataract extraction status, left eye: Secondary | ICD-10-CM

## 2016-10-12 DIAGNOSIS — N183 Chronic kidney disease, stage 3 (moderate): Secondary | ICD-10-CM | POA: Diagnosis present

## 2016-10-12 DIAGNOSIS — M1990 Unspecified osteoarthritis, unspecified site: Secondary | ICD-10-CM | POA: Diagnosis present

## 2016-10-12 DIAGNOSIS — H409 Unspecified glaucoma: Secondary | ICD-10-CM | POA: Diagnosis present

## 2016-10-12 DIAGNOSIS — I10 Essential (primary) hypertension: Secondary | ICD-10-CM | POA: Diagnosis not present

## 2016-10-12 DIAGNOSIS — Z79899 Other long term (current) drug therapy: Secondary | ICD-10-CM

## 2016-10-12 DIAGNOSIS — K219 Gastro-esophageal reflux disease without esophagitis: Secondary | ICD-10-CM | POA: Diagnosis present

## 2016-10-12 DIAGNOSIS — C651 Malignant neoplasm of right renal pelvis: Secondary | ICD-10-CM | POA: Diagnosis present

## 2016-10-12 DIAGNOSIS — I482 Chronic atrial fibrillation: Secondary | ICD-10-CM | POA: Diagnosis present

## 2016-10-12 DIAGNOSIS — N401 Enlarged prostate with lower urinary tract symptoms: Secondary | ICD-10-CM | POA: Diagnosis present

## 2016-10-12 DIAGNOSIS — I509 Heart failure, unspecified: Secondary | ICD-10-CM | POA: Diagnosis present

## 2016-10-12 DIAGNOSIS — J449 Chronic obstructive pulmonary disease, unspecified: Secondary | ICD-10-CM | POA: Diagnosis not present

## 2016-10-12 DIAGNOSIS — I4891 Unspecified atrial fibrillation: Secondary | ICD-10-CM | POA: Diagnosis not present

## 2016-10-12 DIAGNOSIS — H3530 Unspecified macular degeneration: Secondary | ICD-10-CM | POA: Diagnosis present

## 2016-10-12 DIAGNOSIS — D500 Iron deficiency anemia secondary to blood loss (chronic): Secondary | ICD-10-CM | POA: Diagnosis present

## 2016-10-12 DIAGNOSIS — I130 Hypertensive heart and chronic kidney disease with heart failure and stage 1 through stage 4 chronic kidney disease, or unspecified chronic kidney disease: Secondary | ICD-10-CM | POA: Diagnosis present

## 2016-10-12 DIAGNOSIS — C661 Malignant neoplasm of right ureter: Secondary | ICD-10-CM | POA: Diagnosis present

## 2016-10-12 HISTORY — PX: ROBOT ASSITED LAPAROSCOPIC NEPHROURETERECTOMY: SHX6077

## 2016-10-12 LAB — HEMOGLOBIN AND HEMATOCRIT, BLOOD
HCT: 32.9 % — ABNORMAL LOW (ref 39.0–52.0)
HEMOGLOBIN: 10.4 g/dL — AB (ref 13.0–17.0)

## 2016-10-12 LAB — PREPARE RBC (CROSSMATCH)

## 2016-10-12 SURGERY — NEPHROURETERECTOMY, ROBOT-ASSISTED, LAPAROSCOPIC
Anesthesia: General | Laterality: Right

## 2016-10-12 MED ORDER — SODIUM CHLORIDE 0.9% FLUSH: 3.0000 mL | INTRAVENOUS | Status: DC | PRN

## 2016-10-12 MED ORDER — LACTATED RINGERS IV SOLN
INTRAVENOUS | Status: DC | PRN
  Administered 2016-10-12 (×2): via INTRAVENOUS

## 2016-10-12 MED ORDER — SODIUM CHLORIDE 0.9 % IV SOLN: Freq: Once | INTRAVENOUS | Status: DC

## 2016-10-12 MED ORDER — ROCURONIUM BROMIDE 50 MG/5ML IV SOSY
PREFILLED_SYRINGE | INTRAVENOUS | Status: AC
  Filled 2016-10-12: qty 5

## 2016-10-12 MED ORDER — LATANOPROST 0.005 % OP SOLN
1.0000 [drp] | Freq: Every day | OPHTHALMIC | Status: DC
  Administered 2016-10-12 – 2016-10-13 (×2): 1 [drp] via OPHTHALMIC
  Filled 2016-10-12: qty 2.5

## 2016-10-12 MED ORDER — FENTANYL CITRATE (PF) 100 MCG/2ML IJ SOLN
INTRAMUSCULAR | Status: AC
  Filled 2016-10-12: qty 2

## 2016-10-12 MED ORDER — DIPHENHYDRAMINE HCL 50 MG/ML IJ SOLN: 12.5000 mg | Freq: Four times a day (QID) | INTRAMUSCULAR | Status: DC | PRN

## 2016-10-12 MED ORDER — ALBUMIN HUMAN 5 % IV SOLN
INTRAVENOUS | Status: AC
  Filled 2016-10-12: qty 250

## 2016-10-12 MED ORDER — DOCUSATE SODIUM 100 MG PO CAPS
100.0000 mg | ORAL_CAPSULE | Freq: Two times a day (BID) | ORAL | Status: DC
  Administered 2016-10-12 – 2016-10-14 (×4): 100 mg via ORAL
  Filled 2016-10-12 (×4): qty 1

## 2016-10-12 MED ORDER — PHENYLEPHRINE HCL 10 MG/ML IJ SOLN
INTRAMUSCULAR | Status: AC
  Filled 2016-10-12: qty 1

## 2016-10-12 MED ORDER — LIDOCAINE 2% (20 MG/ML) 5 ML SYRINGE
INTRAMUSCULAR | Status: AC
  Filled 2016-10-12: qty 5

## 2016-10-12 MED ORDER — ALBUMIN HUMAN 5 % IV SOLN
12.5000 g | Freq: Once | INTRAVENOUS | Status: AC
  Administered 2016-10-12: 12.5 g via INTRAVENOUS

## 2016-10-12 MED ORDER — FERROUS SULFATE 325 (65 FE) MG PO TABS
325.0000 mg | ORAL_TABLET | Freq: Two times a day (BID) | ORAL | Status: DC
  Administered 2016-10-12 – 2016-10-14 (×4): 325 mg via ORAL
  Filled 2016-10-12 (×4): qty 1

## 2016-10-12 MED ORDER — TAMSULOSIN HCL 0.4 MG PO CAPS
0.4000 mg | ORAL_CAPSULE | Freq: Every day | ORAL | Status: DC
  Administered 2016-10-12 – 2016-10-13 (×2): 0.4 mg via ORAL
  Filled 2016-10-12 (×2): qty 1

## 2016-10-12 MED ORDER — SUGAMMADEX SODIUM 200 MG/2ML IV SOLN
INTRAVENOUS | Status: AC
  Filled 2016-10-12: qty 2

## 2016-10-12 MED ORDER — HYDROCODONE-ACETAMINOPHEN 5-325 MG PO TABS: 1.0000 | ORAL_TABLET | ORAL | Status: DC | PRN

## 2016-10-12 MED ORDER — FUROSEMIDE 20 MG PO TABS
20.0000 mg | ORAL_TABLET | Freq: Every day | ORAL | Status: DC
  Administered 2016-10-12 – 2016-10-14 (×3): 20 mg via ORAL
  Filled 2016-10-12 (×3): qty 1

## 2016-10-12 MED ORDER — METOPROLOL TARTRATE 25 MG PO TABS
12.5000 mg | ORAL_TABLET | Freq: Two times a day (BID) | ORAL | Status: DC
  Administered 2016-10-12 – 2016-10-14 (×4): 12.5 mg via ORAL
  Filled 2016-10-12 (×4): qty 1

## 2016-10-12 MED ORDER — DEXAMETHASONE SODIUM PHOSPHATE 10 MG/ML IJ SOLN
INTRAMUSCULAR | Status: DC | PRN
  Administered 2016-10-12: 10 mg via INTRAVENOUS

## 2016-10-12 MED ORDER — SODIUM CHLORIDE 0.9% FLUSH: 3.0000 mL | Freq: Two times a day (BID) | INTRAVENOUS | Status: DC

## 2016-10-12 MED ORDER — ACETAMINOPHEN 10 MG/ML IV SOLN
1000.0000 mg | Freq: Four times a day (QID) | INTRAVENOUS | Status: AC
  Administered 2016-10-12 – 2016-10-13 (×4): 1000 mg via INTRAVENOUS
  Filled 2016-10-12 (×5): qty 100

## 2016-10-12 MED ORDER — PROPOFOL 10 MG/ML IV BOLUS
INTRAVENOUS | Status: DC | PRN
  Administered 2016-10-12: 100 mg via INTRAVENOUS

## 2016-10-12 MED ORDER — DIPHENHYDRAMINE HCL 12.5 MG/5ML PO ELIX: 12.5000 mg | ORAL_SOLUTION | Freq: Four times a day (QID) | ORAL | Status: DC | PRN

## 2016-10-12 MED ORDER — LACTATED RINGERS IR SOLN
Status: DC | PRN
  Administered 2016-10-12: 1

## 2016-10-12 MED ORDER — SUFENTANIL CITRATE 50 MCG/ML IV SOLN
INTRAVENOUS | Status: DC | PRN
  Administered 2016-10-12 (×2): 5 ug via INTRAVENOUS
  Administered 2016-10-12: 15 ug via INTRAVENOUS
  Administered 2016-10-12: 5 ug via INTRAVENOUS

## 2016-10-12 MED ORDER — BUPIVACAINE LIPOSOME 1.3 % IJ SUSP
20.0000 mL | Freq: Once | INTRAMUSCULAR | Status: AC
  Administered 2016-10-12: 20 mL
  Filled 2016-10-12: qty 20

## 2016-10-12 MED ORDER — MORPHINE SULFATE (PF) 2 MG/ML IV SOLN: 2.0000 mg | INTRAVENOUS | Status: DC | PRN

## 2016-10-12 MED ORDER — KCL IN DEXTROSE-NACL 20-5-0.45 MEQ/L-%-% IV SOLN
INTRAVENOUS | Status: DC
  Administered 2016-10-12: 16:00:00 via INTRAVENOUS
  Filled 2016-10-12 (×2): qty 1000

## 2016-10-12 MED ORDER — CEFAZOLIN SODIUM-DEXTROSE 2-4 GM/100ML-% IV SOLN
2.0000 g | INTRAVENOUS | Status: AC
  Administered 2016-10-12: 2 g via INTRAVENOUS

## 2016-10-12 MED ORDER — SODIUM CHLORIDE 0.9 % IV SOLN
INTRAVENOUS | Status: DC | PRN
  Administered 2016-10-12: 10:00:00 via INTRAVENOUS

## 2016-10-12 MED ORDER — SUCCINYLCHOLINE CHLORIDE 200 MG/10ML IV SOSY
PREFILLED_SYRINGE | INTRAVENOUS | Status: AC
  Filled 2016-10-12: qty 10

## 2016-10-12 MED ORDER — ONDANSETRON HCL 4 MG/2ML IJ SOLN: 4.0000 mg | INTRAMUSCULAR | Status: DC | PRN

## 2016-10-12 MED ORDER — LACTATED RINGERS IV SOLN
INTRAVENOUS | Status: DC | PRN
  Administered 2016-10-12: 10:00:00 via INTRAVENOUS

## 2016-10-12 MED ORDER — ONDANSETRON HCL 4 MG/2ML IJ SOLN
INTRAMUSCULAR | Status: DC | PRN
Start: 2016-10-12 — End: 2016-10-12
  Administered 2016-10-12: 4 mg via INTRAVENOUS

## 2016-10-12 MED ORDER — PHENYLEPHRINE HCL 10 MG/ML IJ SOLN
INTRAVENOUS | Status: DC | PRN
  Administered 2016-10-12: 50 ug/min via INTRAVENOUS

## 2016-10-12 MED ORDER — GABAPENTIN 300 MG PO CAPS
300.0000 mg | ORAL_CAPSULE | Freq: Every day | ORAL | Status: DC
  Administered 2016-10-12 – 2016-10-13 (×2): 300 mg via ORAL
  Filled 2016-10-12 (×2): qty 1

## 2016-10-12 MED ORDER — SENNA 8.6 MG PO TABS
1.0000 | ORAL_TABLET | Freq: Two times a day (BID) | ORAL | Status: DC
  Administered 2016-10-12 – 2016-10-14 (×3): 8.6 mg via ORAL
  Filled 2016-10-12 (×3): qty 1

## 2016-10-12 MED ORDER — STERILE WATER FOR IRRIGATION IR SOLN
Status: DC | PRN
  Administered 2016-10-12: 1000 mL

## 2016-10-12 MED ORDER — DEXAMETHASONE SODIUM PHOSPHATE 10 MG/ML IJ SOLN
INTRAMUSCULAR | Status: AC
  Filled 2016-10-12: qty 1

## 2016-10-12 MED ORDER — SODIUM CHLORIDE 0.9 % IJ SOLN
INTRAMUSCULAR | Status: DC | PRN
  Administered 2016-10-12: 20 mL via INTRAVENOUS

## 2016-10-12 MED ORDER — BRIMONIDINE TARTRATE 0.15 % OP SOLN
1.0000 [drp] | Freq: Three times a day (TID) | OPHTHALMIC | Status: DC
Start: 2016-10-12 — End: 2016-10-14
  Administered 2016-10-12 – 2016-10-14 (×5): 1 [drp] via OPHTHALMIC
  Filled 2016-10-12: qty 5

## 2016-10-12 MED ORDER — CEFAZOLIN IN D5W 1 GM/50ML IV SOLN
1.0000 g | Freq: Three times a day (TID) | INTRAVENOUS | Status: AC
  Administered 2016-10-12 (×2): 1 g via INTRAVENOUS
  Filled 2016-10-12 (×2): qty 50

## 2016-10-12 MED ORDER — PROPOFOL 10 MG/ML IV BOLUS
INTRAVENOUS | Status: AC
  Filled 2016-10-12: qty 20

## 2016-10-12 MED ORDER — FENTANYL CITRATE (PF) 100 MCG/2ML IJ SOLN
50.0000 ug | INTRAMUSCULAR | Status: AC | PRN
  Administered 2016-10-12 (×3): 50 ug via INTRAVENOUS

## 2016-10-12 MED ORDER — HYDROCODONE-ACETAMINOPHEN 5-325 MG PO TABS: 1.0000 | ORAL_TABLET | Freq: Four times a day (QID) | ORAL | 0 refills | Status: DC | PRN

## 2016-10-12 MED ORDER — SUGAMMADEX SODIUM 200 MG/2ML IV SOLN
INTRAVENOUS | Status: DC | PRN
  Administered 2016-10-12: 200 mg via INTRAVENOUS

## 2016-10-12 MED ORDER — SODIUM CHLORIDE 0.9 % IJ SOLN
INTRAMUSCULAR | Status: AC
  Filled 2016-10-12: qty 20

## 2016-10-12 MED ORDER — ONDANSETRON HCL 4 MG/2ML IJ SOLN
INTRAMUSCULAR | Status: AC
  Filled 2016-10-12: qty 2

## 2016-10-12 MED ORDER — LIDOCAINE 2% (20 MG/ML) 5 ML SYRINGE
INTRAMUSCULAR | Status: DC | PRN
  Administered 2016-10-12: 50 mg via INTRAVENOUS

## 2016-10-12 MED ORDER — CEFAZOLIN SODIUM-DEXTROSE 2-4 GM/100ML-% IV SOLN
INTRAVENOUS | Status: AC
  Filled 2016-10-12: qty 100

## 2016-10-12 MED ORDER — SUFENTANIL CITRATE 50 MCG/ML IV SOLN
INTRAVENOUS | Status: AC
  Filled 2016-10-12: qty 1

## 2016-10-12 MED ORDER — ROCURONIUM BROMIDE 10 MG/ML (PF) SYRINGE
PREFILLED_SYRINGE | INTRAVENOUS | Status: DC | PRN
  Administered 2016-10-12 (×2): 10 mg via INTRAVENOUS
  Administered 2016-10-12: 50 mg via INTRAVENOUS

## 2016-10-12 MED ORDER — LEVOTHYROXINE SODIUM 50 MCG PO TABS
50.0000 ug | ORAL_TABLET | Freq: Every day | ORAL | Status: DC
  Administered 2016-10-12 – 2016-10-13 (×2): 50 ug via ORAL
  Filled 2016-10-12 (×2): qty 1

## 2016-10-12 MED ORDER — FENTANYL CITRATE (PF) 100 MCG/2ML IJ SOLN: 25.0000 ug | INTRAMUSCULAR | Status: DC | PRN

## 2016-10-12 SURGICAL SUPPLY — 65 items
BAG LAPAROSCOPIC 12 15 PORT 16 (BASKET) ×1 IMPLANT
BAG RETRIEVAL 12/15 (BASKET) ×2
BAG RETRIEVAL 12/15MM (BASKET) ×1
CHLORAPREP W/TINT 26ML (MISCELLANEOUS) ×3 IMPLANT
CLIP LIGATING HEM O LOK PURPLE (MISCELLANEOUS) ×3 IMPLANT
CLIP LIGATING HEMO LOK XL GOLD (MISCELLANEOUS) IMPLANT
CLIP LIGATING HEMO O LOK GREEN (MISCELLANEOUS) ×6 IMPLANT
COVER SURGICAL LIGHT HANDLE (MISCELLANEOUS) ×3 IMPLANT
COVER TIP SHEARS 8 DVNC (MISCELLANEOUS) ×1 IMPLANT
COVER TIP SHEARS 8MM DA VINCI (MISCELLANEOUS) ×2
CUTTER ECHEON FLEX ENDO 45 340 (ENDOMECHANICALS) ×3 IMPLANT
DECANTER SPIKE VIAL GLASS SM (MISCELLANEOUS) ×3 IMPLANT
DERMABOND ADVANCED (GAUZE/BANDAGES/DRESSINGS) ×2
DERMABOND ADVANCED .7 DNX12 (GAUZE/BANDAGES/DRESSINGS) ×1 IMPLANT
DRAIN CHANNEL 15F RND FF 3/16 (WOUND CARE) ×3 IMPLANT
DRAPE ARM DVNC X/XI (DISPOSABLE) ×4 IMPLANT
DRAPE COLUMN DVNC XI (DISPOSABLE) ×1 IMPLANT
DRAPE DA VINCI XI ARM (DISPOSABLE) ×8
DRAPE DA VINCI XI COLUMN (DISPOSABLE) ×2
DRAPE INCISE IOBAN 66X45 STRL (DRAPES) ×3 IMPLANT
DRAPE SHEET LG 3/4 BI-LAMINATE (DRAPES) ×3 IMPLANT
DRSG TEGADERM 4X4.75 (GAUZE/BANDAGES/DRESSINGS) ×3 IMPLANT
ELECT PENCIL ROCKER SW 15FT (MISCELLANEOUS) ×3 IMPLANT
ELECT REM PT RETURN 15FT ADLT (MISCELLANEOUS) ×3 IMPLANT
EVACUATOR SILICONE 100CC (DRAIN) ×3 IMPLANT
GAUZE SPONGE 2X2 8PLY STRL LF (GAUZE/BANDAGES/DRESSINGS) ×1 IMPLANT
GLOVE BIO SURGEON STRL SZ 6.5 (GLOVE) ×2 IMPLANT
GLOVE BIO SURGEONS STRL SZ 6.5 (GLOVE) ×1
GLOVE BIOGEL M STRL SZ7.5 (GLOVE) ×6 IMPLANT
GOWN STRL REUS W/TWL LRG LVL3 (GOWN DISPOSABLE) ×9 IMPLANT
IRRIG SUCT STRYKERFLOW 2 WTIP (MISCELLANEOUS)
IRRIGATION SUCT STRKRFLW 2 WTP (MISCELLANEOUS) IMPLANT
KIT BASIN OR (CUSTOM PROCEDURE TRAY) ×3 IMPLANT
MARKER SKIN DUAL TIP RULER LAB (MISCELLANEOUS) ×3 IMPLANT
NEEDLE INSUFFLATION 14GA 120MM (NEEDLE) ×3 IMPLANT
NS IRRIG 1000ML POUR BTL (IV SOLUTION) ×3 IMPLANT
PORT ACCESS TROCAR AIRSEAL 12 (TROCAR) ×1 IMPLANT
PORT ACCESS TROCAR AIRSEAL 5M (TROCAR) ×2
POSITIONER SURGICAL ARM (MISCELLANEOUS) ×6 IMPLANT
RELOAD STAPLER WHITE 60MM (STAPLE) IMPLANT
RELOAD WH ECHELON 45 (STAPLE) ×12 IMPLANT
SCISSORS LAP 5X45 EPIX DISP (ENDOMECHANICALS) ×6 IMPLANT
SEAL CANN UNIV 5-8 DVNC XI (MISCELLANEOUS) ×4 IMPLANT
SEAL XI 5MM-8MM UNIVERSAL (MISCELLANEOUS) ×8
SET TRI-LUMEN FLTR TB AIRSEAL (TUBING) ×3 IMPLANT
SOLUTION ELECTROLUBE (MISCELLANEOUS) ×3 IMPLANT
SPONGE GAUZE 2X2 STER 10/PKG (GAUZE/BANDAGES/DRESSINGS) ×2
STAPLE ECHEON FLEX 60 POW ENDO (STAPLE) IMPLANT
STAPLER RELOAD WHITE 60MM (STAPLE)
SUT ETHILON 3 0 PS 1 (SUTURE) ×3 IMPLANT
SUT MNCRL AB 4-0 PS2 18 (SUTURE) ×6 IMPLANT
SUT PDS AB 1 CT1 27 (SUTURE) ×9 IMPLANT
SUT VIC AB 2-0 SH 27 (SUTURE) ×2
SUT VIC AB 2-0 SH 27X BRD (SUTURE) ×1 IMPLANT
SUT VLOC BARB 180 ABS3/0GR12 (SUTURE) ×3
SUTURE VLOC BRB 180 ABS3/0GR12 (SUTURE) ×1 IMPLANT
TAPE STRIPS DRAPE STRL (GAUZE/BANDAGES/DRESSINGS) ×3 IMPLANT
TOWEL OR 17X26 10 PK STRL BLUE (TOWEL DISPOSABLE) ×3 IMPLANT
TOWEL OR NON WOVEN STRL DISP B (DISPOSABLE) ×3 IMPLANT
TRAY FOLEY W/METER SILVER 16FR (SET/KITS/TRAYS/PACK) ×3 IMPLANT
TRAY LAPAROSCOPIC (CUSTOM PROCEDURE TRAY) ×3 IMPLANT
TROCAR BLADELESS OPT 12M 100M (ENDOMECHANICALS) ×3 IMPLANT
TROCAR BLADELESS OPT 5 100 (ENDOMECHANICALS) IMPLANT
TROCAR XCEL 12X100 BLDLESS (ENDOMECHANICALS) ×3 IMPLANT
WATER STERILE IRR 1500ML POUR (IV SOLUTION) ×3 IMPLANT

## 2016-10-12 NOTE — Interval H&P Note (Signed)
History and Physical Interval Note:  10/12/2016 7:22 AM  Austin Jackson  has presented today for surgery, with the diagnosis of RIGHT RENAL PELVIS CANCER  The various methods of treatment have been discussed with the patient and family. After consideration of risks, benefits and other options for treatment, the patient has consented to  Procedure(s): XI ROBOT ASSITED LAPAROSCOPIC NEPHROURETERECTOMY (Right) as a surgical intervention .  The patient's history has been reviewed, patient examined, no change in status, stable for surgery.  I have reviewed the patient's chart and labs.  Questions were answered to the patient's satisfaction.     Neila Teem

## 2016-10-12 NOTE — Anesthesia Postprocedure Evaluation (Signed)
Anesthesia Post Note  Patient: OSEIAS HORSEY  Procedure(s) Performed: Procedure(s) (LRB): XI ROBOT ASSITED LAPAROSCOPIC NEPHROURETERECTOMY (Right)  Patient location during evaluation: PACU Anesthesia Type: General Level of consciousness: awake and alert, oriented and patient cooperative Pain management: pain level controlled Vital Signs Assessment: post-procedure vital signs reviewed and stable Respiratory status: spontaneous breathing, nonlabored ventilation, respiratory function stable and patient connected to nasal cannula oxygen Cardiovascular status: blood pressure returned to baseline and stable Postop Assessment: no signs of nausea or vomiting Anesthetic complications: no       Last Vitals:  Vitals:   10/12/16 1400 10/12/16 1415  BP: (!) 153/86 (!) 162/91  Pulse: (!) 112 100  Resp: 17 17  Temp:  36.4 C    Last Pain:  Vitals:   10/12/16 1459  TempSrc:   PainSc: 1                  Lafaye Mcelmurry,E. Kynadee Dam

## 2016-10-12 NOTE — Brief Op Note (Signed)
10/12/2016  10:45 AM  PATIENT:  Austin Jackson  83 y.o. male  PRE-OPERATIVE DIAGNOSIS:  RIGHT RENAL PELVIS CANCER  POST-OPERATIVE DIAGNOSIS:  RIGHT RENAL PELVIS CANCER  PROCEDURE:  Procedure(s): XI ROBOT ASSITED LAPAROSCOPIC NEPHROURETERECTOMY (Right)  SURGEON:  Surgeon(s) and Role:    * [name], MD - Primary  PHYSICIAN ASSISTANT:   ASSISTANTS: Clemetine Marker PA   ANESTHESIA:   local and general  EBL:  Total I/O In: 1683 [I.V.:1400; Blood:335] Out: 700 [Urine:400; Blood:300]  BLOOD ADMINISTERED:1 unit CC PRBC  DRAINS: 1 -JP to bulb, 2- Foley to gravtiy   LOCAL MEDICATIONS USED:  MARCAINE     SPECIMEN:  Source of Specimen:  Rt kidney + ureter + bladder cuff  DISPOSITION OF SPECIMEN:  PATHOLOGY  COUNTS:  YES  TOURNIQUET:  * No tourniquets in log *  DICTATION: .Other Dictation: Dictation Number [id number]  PLAN OF CARE: Admit to inpatient   PATIENT DISPOSITION:  PACU - hemodynamically stable.   Delay start of Pharmacological VTE agent (>24hrs) due to surgical blood loss or risk of bleeding: yes

## 2016-10-12 NOTE — Anesthesia Preprocedure Evaluation (Addendum)
Anesthesia Evaluation  Patient identified by MRN, date of birth, ID band Patient awake    Reviewed: Allergy & Precautions, NPO status , Patient's Chart, lab work & pertinent test results  History of Anesthesia Complications Negative for: history of anesthetic complications  Airway Mallampati: I  TM Distance: >3 FB Neck ROM: Full    Dental  (+) Dental Advisory Given   Pulmonary COPD, former smoker,    breath sounds clear to auscultation       Cardiovascular hypertension, Pt. on medications and Pt. on home beta blockers (-) angina+ dysrhythmias Atrial Fibrillation  Rhythm:Irregular Rate:Normal     Neuro/Psych negative neurological ROS     GI/Hepatic Neg liver ROS, GERD  Controlled,  Endo/Other  Hypothyroidism   Renal/GU Renal InsufficiencyRenal disease (creat 1.63)     Musculoskeletal  (+) Arthritis ,   Abdominal   Peds  Hematology  (+) anemia , Hb 9.6   Anesthesia Other Findings   Reproductive/Obstetrics                            Anesthesia Physical Anesthesia Plan  ASA: III  Anesthesia Plan: General   Post-op Pain Management:    Induction: Intravenous  Airway Management Planned: Oral ETT  Additional Equipment:   Intra-op Plan:   Post-operative Plan: Extubation in OR  Informed Consent: I have reviewed the patients History and Physical, chart, labs and discussed the procedure including the risks, benefits and alternatives for the proposed anesthesia with the patient or authorized representative who has indicated his/her understanding and acceptance.   Dental advisory given  Plan Discussed with: CRNA and Surgeon  Anesthesia Plan Comments: (Plan routine monitors, GETA)        Anesthesia Quick Evaluation

## 2016-10-12 NOTE — Op Note (Deleted)
  The note originally documented on this encounter has been moved the the encounter in which it belongs.  

## 2016-10-12 NOTE — Transfer of Care (Signed)
Immediate Anesthesia Transfer of Care Note  Patient: DEVARIS QUIRK  Procedure(s) Performed: Procedure(s): XI ROBOT ASSITED LAPAROSCOPIC NEPHROURETERECTOMY (Right)  Patient Location: PACU  Anesthesia Type:General  Level of Consciousness: awake  Airway & Oxygen Therapy: Patient Spontanous Breathing and Patient connected to face mask oxygen  Post-op Assessment: Report given to RN and Post -op Vital signs reviewed and stable  Post vital signs: Reviewed and stable  Last Vitals:  Vitals:   10/12/16 0528  BP: 117/77  Pulse: 70  Resp: 16  Temp: 36.3 C    Last Pain:  Vitals:   10/12/16 0528  TempSrc: Oral      Patients Stated Pain Goal: 3 (40/33/53 3174)  Complications: No apparent anesthesia complications

## 2016-10-12 NOTE — Op Note (Signed)
NAMEWHIT, BRUNI NO.:  [id number]  MEDICAL RECORD NO.:  [id number]  LOCATION:                                 FACILITY:  PHYSICIAN:  [name], MD     DATE OF BIRTH:  June 15, 1920   DATE OF PROCEDURE: 10/12/2016                                OPERATIVE REPORT   DIAGNOSIS:  Large volume right renal pelvis cancer with recurrent bleeding and symptomatic anemia.  PROCEDURE:  Robotic-assisted laparoscopic right nephroureterectomy.  ESTIMATED BLOOD LOSS:  200 mL.  COMPLICATION:  None.  SPECIMENS:  Right kidney, ureter, and bladder cuff for permanent pathology.  ASSISTANT:  Debbrah Alar, PA.  DRAINS: 1. Jackson-Pratt drain to bulb suction. 2. Foley catheter straight drain.  FINDINGS: 1. Significant desmoplastic reaction around the area of the renal     pelvis especially medially.  This is concerning for likely locally     advanced disease. 2. Single artery, single vein, right renovascular anatomy as     anticipated.  INDICATION:  [name] is a very pleasant and quite vigorous 83 year old gentleman, who was found on workup of gross hematuria to have large volume right renal pelvis cancer.  This is on a high-grade biopsy.  He also has multifocal bladder tumor including subsequent stricturing of the left ureterovesical junction.  He does have recurrent symptomatic anemia from bleeding from his right renal pelvis and his right kidney is quite atrophic.  Options were discussed for management including palliative only protocols versus p.r.n. laser ablation and transfusions versus more definitive management with right nephroureterectomy with both therapeutic and palliative intent and he adamantly wished to proceed with this.  He and his son, who is a physician and we have discussed the risk that this surgery poses at his age and also the natural history of disease.  They both had very good understanding of this and wished to proceed today.   Informed consent was then placed in the medical record.  DESCRIPTION OF PROCEDURE:  The patient being, Austin Jackson, verified. Procedure being right nephroureterectomy was confirmed.  Procedure was carried out.  Time-out was performed.  Intravenous antibiotics were administered.  General endotracheal anesthesia was introduced.  Foley catheter was placed per urethra to straight drain.  The patient was then placed right side up, full flank position, applying 15 degrees of stable flexion, superior arm elevator, axillary roll, sequential compression devices, bottom leg bent, top leg straight.  He was further fashioned on the operating table using 3-inch tape over foam padding across his supraxiphoid chest and his pelvis.  He was found to be suitably positioned.  Sterile field was created by prepping and draping the patient's entire right flank and abdomen using chlorhexidine gluconate. A high-flow low-pressure pneumoperitoneum was obtained using Veress technique in the right lower quadrant having passed the aspiration and drop test.  An 8-mm robotic camera port was then placed in position approximately 4 fingerbreadths superior lateral to the umbilicus. Laparoscopic examination of the peritoneal cavity revealed no significant adhesions and no visceral injury.  Additional ports were placed as follows:  Right subcostal 8-mm robotic port, right far lateral 8-mm robotic port approximately 4 fingerbreadths superior medial to the  anterior superior iliac spine.  Right inferior paramedian robotic port approximately 3 fingerbreadths above the pubic ramus and two 12-mm assistant port sites in the midline, one 2 fingerbreadths above the camera port, and another approximately a handbreadth below the camera port in the infraumbilical location.  Robot was docked and passed through the electronic checks.  Initial attention was directed to the development of retroperitoneum.  Incision was made lateral to  the ascending colon from the area of the cecum towards the area of the hepatic flexure.  This was carefully swept medially.  An additional 5-mm port was placed inside subxiphoid location through which a self locking laparoscopic grasper was used to place the liver in gentle superior traction.  The duodenum was encountered and very carefully kocherized medially.  There was already, at this point, noted significant desmoplastic reaction in the area of the anterior medial surface of the kidney.  Exquisite care was taken to avoid duodenal injury, which did not occur grossly.  The lower pole of the kidney area was identified, placed on gentle lateral traction.  Dissection proceeded medial to this. The ureter, which had been previously stent was located and also placed on gentle lateral traction.  The gonadal vessels were identified and swept medially.  Dissection proceeded within this plane towards the area of the renal hilum.  There was severe desmoplastic reaction noted around the lower pole area, renal pelvis, and psoas musculature in medially along the area of the inferior vena cava.  Exquisite care was taken. Very slow and persistent dissection of the area freeing this adherent tissue away between the great vessels and kidney toward the area of the renal hilum.  Renal hilum consisted of single artery, single vein, right renovascular anatomy as anticipated.  The artery was controlled using extra large Hem-O-Lok clip down followed by vascular stapler distally. The single vein was controlled with vascular load stapler.  Medial adrenal attachments were taken down using vascular stapler and anterior and lateral attachments were taken down using cautery scissors.  This completely freed the nephrectomy portion of the specimen.  The patient had lost, at this point, approximately 150 mL of blood and given his baseline anemia, decision was made to transfuse 1 unit of blood. Attention was then  directed to the pelvic ureteral dissection. Incision was made lateral to the right medial umbilical ligament from the anterior abdominal wall towards the area of the iliac vessels.  This was used as a bucket handle and peritoneal flap that was swept towards the inferior plane of dissection becoming the floor of distal ureteral dissection.  The ureter was then circumferentially mobilized distally towards the area of the ureterovesical junction, past the area of the iliac vessels.  Great care was taken to avoid injury to these structures.  At the ureterovesical junction, bladder fibers were clearly seen in a single stay stitch of 3-0 V-Loc was applied approximately 1 cm medial to the ureterovesical junction and intramural dissection was then performed to the level of the bladder.  In-situ stent was identified and left up to be taken with the specimen and final ureteral dissection was performed.  This completely freeing up the right nephroureterectomy. Specimen was placed in EndoCatch bag for later retrieval.  The ureteral stump and bladder hiatus were then oversewn using the previous stay suture in a running fashion laterally over the area of ureteral stump, which resulted in excellent hemostasis and imbricating of the bladder trigone area.  At this point, the area was once again inspected.  All sponge and needle counts were correct.  Hemostasis appeared excellent. A closed suction drain was brought out through the previous lateral most robotic port site into the anterior peritoneal cavity.  Robot was then undocked.  Specimen retrieved by extending the previous inferior assistant port site towards the umbilicus approximately 5 cm in total removing the nephroureterectomy specimen and setting it aside for pathology.  This site was closed at the level of the fascia using figure- of-eight PDS x4 followed by reapproximation of Scarpa's with running Vicryl.  All incision sites were then  infiltrated with dilute lyophilized Marcaine and closed at the level of the skin using subcuticular Monocryl followed by Dermabond.  The procedure was then terminated.  The patient tolerated the procedure well.  There were no immediate periprocedural complications.  The patient was taken to the Postanesthesia Care Unit in stable condition.    ______________________________ [name], MD   ______________________________ [name], MD    TM/MEDQ  D:  10/12/2016  T:  10/12/2016  Job:  [id number]

## 2016-10-12 NOTE — H&P (Signed)
Austin Jackson is an 83 y.o. male.    Chief Complaint: Pre-op RIGHT Robotic Nephroureterectomy  HPI:   1 - Right Renal Pelvis / Ureteral Cancer in Atrophic Right Kidney - large right renal pelvis mass with large hydro on [place] and CT x several 2017 on eval gross hematuria. 1 artery / 1 vein right renovascular anatomy. No associated large adenopathy. BX-proven high-grade urothelial carcinoma 07/2016.    PMH sig for AFib (not anticaogulated), mild CHF/lasix (still able to walk [facility]n [facility]), gloucoma. Bilateral inguinal hernia repair. His son Austin Jackson is ER MD in [place]. His PCP is Dr. Eula Jackson in [place].   Today " Austin Jackson " is seen to proceed with RIGHT neph-U for large volume right renal pelvis cancer with refracotry bleeding. Clinically localized. Most recetn Cr 1.6, Most recent Hgb 9.6.   Past Medical History:  Diagnosis Date  . Bilateral lower extremity edema   . Bladder cancer ([place])   . CHF (congestive heart failure) (H[facility])    mild  . Chronic atrial fibrillation ([place])    dx 2002--  s/p cardioversion approx 2006 or 2007-- per pt last cardiologist visit at that time-- currently be followed by pcp dr Theodis Blaze in [place]  . CKD (chronic kidney disease), stage III   . Dysrhythmia   . Full dentures   . GERD (gastroesophageal reflux disease)   . Glaucoma, both eyes   . Gross hematuria    intermittent  . History of BPH   . History of GI bleed    2013--  per pt unable to find reason but had been on blood thinner,  was transfusion's  . History of kidney stones   . Hypertension   . Hypothyroidism due to amiodarone   . Iron deficiency anemia   . Macular degeneration of both eyes   . OA (osteoarthritis)   . PONV (postoperative nausea and vomiting)   . Renal mass, right    renal pelvis    Past Surgical History:  Procedure Laterality Date  . CARDIOVERSION  2002  . COLONOSCOPY  yrs ago  . CYSTOSCOPY WITH RETROGRADE PYELOGRAM, URETEROSCOPY AND STENT PLACEMENT Left 07/19/2016    Procedure: CYSTOSCOPY WITH RETROGRADE PYELOGRAM,  AND STENT PLACEMENT;  Surgeon: [name], MD;  Location: [facility];  Service: Urology;  Laterality: Left;  . CYSTOSCOPY/RETROGRADE/URETEROSCOPY Bilateral 09/15/2016   Procedure: CYSTOSCOPY/ RETROGRADE/DIAGNOSITC URETEROSCOPY/URETERAL STENT EXCHANGE;  Surgeon: [name], MD;  Location: [facility];  Service: Urology;  Laterality: Bilateral;  . EXTRACORPOREAL SHOCK WAVE LITHOTRIPSY  1970's  . EYE SURGERY Left    for glaucoma  . EYE SURGERY Bilateral    cataract extraction with IOL  . INGUINAL HERNIA REPAIR Bilateral 2007 and 1980's  . TRANSURETHRAL RESECTION OF BLADDER TUMOR Right 07/12/2016   Procedure: TRANSURETHRAL RESECTION OF BLADDER TUMOR (TURBT)/ BILATERAL RETROGRADE PYLEOGRAM/ RIGHT DIAGNOSTIC URETEROSCOPY;  Surgeon: [name], MD;  [facility];  Service: Urology;  Laterality: Right;  . TRANSURETHRAL RESECTION OF PROSTATE  2002    Family History  Problem Relation Age of Onset  . Kidney cancer Neg Hx   . Diabetes Mellitus II Neg Hx   . CAD Neg Hx    Social History:  reports that he quit smoking about 32 years ago. His smoking use included Cigarettes. He has a 60.00 pack-year smoking history. He has never used smokeless tobacco. He reports that he drinks alcohol. He reports that he does not use drugs.  Allergies:  Allergies  Allergen Reactions  . Codeine  Nausea And Vomiting  . Ultram [Tramadol Hcl] Other (See Comments)    Confusion     Medications Prior to Admission  Medication Sig Dispense Refill  . acetaminophen (TYLENOL) 500 MG tablet Take 1,000 mg by mouth every 12 (twelve) hours. 6 am and 6 pm    . ALPHAGAN P 0.1 % SOLN Place 1 drop into both eyes every 8 (eight) hours.   5  . calcium carbonate (TUMS EX) 750 MG chewable tablet Chew 750 tablets by mouth 2 (two) times daily as needed for heartburn.    . Ferrous Sulfate (IRON) 325 (65 Fe) MG TABS Take 325 mg by mouth 2 (two) times daily.     .  furosemide (LASIX) 20 MG tablet Take 20 mg by mouth daily.     Marland Kitchen gabapentin (NEURONTIN) 300 MG capsule Take 300 mg by mouth at bedtime.    Marland Kitchen latanoprost (XALATAN) 0.005 % ophthalmic solution Place 1 drop into both eyes at bedtime.     Marland Kitchen levothyroxine (SYNTHROID, LEVOTHROID) 50 MCG tablet Take 50 mcg by mouth at bedtime.    . metoprolol tartrate (LOPRESSOR) 25 MG tablet Take 12.5 mg by mouth 2 (two) times daily.     . tamsulosin (FLOMAX) 0.4 MG CAPS capsule Take 0.4 mg by mouth daily after supper.      No results found for this or any previous visit (from the past 48 hour(s)). No results found.  Review of Systems  Constitutional: Negative.  Negative for chills and fever.  HENT: Negative.   Eyes: Negative.   Respiratory: Negative.   Cardiovascular: Negative.   Gastrointestinal: Negative.   Genitourinary: Positive for hematuria and urgency.  Musculoskeletal: Negative.   Skin: Negative.   Neurological: Negative.   Endo/Heme/Allergies: Negative.   Psychiatric/Behavioral: Negative.     Blood pressure 117/77, pulse 70, temperature 97.4 F (36.3 C), temperature source Oral, resp. rate 16, height 5\' 10"  (1.778 m), weight 68.7 kg (151 lb 6 oz), SpO2 100 %. Physical Exam  Constitutional: He is oriented to person, place, and time. He appears well-developed.  At baseline.   HENT:  Head: Normocephalic.  Eyes:  cateracts  Neck: Normal range of motion.  Cardiovascular: Normal rate.   Respiratory: Effort normal.  GI: Soft.  Genitourinary:  Genitourinary Comments: No CVAT at present.   Neurological: He is alert and oriented to person, place, and time.  Skin: Skin is warm.  Psychiatric: He has a normal mood and affect.     Assessment/Plan  Proceed as planned with RIGHT neph-U today. Pt and family have very good understanidng of risks, benefits, alternatives (including non-treatment), and expected peri-op course. They all very much understand that his advanced age places him at increased  risk of ALL peri-op complications including mortality.   [name], MD 10/12/2016, 6:17 AM

## 2016-10-12 NOTE — Discharge Instructions (Signed)

## 2016-10-12 NOTE — Anesthesia Procedure Notes (Signed)
Procedure Name: Intubation Date/Time: 10/12/2016 7:38 AM Performed by: Danley Danker L Patient Re-evaluated:Patient Re-evaluated prior to inductionOxygen Delivery Method: Circle system utilized Preoxygenation: Pre-oxygenation with 100% oxygen Intubation Type: IV induction Ventilation: Mask ventilation without difficulty and Oral airway inserted - appropriate to patient size Laryngoscope Size: Miller and 3 Grade View: Grade I Tube type: Oral Tube size: 8.0 mm Number of attempts: 1 Airway Equipment and Method: Stylet Placement Confirmation: ETT inserted through vocal cords under direct vision,  positive ETCO2 and breath sounds checked- equal and bilateral Secured at: 22 cm Tube secured with: Tape Dental Injury: Teeth and Oropharynx as per pre-operative assessment

## 2016-10-12 NOTE — Discharge Summary (Signed)
Physician Discharge Summary  Patient ID: Austin Jackson MRN: [id number] DOB/AGE: 08-05-1919 83 y.o.  Admit date: 10/12/2016 Discharge date: 10/14/2016  Admission Diagnoses: Right upper tract urothelial cancer  Discharge Diagnoses: Same  Discharged Condition: good  Hospital Course:  Austin Jackson is a 83 y.o. male who is s/p RAL right nephroureterectomy on 10/12/16 with Dr. Tresa Moore.   The patient tolerated the procedure well, was extubated in the OR and taken to the recovery unit for routine postoperative care. They were then transferred to the floor. He was transfused with 1U pRBC intraoperatively.   By POD2 he had met the usual goals for discharge including ambulating at a preoperative capacity, having pain controlled with PO PRN medications, and tolerating a regular diet. His Foley catheter will remain in place until follow up.   The patient will follow up with [facility] on 10/23/16. They will be discharged with prescriptions for vicodin & 3 day prescription for bactrim to take around the time of catheter removal. Pathology was not reviewed prior to discharge.   Consults: None  Significant Diagnostic Studies: labs: Cr 1.56, Hb 10.7  Treatments: surgery: as noted above  Discharge Exam: Blood pressure 125/74, pulse 62, temperature 97.5 F (36.4 C), temperature source Oral, resp. rate 18, height 5' 10"  (1.778 m), weight 68.7 kg (151 lb 6 oz), SpO2 98 %.  General:  well-developed and well-nourished elderly male in NAD, lying in bed, alert & oriented, pleasant HEENT: [place]/AT, EOMI, sclera anicteric, hearing grossly intact, no nasal discharge, MMM Respiratory: nonlabored respirations, satting well on RA, symmetrical chest rise Cardiovascular: pulse regular rate & rhythm Abdominal: soft, NTTP, nondistended, surgical incisions c/d/i without signs of exudate/erythema GU: Foley catheter draining light yellow urine  Extremities: warm, well-perfused, no c/c/e Neuro: no focal  deficits   Disposition: 01-Home or Self Care  Discharge Instructions    Activity as tolerated - No restrictions    Complete by:  As directed    Call MD for:    Complete by:  As directed    Temperature >101.5   Call MD for:  persistant nausea and vomiting    Complete by:  As directed    Call MD for:  redness, tenderness, or signs of infection (pain, swelling, redness, odor or green/yellow discharge around incision site)    Complete by:  As directed    Call MD for:  severe uncontrolled pain    Complete by:  As directed    Diet general    Complete by:  As directed    Discharge instructions    Complete by:  As directed    You can shower starting today. Do not soak underwater (baths) for 4 weeks.  No heavy lifting > 10 pounds for 4 weeks.   Your Foley catheter will remain in place until follow up with Dr. Tresa Moore. Start the prescribed antibiotic the day prior to your visit.   Increase activity slowly    Complete by:  As directed    No wound care    Complete by:  As directed      Allergies as of 10/14/2016      Reactions   Codeine Nausea And Vomiting   Ultram [tramadol Hcl] Other (See Comments)   Confusion       Medication List    TAKE these medications   acetaminophen 500 MG tablet Commonly known as:  TYLENOL Take 1,000 mg by mouth every 12 (twelve) hours. 6 am and 6 pm   ALPHAGAN P 0.1 %  Soln Generic drug:  brimonidine Place 1 drop into both eyes every 8 (eight) hours.   calcium carbonate 750 MG chewable tablet Commonly known as:  TUMS EX Chew 750 tablets by mouth 2 (two) times daily as needed for heartburn.   furosemide 20 MG tablet Commonly known as:  LASIX Take 20 mg by mouth daily.   gabapentin 300 MG capsule Commonly known as:  NEURONTIN Take 300 mg by mouth at bedtime.   HYDROcodone-acetaminophen 5-325 MG tablet Commonly known as:  NORCO Take 1-2 tablets by mouth every 6 (six) hours as needed for moderate pain or severe pain.   Iron 325 (65 Fe) MG  Tabs Take 325 mg by mouth 2 (two) times daily.   latanoprost 0.005 % ophthalmic solution Commonly known as:  XALATAN Place 1 drop into both eyes at bedtime.   levothyroxine 50 MCG tablet Commonly known as:  SYNTHROID, LEVOTHROID Take 50 mcg by mouth at bedtime.   metoprolol tartrate 25 MG tablet Commonly known as:  LOPRESSOR Take 12.5 mg by mouth 2 (two) times daily.   tamsulosin 0.4 MG Caps capsule Commonly known as:  FLOMAX Take 0.4 mg by mouth daily after supper.      Follow-up Information    [name], MD Follow up on 10/23/2016.   Specialty:  Urology Why:  at 11AM for X-Ray and office catheter removal.  Contact information: [place] [address] [phone]           Signed: Burnice Logan 10/14/2016, 12:24 PM

## 2016-10-13 LAB — CREATININE, FLUID (PLEURAL, PERITONEAL, JP DRAINAGE): Creat, Fluid: 1.5 mg/dL

## 2016-10-13 LAB — HEMOGLOBIN AND HEMATOCRIT, BLOOD
HEMATOCRIT: 31.1 % — AB (ref 39.0–52.0)
Hemoglobin: 10 g/dL — ABNORMAL LOW (ref 13.0–17.0)

## 2016-10-13 LAB — BASIC METABOLIC PANEL
Anion gap: 5 (ref 5–15)
BUN: 37 mg/dL — ABNORMAL HIGH (ref 6–20)
CALCIUM: 8.4 mg/dL — AB (ref 8.9–10.3)
CO2: 23 mmol/L (ref 22–32)
CREATININE: 1.45 mg/dL — AB (ref 0.61–1.24)
Chloride: 106 mmol/L (ref 101–111)
GFR calc non Af Amer: 40 mL/min — ABNORMAL LOW (ref >60)
GFR, EST AFRICAN AMERICAN: 46 mL/min — AB (ref >60)
GLUCOSE: 179 mg/dL — AB (ref 65–99)
Potassium: 5.2 mmol/L — ABNORMAL HIGH (ref 3.5–5.1)
Sodium: 134 mmol/L — ABNORMAL LOW (ref 135–145)

## 2016-10-13 MED ORDER — MENTHOL 3 MG MT LOZG
1.0000 | LOZENGE | OROMUCOSAL | Status: DC | PRN
  Filled 2016-10-13: qty 9

## 2016-10-13 MED ORDER — ACETAMINOPHEN 500 MG PO TABS
1000.0000 mg | ORAL_TABLET | Freq: Three times a day (TID) | ORAL | Status: DC
  Administered 2016-10-13 – 2016-10-14 (×2): 1000 mg via ORAL
  Filled 2016-10-13 (×2): qty 2

## 2016-10-13 MED ORDER — OXYCODONE HCL 5 MG PO TABS: 2.5000 mg | ORAL_TABLET | ORAL | Status: DC | PRN

## 2016-10-13 NOTE — Care Management Note (Signed)
Case Management Note  Patient Details  Name: Austin Jackson MRN: [id number] Date of Birth: [birth year]  Subjective/Objective:  83 y/o m admitted w/urothelial Ca. From home.PT cons-await recc.                  Action/Plan:d/c plan home.   Expected Discharge Date:                  Expected Discharge Plan:  Home/Self Care  In-House Referral:     Discharge planning Services  CM Consult  Post Acute Care Choice:    Choice offered to:     DME Arranged:    DME Agency:     HH Arranged:    HH Agency:     Status of Service:  In process, will continue to follow  If discussed at Long Length of Stay Meetings, dates discussed:    Additional Comments:  Dessa Phi, RN 10/13/2016, 2:55 PM

## 2016-10-13 NOTE — Progress Notes (Signed)
PT Cancellation Note  Patient Details Name: Austin Jackson MRN: [id number] DOB: 23-Jan-1920   Cancelled Treatment:    Reason Eval/Treat Not Completed: Spoke with RN who reported pt walked x 2 today. PT will evaluate in the a.m. Thanks.   Weston Anna, MPT Pager: [phone]

## 2016-10-13 NOTE — Progress Notes (Signed)
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  2 - Disposition / Rehab - pt independent at baseline and still drives and completes all ADL's. PT eval pendign followign major surgery above.  Today "Austin Jackson" is stable. Some confusion overnight as anticipated with pain meds, but reoriented quickly by son who stayed with him overnight.   Objective: Vital signs in last 24 hours: Temp:  [95.9 F (35.5 C)-98.7 F (37.1 C)] 98.4 F (36.9 C) (04/06 0500) Pulse Rate:  [81-113] 106 (04/06 0500) Resp:  [12-25] 19 (04/06 0500) BP: (122-162)/(65-91) 123/75 (04/06 0500) SpO2:  [95 %-100 %] 96 % (04/06 0500)    Intake/Output from previous day: 04/05 0701 - 04/06 0700 In: 4646.7 [I.V.:3761.7; Blood:335; IV Piggyback:550] Out: 4496 [Urine:1800; Drains:655; Blood:300] Intake/Output this shift: No intake/output data recorded.  General appearance: alert, cooperative, appears stated age and son at bedside Eyes: negative Nose: Nares normal. Septum midline. Mucosa normal. No drainage or sinus tenderness. Throat: lips, mucosa, and tongue normal; teeth and gums normal Neck: supple, symmetrical, trachea midline Back: symmetric, no curvature. ROM normal. No CVA tenderness. Resp: non-labored on minimal [place] O2 Cardio: mild irregulat tachycardia.  GI: soft, non-tender; bowel sounds normal; no masses,  no organomegaly Male genitalia: normal, foley in situe with yellow urine.  Extremities: extremities normal, atraumatic, no cyanosis or edema Skin: Skin color, texture, turgor normal. No rashes or lesions Lymph nodes: Cervical, supraclavicular, and axillary nodes normal. Neurologic: Grossly normal Incision/Wound: recent surgical sites c/d/I. JP with serosanguinous non-foul fluid.   Lab Results:   Recent Labs  10/12/16 1452 10/13/16 0601  HGB 10.4* 10.0*  HCT 32.9* 31.1*   BMET No  results for input(s): NA, K, CL, CO2, GLUCOSE, BUN, CREATININE, CALCIUM in the last 72 hours. PT/INR No results for input(s): LABPROT, INR in the last 72 hours. ABG No results for input(s): PHART, HCO3 in the last 72 hours.  Invalid input(s): PCO2, PO2  Studies/Results: No results found.  Anti-infectives: Anti-infectives    Start     Dose/Rate Route Frequency Ordered Stop   10/12/16 1600  ceFAZolin (ANCEF) IVPB 1 g/50 mL premix     1 g 100 mL/hr over 30 Minutes Intravenous Every 8 hours 10/12/16 1458 10/13/16 0011   10/12/16 0643  ceFAZolin (ANCEF) 2-4 GM/100ML-% IVPB    Comments:  Chrystine Oiler: cabinet override      10/12/16 0643 10/12/16 0744   10/12/16 0519  ceFAZolin (ANCEF) IVPB 2g/100 mL premix     2 g 200 mL/hr over 30 Minutes Intravenous 30 min pre-op 10/12/16 0519 10/12/16 0744      Assessment/Plan:  Doing very well POD 1. Check JP Cr today, PT eval, Reg diet. We discussed goals for discharge and that resources such as HHPT or even brief inpatient rehab may be necessary pending his post-op functional status.   [facility], [name] 10/13/2016

## 2016-10-13 NOTE — Progress Notes (Signed)
UROLOGY PROGRESS NOTES  Assessment/Plan: Austin Jackson is a 83 y.o. male with a history of mild CHF, chronic a fib, CKD 3, GERD, BPH, nephrolithiasis, HTN, OA, right UTUC s/p right RAL nephroureterectomy on 10/12/16 with Dr. Tresa Moore.   Interval/Plan: Afebrile, tachycardic to 113, stable BP. 1.8 UOP. [place] Cr 1.45. Hb 10.0. JP Cr 1.5.   - Saline lock - Regular diet - Ambulate, PT consult (uses cane or RW at baseline) - Continue Foley catheter   Subjective: No n/v. Some confusion according to nursing staff. A&O this morning. Pain tolerable. Not OOB yet. Already passing flatus.  Objective:  Vital signs in last 24 hours: Temp:  [95.9 F (35.5 C)-98.7 F (37.1 C)] 98.4 F (36.9 C) (04/06 0500) Pulse Rate:  [81-113] 106 (04/06 0500) Resp:  [12-25] 19 (04/06 0500) BP: (122-162)/(65-91) 123/75 (04/06 0500) SpO2:  [95 %-100 %] 96 % (04/06 0500)  04/05 0701 - 04/06 0700 In: 4646.7 [I.V.:3761.7; Blood:335; IV Piggyback:550] Out: 0051 [Urine:1800; Drains:655; Blood:300]    Physical Exam:  General:  well-developed and well-nourished elderly male in NAD, lying in bed, alert & oriented, pleasant HEENT: [place]/AT, EOMI, sclera anicteric, hearing grossly intact, no nasal discharge, MMM Respiratory: nonlabored respirations, satting well on [place], symmetrical chest rise Cardiovascular: pulse regular rate & rhythm Abdominal: soft, appropriately TTP, nondistended, surgical incisions c/d/i without signs of exudate/erythema with moderate ecchymosi GU: Foley draining yellow light urine, JP with thin SS drainage Extremities: warm, well-perfused, no c/c/e   Data Review: Results for orders placed or performed during the hospital encounter of 10/12/16 (from the past 24 hour(s))  Prepare RBC     Status: None   Collection Time: 10/12/16 10:00 AM  Result Value Ref Range   Order Confirmation ORDER PROCESSED BY BLOOD BANK   Hemoglobin and hematocrit, blood     Status: Abnormal   Collection Time:  10/12/16  2:52 PM  Result Value Ref Range   Hemoglobin 10.4 (L) 13.0 - 17.0 g/dL   HCT 32.9 (L) 39.0 - 10.2 %  Basic metabolic panel     Status: Abnormal   Collection Time: 10/13/16  6:01 AM  Result Value Ref Range   Sodium 134 (L) 135 - 145 mmol/L   Potassium 5.2 (H) 3.5 - 5.1 mmol/L   Chloride 106 101 - 111 mmol/L   CO2 23 22 - 32 mmol/L   Glucose, Bld 179 (H) 65 - 99 mg/dL   BUN 37 (H) 6 - 20 mg/dL   Creatinine, Ser 1.45 (H) 0.61 - 1.24 mg/dL   Calcium 8.4 (L) 8.9 - 10.3 mg/dL   GFR calc non Af Amer 40 (L) >60 mL/min   GFR calc Af Amer 46 (L) >60 mL/min   Anion gap 5 5 - 15  Hemoglobin and hematocrit, blood     Status: Abnormal   Collection Time: 10/13/16  6:01 AM  Result Value Ref Range   Hemoglobin 10.0 (L) 13.0 - 17.0 g/dL   HCT 31.1 (L) 39.0 - 52.0 %    Imaging: none  I have seen and examined the patient and agree with Dr. Isabel Caprice assesment and plan.  Briefly,  S: No complaints, mild sundowning as per baseline that quickly reorients.  O: NAD, AOx3 Non-labored breathing, son at bedside SNTND, Jp serous Foley with clear urine  A/P: PT eval, adv diet, goals for DC discussed.

## 2016-10-14 LAB — HEMOGLOBIN AND HEMATOCRIT, BLOOD
HCT: 33.2 % — ABNORMAL LOW (ref 39.0–52.0)
HEMOGLOBIN: 10.5 g/dL — AB (ref 13.0–17.0)

## 2016-10-14 LAB — BASIC METABOLIC PANEL
ANION GAP: 5 (ref 5–15)
BUN: 43 mg/dL — ABNORMAL HIGH (ref 6–20)
CO2: 24 mmol/L (ref 22–32)
Calcium: 8.7 mg/dL — ABNORMAL LOW (ref 8.9–10.3)
Chloride: 104 mmol/L (ref 101–111)
Creatinine, Ser: 1.56 mg/dL — ABNORMAL HIGH (ref 0.61–1.24)
GFR calc non Af Amer: 36 mL/min — ABNORMAL LOW (ref >60)
GFR, EST AFRICAN AMERICAN: 42 mL/min — AB (ref >60)
Glucose, Bld: 154 mg/dL — ABNORMAL HIGH (ref 65–99)
POTASSIUM: 5 mmol/L (ref 3.5–5.1)
Sodium: 133 mmol/L — ABNORMAL LOW (ref 135–145)

## 2016-10-14 NOTE — Progress Notes (Signed)
Patient and his son given discharge, medication, and follow up instructions, verbalized understanding, IV x 2 removed, JP drain removed, dressing intact, Foley catheter intact and care instructions given, pt's son did not want leg bag, family to transport home.

## 2016-10-14 NOTE — Progress Notes (Signed)
UROLOGY PROGRESS NOTES  Assessment/Plan: Austin Jackson is a 83 y.o. male with a history of mild CHF, chronic a fib, CKD 3, GERD, BPH, nephrolithiasis, HTN, OA, right UTUC s/p right RAL nephroureterectomy on 10/12/16 with Dr. Tresa Moore.   Interval/Plan: Tachycardic yesterday but AFVSS overnight. Some sundowning per RN and son. 500 recorded UOP with another 250 in bag this morning. 200 from JP, 120 overnight. JP Cr = serum. Cr 1.56. Hb stable at 10.5.   - Ambulate, PT consult (uses cane or RW at baseline) - Continue Foley catheter - Discontinue JP drain prior to discharge - Anticipate discharge later today after PT evaluation    Subjective: Having more confusion today, didn't get much sleep overnight per son. Some sundowning. Eager to go home to own bed and sleep better.   Objective:  Vital signs in last 24 hours: Temp:  [97.5 F (36.4 C)-98.5 F (36.9 C)] 97.5 F (36.4 C) (04/07 0527) Pulse Rate:  [62-110] 62 (04/07 0527) Resp:  [18] 18 (04/07 0527) BP: (115-134)/(60-74) 125/74 (04/07 0527) SpO2:  [96 %-98 %] 98 % (04/07 0527)  04/06 0701 - 04/07 0700 In: -  Out: 700 [Urine:500; Drains:200]    Physical Exam:  General:  well-developed and well-nourished elderly male in NAD, lying in bed, alert & oriented, pleasant HEENT: [place]/AT, EOMI, sclera anicteric, hearing grossly intact, no nasal discharge, MMM Respiratory: nonlabored respirations, satting well on RA, symmetrical chest rise Cardiovascular: pulse regular rate & rhythm Abdominal: soft, appropriately TTP, nondistended, surgical incisions c/d/i without signs of exudate/erythema with moderate ecchymosis GU: Foley draining yellow light urine, JP with thin SS drainage Extremities: warm, well-perfused, no c/c/e   Data Review: Results for orders placed or performed during the hospital encounter of 10/12/16 (from the past 24 hour(s))  Hemoglobin and hematocrit, blood     Status: Abnormal   Collection Time: 10/14/16  5:21 AM   Result Value Ref Range   Hemoglobin 10.5 (L) 13.0 - 17.0 g/dL   HCT 33.2 (L) 39.0 - 54.6 %  Basic metabolic panel     Status: Abnormal   Collection Time: 10/14/16  5:21 AM  Result Value Ref Range   Sodium 133 (L) 135 - 145 mmol/L   Potassium 5.0 3.5 - 5.1 mmol/L   Chloride 104 101 - 111 mmol/L   CO2 24 22 - 32 mmol/L   Glucose, Bld 154 (H) 65 - 99 mg/dL   BUN 43 (H) 6 - 20 mg/dL   Creatinine, Ser 1.56 (H) 0.61 - 1.24 mg/dL   Calcium 8.7 (L) 8.9 - 10.3 mg/dL   GFR calc non Af Amer 36 (L) >60 mL/min   GFR calc Af Amer 42 (L) >60 mL/min   Anion gap 5 5 - 15    Imaging: none

## 2016-10-14 NOTE — Evaluation (Addendum)
Physical Therapy Evaluation Patient Details Name: Austin Jackson MRN: [id number] DOB: September 26, 1919 Today's Date: 10/14/2016   History of Present Illness  83 y.o. male with a history of mild CHF, chronic a fib, CKD 3, GERD, BPH, nephrolithiasis, HTN, OA, macular degeneration and right UTUC s/p right RAL nephroureterectomy on 10/12/16   Clinical Impression  Patient evaluated by Physical Therapy with no further acute PT needs identified. All education has been completed and the patient has no further questions.  Pt resistant to mobilizing at first however son encouraged pt to participate.  Son reports pt sundowned last night and now pt just wants to d/c home.  Son states he will assist pt at home upon d/c and his brother will switch off with caregiving thereafter. Son also reports pt received HHPT after previous hospitalization and no major improvements were observed, so he declines HHPT at this time.  Son encouraged to assist pt with safely ambulating at home.  PT is signing off. Thank you for this referral.     Follow Up Recommendations No PT follow up (son plan to assist pt with mobilizing at home, politely decined HHPT)    Equipment Recommendations  None recommended by PT    Recommendations for Other Services       Precautions / Restrictions Precautions Precautions: Fall Precaution Comments: R JP drain      Mobility  Bed Mobility Overal bed mobility: Needs Assistance Bed Mobility: Supine to Sit     Supine to sit: Min assist     General bed mobility comments: son assist pt's trunk as pt a little resistant to mobilize  Transfers Overall transfer level: Needs assistance Equipment used: Rolling walker (2 wheeled) Transfers: Sit to/from Stand Sit to Stand: Min assist         General transfer comment: assist to rise and steady, pt held onto [employer]  Ambulation/Gait Ambulation/Gait assistance: Min assist Ambulation Distance (Feet): 120 Feet Assistive device: Rolling walker (2  wheeled) Gait Pattern/deviations: Step-through pattern;Decreased stride length;Trunk flexed     General Gait Details: max cues and assist for RW distance (tends to keep too far forward)  Science writer    Modified Rankin (Stroke Patients Only)       Balance Overall balance assessment: Needs assistance         Standing balance support: Bilateral upper extremity supported Standing balance-Leahy Scale: Poor Standing balance comment: requires UE support, son reports no known falls                             Pertinent Vitals/Pain Pain Assessment: No/denies pain    Home Living Family/patient expects to be discharged to:: Private residence Living Arrangements: Spouse/significant other Available Help at Discharge: Family;Available 24 hours/day Type of Home: House       Home Layout: One level Home Equipment: Cane - single point;Walker - 2 wheels Additional Comments: son reports he and his brother are taking turns staying with pt for a week upon d/c    Prior Function Level of Independence: Independent with assistive device(s)         Comments: cane or RW     Hand Dominance        Extremity/Trunk Assessment        Lower Extremity Assessment Lower Extremity Assessment: Generalized weakness    Cervical / Trunk Assessment Cervical / Trunk Assessment: Kyphotic  Communication   Communication:  No difficulties  Cognition Arousal/Alertness: Awake/alert Behavior During Therapy: Agitated Overall Cognitive Status: Impaired/Different from baseline                                 General Comments: son reports pt did not sleep well last night due to sundowning, pt slightly agitated however son provided cues for pt to mobilize      General Comments      Exercises     Assessment/Plan    PT Assessment Patent does not need any further PT services  PT Problem List         PT Treatment Interventions       PT Goals (Current goals can be found in the Care Plan section)  Acute Rehab PT Goals PT Goal Formulation: All assessment and education complete, DC therapy    Frequency     Barriers to discharge        Co-evaluation               End of Session Equipment Utilized During Treatment: Gait belt Activity Tolerance: Patient limited by fatigue;Other (comment) (limited by cognition, sundowned last night) Patient left: in chair;with call bell/phone within reach;with family/visitor present Nurse Communication: Mobility status PT Visit Diagnosis: Unsteadiness on feet (R26.81)    Time: 3428-7681 PT Time Calculation (min) (ACUTE ONLY): 13 min   Charges:   PT Evaluation $PT Eval Low Complexity: 1 Procedure     PT G Codes:       Carmelia Bake, PT, DPT 10/14/2016 Pager: [phone]   York Ram E 10/14/2016, 11:47 AM Carmelia Bake, PT, DPT 10/14/2016 Pager: [phone]

## 2016-10-16 LAB — TYPE AND SCREEN
ABO/RH(D): A POS
ANTIBODY SCREEN: NEGATIVE
UNIT DIVISION: 0
Unit division: 0

## 2016-10-16 LAB — BPAM RBC
BLOOD PRODUCT EXPIRATION DATE: 201804232359
Blood Product Expiration Date: 201804242359
ISSUE DATE / TIME: 201804050920
ISSUE DATE / TIME: 201804050920
UNIT TYPE AND RH: 6200
Unit Type and Rh: 6200

## 2016-10-23 DIAGNOSIS — C651 Malignant neoplasm of right renal pelvis: Secondary | ICD-10-CM | POA: Diagnosis not present

## 2016-11-03 ENCOUNTER — Other Ambulatory Visit: Payer: Self-pay | Admitting: Urology

## 2016-12-05 DIAGNOSIS — D508 Other iron deficiency anemias: Secondary | ICD-10-CM | POA: Diagnosis not present

## 2016-12-05 DIAGNOSIS — N182 Chronic kidney disease, stage 2 (mild): Secondary | ICD-10-CM | POA: Diagnosis not present

## 2016-12-05 DIAGNOSIS — H4089 Other specified glaucoma: Secondary | ICD-10-CM | POA: Diagnosis not present

## 2016-12-05 DIAGNOSIS — I5032 Chronic diastolic (congestive) heart failure: Secondary | ICD-10-CM | POA: Diagnosis not present

## 2016-12-05 DIAGNOSIS — N400 Enlarged prostate without lower urinary tract symptoms: Secondary | ICD-10-CM | POA: Diagnosis not present

## 2016-12-05 DIAGNOSIS — I481 Persistent atrial fibrillation: Secondary | ICD-10-CM | POA: Diagnosis not present

## 2016-12-05 DIAGNOSIS — E038 Other specified hypothyroidism: Secondary | ICD-10-CM | POA: Diagnosis not present

## 2016-12-11 NOTE — Addendum Note (Signed)
Addendum  created 12/11/16 1215 by Elzie Knisley, MD   Sign clinical note    

## 2016-12-14 DIAGNOSIS — H353190 Nonexudative age-related macular degeneration, unspecified eye, stage unspecified: Secondary | ICD-10-CM | POA: Diagnosis not present

## 2016-12-14 DIAGNOSIS — H401233 Low-tension glaucoma, bilateral, severe stage: Secondary | ICD-10-CM | POA: Diagnosis not present

## 2017-01-09 DIAGNOSIS — R8279 Other abnormal findings on microbiological examination of urine: Secondary | ICD-10-CM | POA: Diagnosis not present

## 2017-01-09 DIAGNOSIS — C672 Malignant neoplasm of lateral wall of bladder: Secondary | ICD-10-CM | POA: Diagnosis not present

## 2017-01-09 DIAGNOSIS — C651 Malignant neoplasm of right renal pelvis: Secondary | ICD-10-CM | POA: Diagnosis not present

## 2017-01-09 DIAGNOSIS — N134 Hydroureter: Secondary | ICD-10-CM | POA: Diagnosis not present

## 2017-01-15 NOTE — Patient Instructions (Addendum)
Reeves Musick Arey  01/15/2017   Your procedure is scheduled on: 01-24-17  Report to [facility] Main  Entrance Take [place]  Elevators to 3rd floor to  [place] at 6:30 AM.   Call this number if you have problems the morning of surgery [phone]    Remember: ONLY 1 PERSON MAY GO WITH YOU TO SHORT STAY TO GET  READY MORNING OF [place].  Do not eat food or drink liquids :After Midnight.     Take these medicines the morning of surgery with A SIP OF WATER: Metoprolol (Lopressor). You may also bring and use your eyedrop                                You may not have any metal on your body including hair pins and              piercings  Do not wear jewelry, lotions, powders, deodorant             .              Men may shave face and neck.   Do not bring valuables to the hospital. [place].  Contacts, dentures or bridgework may not be worn into surgery.     Patients discharged the day of surgery will not be allowed to drive home.  Name and phone number of your driver: Damier Disano [phone]              Please read over the following fact sheets you were given: _____________________________________________________________________             [facility] - Preparing for Surgery Before surgery, you can play an important role.  Because skin is not sterile, your skin needs to be as free of germs as possible.  You can reduce the number of germs on your skin by washing with CHG (chlorahexidine gluconate) soap before surgery.  CHG is an antiseptic cleaner which kills germs and bonds with the skin to continue killing germs even after washing. Please DO NOT use if you have an allergy to CHG or antibacterial soaps.  If your skin becomes reddened/irritated stop using the CHG and inform your nurse when you arrive at Short Stay. Do not shave (including legs and underarms) for at least 48 hours prior to the first  CHG shower.  You may shave your face/neck. Please follow these instructions carefully:  1.  Shower with CHG Soap the night before surgery and the  morning of Surgery.  2.  If you choose to wash your hair, wash your hair first as usual with your  normal  shampoo.  3.  After you shampoo, rinse your hair and body thoroughly to remove the  shampoo.                           4.  Use CHG as you would any other liquid soap.  You can apply chg directly  to the skin and wash                       Gently with a scrungie or clean washcloth.  5.  Apply  the CHG Soap to your body ONLY FROM THE NECK DOWN.   Do not use on face/ open                           Wound or open sores. Avoid contact with eyes, ears mouth and genitals (private parts).                       Wash face,  Genitals (private parts) with your normal soap.             6.  Wash thoroughly, paying special attention to the area where your surgery  will be performed.  7.  Thoroughly rinse your body with warm water from the neck down.  8.  DO NOT shower/wash with your normal soap after using and rinsing off  the CHG Soap.                9.  Pat yourself dry with a clean towel.            10.  Wear clean pajamas.            11.  Place clean sheets on your bed the night of your first shower and do not  sleep with pets. Day of Surgery : Do not apply any lotions/deodorants the morning of surgery.  Please wear clean clothes to the hospital/surgery center.  FAILURE TO FOLLOW THESE INSTRUCTIONS MAY RESULT IN THE CANCELLATION OF YOUR SURGERY PATIENT SIGNATURE_________________________________  NURSE SIGNATURE__________________________________  ________________________________________________________________________

## 2017-01-15 NOTE — Progress Notes (Addendum)
07-19-16 (EPIC) EKG,  07-21-16 (EPIC) CXR

## 2017-01-16 ENCOUNTER — Encounter (HOSPITAL_COMMUNITY): Payer: Self-pay

## 2017-01-16 ENCOUNTER — Encounter (HOSPITAL_COMMUNITY)
Admission: RE | Admit: 2017-01-16 | Discharge: 2017-01-16 | Disposition: A | Discharge status: Home / Self Care | Payer: Medicare Other | Source: Ambulatory Visit | Attending: Urology | Admitting: Urology

## 2017-01-16 ENCOUNTER — Encounter (INDEPENDENT_AMBULATORY_CARE_PROVIDER_SITE_OTHER): Payer: Self-pay

## 2017-01-16 DIAGNOSIS — Z8551 Personal history of malignant neoplasm of bladder: Secondary | ICD-10-CM | POA: Insufficient documentation

## 2017-01-16 DIAGNOSIS — Z01812 Encounter for preprocedural laboratory examination: Secondary | ICD-10-CM | POA: Insufficient documentation

## 2017-01-16 DIAGNOSIS — N135 Crossing vessel and stricture of ureter without hydronephrosis: Secondary | ICD-10-CM | POA: Diagnosis not present

## 2017-01-16 LAB — BASIC METABOLIC PANEL
Anion gap: 9 (ref 5–15)
BUN: 43 mg/dL — AB (ref 6–20)
CALCIUM: 9.2 mg/dL (ref 8.9–10.3)
CHLORIDE: 100 mmol/L — AB (ref 101–111)
CO2: 28 mmol/L (ref 22–32)
CREATININE: 1.98 mg/dL — AB (ref 0.61–1.24)
GFR calc Af Amer: 31 mL/min — ABNORMAL LOW (ref >60)
GFR calc non Af Amer: 27 mL/min — ABNORMAL LOW (ref >60)
Glucose, Bld: 113 mg/dL — ABNORMAL HIGH (ref 65–99)
Potassium: 4.5 mmol/L (ref 3.5–5.1)
SODIUM: 137 mmol/L (ref 135–145)

## 2017-01-16 LAB — CBC
HCT: 35.7 % — ABNORMAL LOW (ref 39.0–52.0)
Hemoglobin: 11.6 g/dL — ABNORMAL LOW (ref 13.0–17.0)
MCH: 30.6 pg (ref 26.0–34.0)
MCHC: 32.5 g/dL (ref 30.0–36.0)
MCV: 94.2 fL (ref 78.0–100.0)
PLATELETS: 190 10*3/uL (ref 150–400)
RBC: 3.79 MIL/uL — ABNORMAL LOW (ref 4.22–5.81)
RDW: 17.1 % — AB (ref 11.5–15.5)
WBC: 4.7 10*3/uL (ref 4.0–10.5)

## 2017-01-16 NOTE — Progress Notes (Signed)
01-16-17 BMP result, routed to Dr. Tresa Moore for review.

## 2017-01-16 NOTE — Progress Notes (Signed)
01-16-17  Pt son reported that pt had recently experienced  increased edema in bilateral lower extremities. As a result, MD had increased lasix, as his MD does from time to time, when this occurs.

## 2017-01-19 NOTE — Addendum Note (Signed)
Addendum  created 01/19/17 0902 by Nolon Nations, MD   Sign clinical note

## 2017-01-19 NOTE — Anesthesia Postprocedure Evaluation (Signed)
Anesthesia Post Note  Patient: Austin Jackson  Procedure(s) Performed: Procedure(s) (LRB): CYSTOSCOPY WITH RETROGRADE PYELOGRAM,  AND STENT PLACEMENT (Left)     Anesthesia Post Evaluation  Last Vitals:  Vitals:   07/22/16 1959 07/23/16 0651  BP: 118/84 137/86  Pulse: 71 89  Resp: 18 19  Temp: 36.6 C 36.9 C    Last Pain:  Vitals:   07/23/16 0800  TempSrc:   PainSc: 0-No pain                 Nolon Nations

## 2017-01-23 NOTE — Anesthesia Preprocedure Evaluation (Addendum)
Anesthesia Evaluation  Patient identified by MRN, date of birth, ID band Patient awake    Reviewed: Allergy & Precautions, NPO status , Patient's Chart, lab work & pertinent test results  History of Anesthesia Complications Negative for: history of anesthetic complications  Airway Mallampati: I  TM Distance: >3 FB Neck ROM: Full    Dental  (+) Dental Advisory Given   Pulmonary COPD, former smoker,    breath sounds clear to auscultation       Cardiovascular hypertension, Pt. on medications and Pt. on home beta blockers (-) angina+ dysrhythmias Atrial Fibrillation  Rhythm:Irregular Rate:Normal     Neuro/Psych negative neurological ROS     GI/Hepatic Neg liver ROS, GERD  Controlled,  Endo/Other  Hypothyroidism   Renal/GU Renal InsufficiencyRenal disease (creat 1.63)     Musculoskeletal  (+) Arthritis ,   Abdominal   Peds  Hematology  (+) anemia , Hb 11.6   Anesthesia Other Findings Afib from 07-18-16 ekg  Reproductive/Obstetrics                            Anesthesia Physical  Anesthesia Plan  ASA: II  Anesthesia Plan: General   Post-op Pain Management:    Induction: Intravenous  PONV Risk Score and Plan:   Airway Management Planned: Oral ETT  Additional Equipment:   Intra-op Plan:   Post-operative Plan: Extubation in OR  Informed Consent: I have reviewed the patients History and Physical, chart, labs and discussed the procedure including the risks, benefits and alternatives for the proposed anesthesia with the patient or authorized representative who has indicated his/her understanding and acceptance.   Dental advisory given  Plan Discussed with: CRNA and Surgeon  Anesthesia Plan Comments: (  )        Anesthesia Quick Evaluation

## 2017-01-24 ENCOUNTER — Encounter (HOSPITAL_COMMUNITY): Payer: Self-pay | Admitting: Anesthesiology

## 2017-01-24 ENCOUNTER — Ambulatory Visit (HOSPITAL_COMMUNITY): Payer: Medicare Other | Admitting: Anesthesiology

## 2017-01-24 ENCOUNTER — Ambulatory Visit (HOSPITAL_COMMUNITY)
Admission: RE | Admit: 2017-01-24 | Discharge: 2017-01-24 | Disposition: A | Discharge status: Home / Self Care | Payer: Medicare Other | Source: Ambulatory Visit | Attending: Urology | Admitting: Urology

## 2017-01-24 ENCOUNTER — Ambulatory Visit (HOSPITAL_COMMUNITY): Payer: Medicare Other

## 2017-01-24 ENCOUNTER — Encounter (HOSPITAL_COMMUNITY)
Admission: RE | Disposition: A | Discharge status: Home / Self Care | Payer: Self-pay | Source: Ambulatory Visit | Attending: Urology

## 2017-01-24 DIAGNOSIS — K219 Gastro-esophageal reflux disease without esophagitis: Secondary | ICD-10-CM | POA: Diagnosis not present

## 2017-01-24 DIAGNOSIS — H409 Unspecified glaucoma: Secondary | ICD-10-CM | POA: Diagnosis not present

## 2017-01-24 DIAGNOSIS — I130 Hypertensive heart and chronic kidney disease with heart failure and stage 1 through stage 4 chronic kidney disease, or unspecified chronic kidney disease: Secondary | ICD-10-CM | POA: Insufficient documentation

## 2017-01-24 DIAGNOSIS — Z466 Encounter for fitting and adjustment of urinary device: Secondary | ICD-10-CM | POA: Diagnosis not present

## 2017-01-24 DIAGNOSIS — Z8554 Personal history of malignant neoplasm of ureter: Secondary | ICD-10-CM | POA: Insufficient documentation

## 2017-01-24 DIAGNOSIS — Z87891 Personal history of nicotine dependence: Secondary | ICD-10-CM | POA: Insufficient documentation

## 2017-01-24 DIAGNOSIS — I10 Essential (primary) hypertension: Secondary | ICD-10-CM | POA: Diagnosis not present

## 2017-01-24 DIAGNOSIS — Z885 Allergy status to narcotic agent status: Secondary | ICD-10-CM | POA: Diagnosis not present

## 2017-01-24 DIAGNOSIS — N261 Atrophy of kidney (terminal): Secondary | ICD-10-CM | POA: Insufficient documentation

## 2017-01-24 DIAGNOSIS — M1990 Unspecified osteoarthritis, unspecified site: Secondary | ICD-10-CM | POA: Diagnosis not present

## 2017-01-24 DIAGNOSIS — N183 Chronic kidney disease, stage 3 (moderate): Secondary | ICD-10-CM | POA: Diagnosis not present

## 2017-01-24 DIAGNOSIS — Z79899 Other long term (current) drug therapy: Secondary | ICD-10-CM | POA: Diagnosis not present

## 2017-01-24 DIAGNOSIS — E032 Hypothyroidism due to medicaments and other exogenous substances: Secondary | ICD-10-CM | POA: Insufficient documentation

## 2017-01-24 DIAGNOSIS — Z905 Acquired absence of kidney: Secondary | ICD-10-CM | POA: Diagnosis not present

## 2017-01-24 DIAGNOSIS — N1330 Unspecified hydronephrosis: Secondary | ICD-10-CM | POA: Diagnosis not present

## 2017-01-24 DIAGNOSIS — J449 Chronic obstructive pulmonary disease, unspecified: Secondary | ICD-10-CM | POA: Insufficient documentation

## 2017-01-24 DIAGNOSIS — I509 Heart failure, unspecified: Secondary | ICD-10-CM | POA: Diagnosis not present

## 2017-01-24 DIAGNOSIS — H3530 Unspecified macular degeneration: Secondary | ICD-10-CM | POA: Diagnosis not present

## 2017-01-24 DIAGNOSIS — N400 Enlarged prostate without lower urinary tract symptoms: Secondary | ICD-10-CM | POA: Diagnosis not present

## 2017-01-24 DIAGNOSIS — Z8551 Personal history of malignant neoplasm of bladder: Secondary | ICD-10-CM | POA: Insufficient documentation

## 2017-01-24 DIAGNOSIS — I482 Chronic atrial fibrillation: Secondary | ICD-10-CM | POA: Diagnosis not present

## 2017-01-24 DIAGNOSIS — N135 Crossing vessel and stricture of ureter without hydronephrosis: Secondary | ICD-10-CM | POA: Diagnosis not present

## 2017-01-24 DIAGNOSIS — N358 Other urethral stricture: Secondary | ICD-10-CM | POA: Diagnosis not present

## 2017-01-24 HISTORY — PX: CYSTOSCOPY WITH RETROGRADE PYELOGRAM, URETEROSCOPY AND STENT PLACEMENT: SHX5789

## 2017-01-24 SURGERY — CYSTOURETEROSCOPY, WITH RETROGRADE PYELOGRAM AND STENT INSERTION
Anesthesia: General

## 2017-01-24 MED ORDER — DEXTROSE 5 % IV SOLN
2.0000 g | INTRAVENOUS | Status: AC
  Administered 2017-01-24: 2 g via INTRAVENOUS

## 2017-01-24 MED ORDER — PHENYLEPHRINE 40 MCG/ML (10ML) SYRINGE FOR IV PUSH (FOR BLOOD PRESSURE SUPPORT)
PREFILLED_SYRINGE | INTRAVENOUS | Status: DC | PRN
  Administered 2017-01-24: 40 ug via INTRAVENOUS
  Administered 2017-01-24: 80 ug via INTRAVENOUS
  Administered 2017-01-24: 40 ug via INTRAVENOUS

## 2017-01-24 MED ORDER — FENTANYL CITRATE (PF) 100 MCG/2ML IJ SOLN
INTRAMUSCULAR | Status: DC | PRN
  Administered 2017-01-24: 50 ug via INTRAVENOUS

## 2017-01-24 MED ORDER — EPHEDRINE SULFATE-NACL 50-0.9 MG/10ML-% IV SOSY
PREFILLED_SYRINGE | INTRAVENOUS | Status: DC | PRN
  Administered 2017-01-24: 5 mg via INTRAVENOUS

## 2017-01-24 MED ORDER — DEXAMETHASONE SODIUM PHOSPHATE 10 MG/ML IJ SOLN
INTRAMUSCULAR | Status: DC | PRN
  Administered 2017-01-24: 10 mg via INTRAVENOUS

## 2017-01-24 MED ORDER — IOHEXOL 300 MG/ML  SOLN
INTRAMUSCULAR | Status: DC | PRN
  Administered 2017-01-24: 10 mL

## 2017-01-24 MED ORDER — SUCCINYLCHOLINE CHLORIDE 200 MG/10ML IV SOSY
PREFILLED_SYRINGE | INTRAVENOUS | Status: AC
  Filled 2017-01-24: qty 10

## 2017-01-24 MED ORDER — PROPOFOL 10 MG/ML IV BOLUS
INTRAVENOUS | Status: DC | PRN
  Administered 2017-01-24: 100 mg via INTRAVENOUS

## 2017-01-24 MED ORDER — LACTATED RINGERS IV SOLN
INTRAVENOUS | Status: DC | PRN
  Administered 2017-01-24 (×2): via INTRAVENOUS

## 2017-01-24 MED ORDER — EPHEDRINE 5 MG/ML INJ
INTRAVENOUS | Status: AC
  Filled 2017-01-24: qty 10

## 2017-01-24 MED ORDER — DEXTROSE 5 % IV SOLN
INTRAVENOUS | Status: AC
  Filled 2017-01-24: qty 2

## 2017-01-24 MED ORDER — LIDOCAINE 2% (20 MG/ML) 5 ML SYRINGE
INTRAMUSCULAR | Status: DC | PRN
  Administered 2017-01-24: 70 mg via INTRAVENOUS

## 2017-01-24 MED ORDER — LIDOCAINE 2% (20 MG/ML) 5 ML SYRINGE
INTRAMUSCULAR | Status: AC
  Filled 2017-01-24: qty 5

## 2017-01-24 MED ORDER — ONDANSETRON HCL 4 MG/2ML IJ SOLN
INTRAMUSCULAR | Status: DC | PRN
  Administered 2017-01-24: 4 mg via INTRAVENOUS

## 2017-01-24 MED ORDER — ROCURONIUM BROMIDE 50 MG/5ML IV SOSY
PREFILLED_SYRINGE | INTRAVENOUS | Status: AC
  Filled 2017-01-24: qty 5

## 2017-01-24 MED ORDER — DEXAMETHASONE SODIUM PHOSPHATE 10 MG/ML IJ SOLN
INTRAMUSCULAR | Status: AC
  Filled 2017-01-24: qty 1

## 2017-01-24 MED ORDER — SODIUM CHLORIDE 0.9 % IR SOLN
Status: DC | PRN
  Administered 2017-01-24: 3000 mL

## 2017-01-24 MED ORDER — FENTANYL CITRATE (PF) 100 MCG/2ML IJ SOLN
INTRAMUSCULAR | Status: AC
  Filled 2017-01-24: qty 2

## 2017-01-24 MED ORDER — PHENYLEPHRINE 40 MCG/ML (10ML) SYRINGE FOR IV PUSH (FOR BLOOD PRESSURE SUPPORT)
PREFILLED_SYRINGE | INTRAVENOUS | Status: AC
  Filled 2017-01-24: qty 10

## 2017-01-24 MED ORDER — FENTANYL CITRATE (PF) 100 MCG/2ML IJ SOLN: 25.0000 ug | INTRAMUSCULAR | Status: DC | PRN

## 2017-01-24 MED ORDER — PROPOFOL 10 MG/ML IV BOLUS
INTRAVENOUS | Status: AC
  Filled 2017-01-24: qty 20

## 2017-01-24 MED ORDER — ONDANSETRON HCL 4 MG/2ML IJ SOLN
INTRAMUSCULAR | Status: AC
  Filled 2017-01-24: qty 2

## 2017-01-24 SURGICAL SUPPLY — 31 items
BAG URINE DRAINAGE (UROLOGICAL SUPPLIES) IMPLANT
BAG URO CATCHER STRL LF (MISCELLANEOUS) ×4 IMPLANT
BASKET LASER NITINOL 1.9FR (BASKET) IMPLANT
CATH INTERMIT  6FR 70CM (CATHETERS) ×4 IMPLANT
CLOTH BEACON ORANGE TIMEOUT ST (SAFETY) ×4 IMPLANT
COVER SURGICAL LIGHT HANDLE (MISCELLANEOUS) IMPLANT
ELECT REM PT RETURN 15FT ADLT (MISCELLANEOUS) IMPLANT
EVACUATOR MICROVAS BLADDER (UROLOGICAL SUPPLIES) IMPLANT
FIBER LASER FLEXIVA 1000 (UROLOGICAL SUPPLIES) IMPLANT
FIBER LASER FLEXIVA 365 (UROLOGICAL SUPPLIES) IMPLANT
FIBER LASER FLEXIVA 550 (UROLOGICAL SUPPLIES) IMPLANT
FIBER LASER TRAC TIP (UROLOGICAL SUPPLIES) IMPLANT
GLOVE BIOGEL M STRL SZ7.5 (GLOVE) ×4 IMPLANT
GOWN STRL REUS W/TWL LRG LVL3 (GOWN DISPOSABLE) ×8 IMPLANT
GUIDEWIRE ANG ZIPWIRE 038X150 (WIRE) ×4 IMPLANT
GUIDEWIRE STR DUAL SENSOR (WIRE) ×4 IMPLANT
IV NS 1000ML (IV SOLUTION)
IV NS 1000ML BAXH (IV SOLUTION) IMPLANT
LOOP CUT BIPOLAR 24F LRG (ELECTROSURGICAL) IMPLANT
MANIFOLD NEPTUNE II (INSTRUMENTS) ×4 IMPLANT
PACK CYSTO (CUSTOM PROCEDURE TRAY) ×4 IMPLANT
SET ASPIRATION TUBING (TUBING) IMPLANT
SHEATH ACCESS URETERAL 24CM (SHEATH) IMPLANT
SHEATH ACCESS URETERAL 38CM (SHEATH) IMPLANT
SHEATH ACCESS URETERAL 54CM (SHEATH) IMPLANT
STENT CONTOUR 6FRX26X.038 (STENTS) ×4 IMPLANT
SYR CONTROL 10ML LL (SYRINGE) IMPLANT
SYRINGE IRR TOOMEY STRL 70CC (SYRINGE) IMPLANT
TUBE FEEDING 8FR 16IN STR KANG (MISCELLANEOUS) IMPLANT
TUBING CONNECTING 10 (TUBING) ×3 IMPLANT
TUBING CONNECTING 10' (TUBING) ×1

## 2017-01-24 NOTE — Anesthesia Postprocedure Evaluation (Signed)
Anesthesia Post Note  Patient: Austin Jackson  Procedure(s) Performed: Procedure(s) (LRB): CYSTOSCOPY WITH RETROGRADE PYELOGRAM, AND STENT REPLACEMENT (Left)     Patient location during evaluation: PACU Anesthesia Type: General Level of consciousness: awake and alert Pain management: pain level controlled Vital Signs Assessment: post-procedure vital signs reviewed and stable Respiratory status: spontaneous breathing, nonlabored ventilation, respiratory function stable and patient connected to nasal cannula oxygen Cardiovascular status: blood pressure returned to baseline and stable Postop Assessment: no signs of nausea or vomiting Anesthetic complications: no    Last Vitals:  Vitals:   01/24/17 0959 01/24/17 1024  BP: 130/73 122/73  Pulse: 70 88  Resp: 16   Temp: (!) 36.4 C     Last Pain:  Vitals:   01/24/17 1007  TempSrc:   PainSc: 1                  [name] EDWARD

## 2017-01-24 NOTE — Anesthesia Procedure Notes (Signed)
Procedure Name: LMA Insertion Date/Time: 01/24/2017 8:31 AM Performed by: Shelisha Gautier, Virgel Gess Pre-anesthesia Checklist: Patient identified, Emergency Drugs available, Suction available and Patient being monitored Patient Re-evaluated:Patient Re-evaluated prior to induction Oxygen Delivery Method: Circle System Utilized Preoxygenation: Pre-oxygenation with 100% oxygen Induction Type: IV induction Ventilation: Mask ventilation without difficulty LMA: LMA inserted LMA Size: 4.0 Number of attempts: 1 Airway Equipment and Method: Bite block Placement Confirmation: positive ETCO2 Tube secured with: Tape Dental Injury: Teeth and Oropharynx as per pre-operative assessment

## 2017-01-24 NOTE — Brief Op Note (Signed)
01/24/2017  8:52 AM  PATIENT:  Austin Jackson  83 y.o. male  PRE-OPERATIVE DIAGNOSIS:  LEFT URETERAL STRICTURE, HISTORY OF BLADDER CANCER  POST-OPERATIVE DIAGNOSIS:  LEFT URETERAL STRICTURE, HISTORY OF BLADDER CANCER  PROCEDURE:  Procedure(s): CYSTOSCOPY WITH RETROGRADE PYELOGRAM, AND STENT REPLACEMENT (Left)  SURGEON:  Surgeon(s) and Role:    * [name], MD - Primary  PHYSICIAN ASSISTANT:   ASSISTANTS: none   ANESTHESIA:   general  EBL:  Total I/O In: 500 [I.V.:500] Out: 1 [Blood:1]  BLOOD ADMINISTERED:none  DRAINS: none   LOCAL MEDICATIONS USED:  NONE  SPECIMEN:  No Specimen  DISPOSITION OF SPECIMEN:  N/A  COUNTS:  YES  TOURNIQUET:  * No tourniquets in log *  DICTATION: .Other Dictation: Dictation Number [id number]  PLAN OF CARE: Discharge to home after PACU  PATIENT DISPOSITION:  PACU - hemodynamically stable.   Delay start of Pharmacological VTE agent (>24hrs) due to surgical blood loss or risk of bleeding: yes

## 2017-01-24 NOTE — H&P (Signed)
Austin Jackson is an 83 y.o. male.    Chief Complaint: Pre-op Cyso, LEFT ureteral stent change, possible TURBT  HPI:   1 - Right Renal Pelvis / Ureteral Cancer in Atrophic Right Kidney - s/p right nephro-ureterectomy 10/2016 for large volume pT1NxMx high grade cancer with NEGATIVE margins.    2 - Solitary Left kidney / Stage 3 Renal Insufficiency /LEFT ureteral stricture - s/p right nephrectomy 10/2016. Most recent Cr 1.5 post-op. Known LEFT ureteral stricture at site of prior bladder tumor managed with Q73mo stent changes, most recently placed 07/2016.   PMH sig for AFib (not anticaogulated), mild CHF/lasix (still able to walk [facility]n [facility]), gloucoma. Bilateral inguinal hernia repair. His son Austin Jackson is ER MD in [facility] and available at [phone]. His PCP is Dr. Eula Fried in [place].   Today " Austin Jackson " is seen to proceed with cysto, left ureteral stent exchange, possible TURBT, possibe ureteroscopy. No interval fevers.    Past Medical History:  Diagnosis Date  . Bilateral lower extremity edema   . Bladder cancer ([place])   . CHF (congestive heart failure) (H[facility])    mild  . Chronic atrial fibrillation ([place])    dx 2002--  s/p cardioversion approx 2006 or 2007-- per pt last cardiologist visit at that time-- currently be followed by pcp dr Theodis Blaze in [place]  . CKD (chronic kidney disease), stage III   . Dysrhythmia   . Full dentures   . GERD (gastroesophageal reflux disease)   . Glaucoma, both eyes   . Gross hematuria    intermittent  . History of BPH   . History of GI bleed    2013--  per pt unable to find reason but had been on blood thinner,  was transfusion's  . History of kidney stones   . Hypertension   . Hypothyroidism due to amiodarone   . Iron deficiency anemia   . Macular degeneration of both eyes   . OA (osteoarthritis)   . PONV (postoperative nausea and vomiting)   . Renal mass, right    renal pelvis    Past Surgical History:  Procedure Laterality Date   . CARDIOVERSION  2002  . COLONOSCOPY  yrs ago  . CYSTOSCOPY WITH RETROGRADE PYELOGRAM, URETEROSCOPY AND STENT PLACEMENT Left 07/19/2016   Procedure: CYSTOSCOPY WITH RETROGRADE PYELOGRAM,  AND STENT PLACEMENT;  Surgeon: [name], MD;  Location: [facility];  Service: Urology;  Laterality: Left;  . CYSTOSCOPY/RETROGRADE/URETEROSCOPY Bilateral 09/15/2016   Procedure: CYSTOSCOPY/ RETROGRADE/DIAGNOSITC URETEROSCOPY/URETERAL STENT EXCHANGE;  Surgeon: [name], MD;  Location: [facility];  Service: Urology;  Laterality: Bilateral;  . EXTRACORPOREAL SHOCK WAVE LITHOTRIPSY  1970's  . EYE SURGERY Left    for glaucoma  . EYE SURGERY Bilateral    cataract extraction with IOL  . INGUINAL HERNIA REPAIR Bilateral 2007 and 1980's  . ROBOT ASSITED LAPAROSCOPIC NEPHROURETERECTOMY Right 10/12/2016   Procedure: XI ROBOT ASSITED LAPAROSCOPIC NEPHROURETERECTOMY;  Surgeon: [name], MD;  Location: [facility];  Service: Urology;  Laterality: Right;  . TRANSURETHRAL RESECTION OF BLADDER TUMOR Right 07/12/2016   Procedure: TRANSURETHRAL RESECTION OF BLADDER TUMOR (TURBT)/ BILATERAL RETROGRADE PYLEOGRAM/ RIGHT DIAGNOSTIC URETEROSCOPY;  Surgeon: [name], MD;  Location: [facility];  Service: Urology;  Laterality: Right;  . TRANSURETHRAL RESECTION OF PROSTATE  2002    Family History  Problem Relation Age of Onset  . Kidney cancer Neg Hx   . Diabetes Mellitus II Neg Hx   . CAD Neg Hx    Social History:  reports that he quit smoking about 32 years ago. His smoking use included Cigarettes. He has a 60.00 pack-year smoking history. He has never used smokeless tobacco. He reports that he drinks alcohol. He reports that he does not use drugs.  Allergies:  Allergies  Allergen Reactions  . Codeine Nausea And Vomiting  . Ultram [Tramadol Hcl] Other (See Comments)    Confusion     No prescriptions prior to admission.    No results found for this or any previous visit (from the past 48  hour(s)). No results found.  Review of Systems  Constitutional: Negative.  Negative for chills and fever.  HENT: Negative.   Eyes: Negative.   Respiratory: Negative.   Cardiovascular: Negative.   Gastrointestinal: Negative.   Genitourinary: Negative.  Negative for hematuria.  Musculoskeletal: Negative.   Skin: Negative.   Neurological: Negative.   Endo/Heme/Allergies: Negative.   Psychiatric/Behavioral: Negative.     There were no vitals taken for this visit. Physical Exam  Constitutional: He is oriented to person, place, and time. He appears well-developed.  Very spry  HENT:  Head: Normocephalic.  Eyes: Pupils are equal, round, and reactive to light.  Neck: Normal range of motion.  Cardiovascular: Normal rate.   Respiratory: Effort normal.  GI: Soft.  Prior scars w/o hernias.   Genitourinary:  Genitourinary Comments: No CVAT  Musculoskeletal: Normal range of motion.  Neurological: He is alert and oriented to person, place, and time.  Skin: Skin is warm.  Psychiatric: He has a normal mood and affect.     Assessment/Plan  Proceed as planned with cysto, LEFT retrograde / stent exchange and TURBT / ureteroscopy if necessary. Risks, benefits, alternatives, expected peri-op course discussed previosly and reiterated today.   [name], MD 01/24/2017, 5:57 AM

## 2017-01-24 NOTE — Transfer of Care (Signed)
Immediate Anesthesia Transfer of Care Note  Patient: Austin Jackson  Procedure(s) Performed: Procedure(s): CYSTOSCOPY WITH RETROGRADE PYELOGRAM, AND STENT REPLACEMENT (Left)  Patient Location: PACU  Anesthesia Type:General  Level of Consciousness:  sedated, patient cooperative and responds to stimulation  Airway & Oxygen Therapy:Patient Spontanous Breathing and Patient connected to face mask oxgen  Post-op Assessment:  Report given to PACU RN and Post -op Vital signs reviewed and stable  Post vital signs:  Reviewed and stable  Last Vitals:  Vitals:   01/24/17 0637  BP: (!) 118/50  Pulse: 65  Resp: 16  Temp: (!) 85.9 C    Complications: No apparent anesthesia complications

## 2017-01-24 NOTE — Op Note (Signed)
NAME:  FINNIGAN, WARRINER                   ACCOUNT NO.:  MEDICAL RECORD NO.:  [id number]  LOCATION:                                 FACILITY:  PHYSICIAN:  [name], MD     DATE OF BIRTH:  08/12/1919  DATE OF PROCEDURE:  01/24/2017                              OPERATIVE REPORT   PREOPERATIVE DIAGNOSIS:  History of bladder cancer and right ureteral cancer, chronic left ureteral stricture.  PROCEDURE: 1. Cystoscopy with left retrograde pyelogram and interpretation. 2. Exchange of left ureteral stent, 6 x 26 Contour, no tether.  ESTIMATED BLOOD LOSS:  Nil.  COMPLICATIONS:  None.  SPECIMEN:  None.  FINDINGS: 1. No evidence of gross recurrent bladder tumor. 2. Continued multifocal narrowing and tortuosity of the left ureter. 3. Successful replacement of left ureteral stent, proximal in the     renal pelvis and distal in the urinary bladder.  INDICATION:  Mr. Surette is a very pleasant and quite vigorous 83 year old gentleman with history of multifocal urothelial carcinoma including right upper tract cancer as well as multifocal bladder cancer.  He underwent right nephroureterectomy earlier this year after which he did remarkably well.  He unfortunately does have a chronic left ureteral stricture stemming from site of prior bladder cancer.  He has been stent dependent in this solitary kidney.  He presents for repeat evaluation and stent exchange today.  Informed consent was obtained and placed in the medical record.  PROCEDURE IN DETAIL:  The patient being, Marcin Holte, was verified. Procedure being left ureteral stent change, possible transurethral resection of bladder tumor, possible ureteroscopy was confirmed. Procedure was carried out.  Time-out was performed.  Intravenous antibiotics were administered.  General LMA anesthesia was introduced. The patient was placed into a low lithotomy position.  Sterile field was created by prepping and draping the patient's penis,  perineum, proximal thighs using iodine.  Next, cystourethroscopy was performed using a rigid cystoscope with offset lens.  Inspection of the anterior and posterior urethra revealed a prior transurethral resection prostate defect.  No mucosal abnormalities were noted.  The right ureteral orifice was surgically absent.  There was no evidence of recurrent papillary tumor throughout the urinary bladder.  Distal end of stent was seen in situ.  This was grasped, was removed in its entirety.  The left ureteral orifice was cannulated with a 6-French end-hole catheter and left retrograde pyelogram was obtained.  Left retrograde pyelogram demonstrated single left ureter with single- system left kidney.  There was significant tortuosity, multifocal narrowing including at the UVJ in several places in the mid ureter. This was essentially unchanged and was consistent with his known stricture disease.  There were no obvious smooth filling defects worrisome for carcinoma.  A 0.038 ZIPwire was then advanced to the level of the upper pole, and a new 6 x 26 Contour-type stent was carefully placed using cystoscopic and fluoroscopic guidance.  Good proximal and distal deployment were noted.  Bladder was emptied per cystoscope. Procedure was then terminated.  The patient tolerated the procedure well.  There were no immediate periprocedural complications.  The patient was taken to the postanesthesia care unit in stable condition.  ______________________________ [name], MD     TM/MEDQ  D:  01/24/2017  T:  01/24/2017  Job:  [id number]

## 2017-01-24 NOTE — Discharge Instructions (Signed)
1 - You may have urinary urgency (bladder spasms) and bloody urine on / off with stent in place. This is normal. ° °2 - Call MD or go to ER for fever >102, severe pain / nausea / vomiting not relieved by medications, or acute change in medical status\ ° ° ° ° °General Anesthesia, Adult, Care After °These instructions provide you with information about caring for yourself after your procedure. Your health care provider may also give you more specific instructions. Your treatment has been planned according to current medical practices, but problems sometimes occur. Call your health care provider if you have any problems or questions after your procedure. °What can I expect after the procedure? °After the procedure, it is common to have: °· Vomiting. °· A sore throat. °· Mental slowness. ° °It is common to feel: °· Nauseous. °· Cold or shivery. °· Sleepy. °· Tired. °· Sore or achy, even in parts of your body where you did not have surgery. ° °Follow these instructions at home: °For at least 24 hours after the procedure: °· Do not: °? Participate in activities where you could fall or become injured. °? Drive. °? Use heavy machinery. °? Drink alcohol. °? Take sleeping pills or medicines that cause drowsiness. °? Make important decisions or sign legal documents. °? Take care of children on your own. °· Rest. °Eating and drinking °· If you vomit, drink water, juice, or soup when you can drink without vomiting. °· Drink enough fluid to keep your urine clear or pale yellow. °· Make sure you have little or no nausea before eating solid foods. °· Follow the diet recommended by your health care provider. °General instructions °· Have a responsible adult stay with you until you are awake and alert. °· Return to your normal activities as told by your health care provider. Ask your health care provider what activities are safe for you. °· Take over-the-counter and prescription medicines only as told by your health care  provider. °· If you smoke, do not smoke without supervision. °· Keep all follow-up visits as told by your health care provider. This is important. °Contact a health care provider if: °· You continue to have nausea or vomiting at home, and medicines are not helpful. °· You cannot drink fluids or start eating again. °· You cannot urinate after 8-12 hours. °· You develop a skin rash. °· You have fever. °· You have increasing redness at the site of your procedure. °Get help right away if: °· You have difficulty breathing. °· You have chest pain. °· You have unexpected bleeding. °· You feel that you are having a life-threatening or urgent problem. °This information is not intended to replace advice given to you by your health care provider. Make sure you discuss any questions you have with your health care provider. °Document Released: 10/02/2000 Document Revised: 11/29/2015 Document Reviewed: 06/10/2015 °Elsevier Interactive Patient Education © 2018 [employer] ° °

## 2017-01-24 NOTE — Interval H&P Note (Signed)
History and Physical Interval Note:  01/24/2017 8:15 AM  Austin Jackson  has presented today for surgery, with the diagnosis of LEFT URETERAL STRICTURE, HISTORY OF BLADDER CANCER  The various methods of treatment have been discussed with the patient and family. After consideration of risks, benefits and other options for treatment, the patient has consented to  Procedure(s): CYSTOSCOPY WITH RETROGRADE PYELOGRAM,POSSIBLE URETEROSCOPY AND STENT REPLACEMENT (Left) TRANSURETHRAL RESECTION OF BLADDER TUMOR (TURBT) (N/A) as a surgical intervention .  The patient's history has been reviewed, patient examined, no change in status, stable for surgery.  I have reviewed the patient's chart and labs.  Questions were answered to the patient's satisfaction.     Percy Winterrowd

## 2017-02-22 DIAGNOSIS — N182 Chronic kidney disease, stage 2 (mild): Secondary | ICD-10-CM | POA: Diagnosis not present

## 2017-02-22 DIAGNOSIS — I5032 Chronic diastolic (congestive) heart failure: Secondary | ICD-10-CM | POA: Diagnosis not present

## 2017-02-22 DIAGNOSIS — D508 Other iron deficiency anemias: Secondary | ICD-10-CM | POA: Diagnosis not present

## 2017-02-22 DIAGNOSIS — E038 Other specified hypothyroidism: Secondary | ICD-10-CM | POA: Diagnosis not present

## 2017-02-22 DIAGNOSIS — H4089 Other specified glaucoma: Secondary | ICD-10-CM | POA: Diagnosis not present

## 2017-02-22 DIAGNOSIS — N400 Enlarged prostate without lower urinary tract symptoms: Secondary | ICD-10-CM | POA: Diagnosis not present

## 2017-02-22 DIAGNOSIS — I481 Persistent atrial fibrillation: Secondary | ICD-10-CM | POA: Diagnosis not present

## 2017-03-31 ENCOUNTER — Emergency Department (HOSPITAL_COMMUNITY)
Admission: EM | Admit: 2017-03-31 | Discharge: 2017-03-31 | Disposition: A | Discharge status: Home / Self Care | Payer: Medicare Other | Attending: Emergency Medicine | Admitting: Emergency Medicine

## 2017-03-31 ENCOUNTER — Encounter (HOSPITAL_COMMUNITY): Payer: Self-pay | Admitting: Emergency Medicine

## 2017-03-31 ENCOUNTER — Emergency Department (HOSPITAL_COMMUNITY): Payer: Medicare Other

## 2017-03-31 DIAGNOSIS — E039 Hypothyroidism, unspecified: Secondary | ICD-10-CM | POA: Diagnosis not present

## 2017-03-31 DIAGNOSIS — R030 Elevated blood-pressure reading, without diagnosis of hypertension: Secondary | ICD-10-CM | POA: Diagnosis not present

## 2017-03-31 DIAGNOSIS — I509 Heart failure, unspecified: Secondary | ICD-10-CM | POA: Diagnosis not present

## 2017-03-31 DIAGNOSIS — N132 Hydronephrosis with renal and ureteral calculous obstruction: Secondary | ICD-10-CM | POA: Diagnosis not present

## 2017-03-31 DIAGNOSIS — N390 Urinary tract infection, site not specified: Secondary | ICD-10-CM | POA: Diagnosis not present

## 2017-03-31 DIAGNOSIS — Z87891 Personal history of nicotine dependence: Secondary | ICD-10-CM | POA: Diagnosis not present

## 2017-03-31 DIAGNOSIS — R1030 Lower abdominal pain, unspecified: Secondary | ICD-10-CM | POA: Diagnosis not present

## 2017-03-31 DIAGNOSIS — I130 Hypertensive heart and chronic kidney disease with heart failure and stage 1 through stage 4 chronic kidney disease, or unspecified chronic kidney disease: Secondary | ICD-10-CM | POA: Insufficient documentation

## 2017-03-31 DIAGNOSIS — Z79899 Other long term (current) drug therapy: Secondary | ICD-10-CM | POA: Diagnosis not present

## 2017-03-31 DIAGNOSIS — N183 Chronic kidney disease, stage 3 (moderate): Secondary | ICD-10-CM | POA: Diagnosis not present

## 2017-03-31 DIAGNOSIS — N400 Enlarged prostate without lower urinary tract symptoms: Secondary | ICD-10-CM | POA: Diagnosis not present

## 2017-03-31 DIAGNOSIS — R339 Retention of urine, unspecified: Secondary | ICD-10-CM | POA: Diagnosis not present

## 2017-03-31 LAB — URINALYSIS, ROUTINE W REFLEX MICROSCOPIC
BILIRUBIN URINE: NEGATIVE
Glucose, UA: NEGATIVE mg/dL
KETONES UR: NEGATIVE mg/dL
Nitrite: NEGATIVE
PH: 6 (ref 5.0–8.0)
Protein, ur: 30 mg/dL — AB
Specific Gravity, Urine: 1.005 (ref 1.005–1.030)
Squamous Epithelial / LPF: NONE SEEN

## 2017-03-31 LAB — BASIC METABOLIC PANEL
ANION GAP: 10 (ref 5–15)
BUN: 24 mg/dL — AB (ref 6–20)
CHLORIDE: 100 mmol/L — AB (ref 101–111)
CO2: 23 mmol/L (ref 22–32)
Calcium: 8.6 mg/dL — ABNORMAL LOW (ref 8.9–10.3)
Creatinine, Ser: 1.59 mg/dL — ABNORMAL HIGH (ref 0.61–1.24)
GFR calc Af Amer: 41 mL/min — ABNORMAL LOW (ref >60)
GFR, EST NON AFRICAN AMERICAN: 35 mL/min — AB (ref >60)
GLUCOSE: 127 mg/dL — AB (ref 65–99)
POTASSIUM: 3.8 mmol/L (ref 3.5–5.1)
SODIUM: 133 mmol/L — AB (ref 135–145)

## 2017-03-31 LAB — CBC
HCT: 34.2 % — ABNORMAL LOW (ref 39.0–52.0)
HEMOGLOBIN: 11.3 g/dL — AB (ref 13.0–17.0)
MCH: 32.1 pg (ref 26.0–34.0)
MCHC: 33 g/dL (ref 30.0–36.0)
MCV: 97.2 fL (ref 78.0–100.0)
PLATELETS: 181 10*3/uL (ref 150–400)
RBC: 3.52 MIL/uL — AB (ref 4.22–5.81)
RDW: 16.4 % — ABNORMAL HIGH (ref 11.5–15.5)
WBC: 4.4 10*3/uL (ref 4.0–10.5)

## 2017-03-31 MED ORDER — CEFTRIAXONE SODIUM 1 G IJ SOLR
1.0000 g | Freq: Once | INTRAMUSCULAR | Status: AC
  Administered 2017-03-31: 1 g via INTRAVENOUS
  Filled 2017-03-31: qty 10

## 2017-03-31 MED ORDER — LIDOCAINE HCL 2 % EX GEL
1.0000 "application " | Freq: Once | CUTANEOUS | Status: AC | PRN
  Administered 2017-03-31: 1 via URETHRAL
  Filled 2017-03-31: qty 11

## 2017-03-31 MED ORDER — SODIUM CHLORIDE 0.9 % IV BOLUS (SEPSIS)
1000.0000 mL | Freq: Once | INTRAVENOUS | Status: AC
  Administered 2017-03-31: 1000 mL via INTRAVENOUS

## 2017-03-31 MED ORDER — CEPHALEXIN 500 MG PO CAPS: 500.0000 mg | ORAL_CAPSULE | Freq: Three times a day (TID) | ORAL | 0 refills | Status: DC

## 2017-03-31 NOTE — ED Notes (Signed)
Bed: YD28 Expected date:  Expected time:  Means of arrival:  Comments: EMS unable to urinate

## 2017-03-31 NOTE — ED Triage Notes (Signed)
Brought in by EMS from home with c/o urinary retention.  Pt reported that he has not voided since 2000 last night---- pt has hx of renal/bladder cancer.

## 2017-03-31 NOTE — ED Provider Notes (Signed)
[facility] DEPT Provider Note   CSN: [id number] Arrival date & time: 03/31/17  0421     History   Chief Complaint Chief Complaint  Patient presents with  . Urinary Retention    HPI Austin Jackson is a 83 y.o. male.  Patient c/o lower abd pain, and feeling as if unable to empty bladder since last night. Notes hx same. States had hx kidney and ?bladder cancer. Denies hematuria or dysuria. States voided normally last evening, but only able to pass very little dribbles since. Has urge to go. Also c/o diffuse lower abd and suprapubic pain. No back/flank pain. No fever or chills.    The history is provided by the patient.    Past Medical History:  Diagnosis Date  . Bilateral lower extremity edema   . Bladder cancer ([place])   . CHF (congestive heart failure) (HCC)    mild  . Chronic atrial fibrillation ([place])    dx 2002--  s/p cardioversion approx 2006 or 2007-- per pt last cardiologist visit at that time-- currently be followed by pcp dr Theodis Blaze in [place]  . CKD (chronic kidney disease), stage III   . Dysrhythmia   . Full dentures   . GERD (gastroesophageal reflux disease)   . Glaucoma, both eyes   . Gross hematuria    intermittent  . History of BPH   . History of GI bleed    2013--  per pt unable to find reason but had been on blood thinner,  was transfusion's  . History of kidney stones   . Hypertension   . Hypothyroidism due to amiodarone   . Iron deficiency anemia   . Macular degeneration of both eyes   . OA (osteoarthritis)   . PONV (postoperative nausea and vomiting)   . Renal mass, right    renal pelvis    Patient Active Problem List   Diagnosis Date Noted  . Urothelial carcinoma of kidney, right ([place]) 10/12/2016  . Acute renal failure (ARF) ([place]) 07/19/2016  . Hydronephrosis 07/19/2016  . Normocytic normochromic anemia 07/19/2016  . Hypothyroidism 07/19/2016  . Hypertension 07/19/2016  . Acute renal failure ([place]) 07/19/2016  . SIRS (systemic  inflammatory response syndrome) ([place]) 07/19/2016  . Hyperkalemia 03/02/2017  . Bladder cancer ([place]) 07/12/2016    Past Surgical History:  Procedure Laterality Date  . CARDIOVERSION  2002  . COLONOSCOPY  yrs ago  . CYSTOSCOPY WITH RETROGRADE PYELOGRAM, URETEROSCOPY AND STENT PLACEMENT Left 07/19/2016   Procedure: CYSTOSCOPY WITH RETROGRADE PYELOGRAM,  AND STENT PLACEMENT;  Surgeon: [name], MD;  Location: [facility];  Service: Urology;  Laterality: Left;  . CYSTOSCOPY WITH RETROGRADE PYELOGRAM, URETEROSCOPY AND STENT PLACEMENT Left 01/24/2017   Procedure: CYSTOSCOPY WITH RETROGRADE PYELOGRAM, AND STENT REPLACEMENT;  Surgeon: [name], MD;  Location: [facility];  Service: Urology;  Laterality: Left;  . CYSTOSCOPY/RETROGRADE/URETEROSCOPY Bilateral 09/15/2016   Procedure: CYSTOSCOPY/ RETROGRADE/DIAGNOSITC URETEROSCOPY/URETERAL STENT EXCHANGE;  Surgeon: [name], MD;  Location: [facility];  Service: Urology;  Laterality: Bilateral;  . EXTRACORPOREAL SHOCK WAVE LITHOTRIPSY  1970's  . EYE SURGERY Left    for glaucoma  . EYE SURGERY Bilateral    cataract extraction with IOL  . INGUINAL HERNIA REPAIR Bilateral 2007 and 1980's  . ROBOT ASSITED LAPAROSCOPIC NEPHROURETERECTOMY Right 10/12/2016   Procedure: XI ROBOT ASSITED LAPAROSCOPIC NEPHROURETERECTOMY;  Surgeon: [name], MD;  Location: [facility];  Service: Urology;  Laterality: Right;  . TRANSURETHRAL RESECTION OF BLADDER TUMOR Right 07/12/2016   Procedure: TRANSURETHRAL RESECTION OF BLADDER TUMOR (  TURBT)/ BILATERAL RETROGRADE PYLEOGRAM/ RIGHT DIAGNOSTIC URETEROSCOPY;  Surgeon: [name], MD;  Location: [facility] [place];  Service: Urology;  Laterality: Right;  . TRANSURETHRAL RESECTION OF PROSTATE  2002       Home Medications    Prior to Admission medications   Medication Sig Start Date End Date Taking? Authorizing Provider  acetaminophen (TYLENOL) 500 MG tablet Take 1,000 mg by mouth every 12 (twelve) hours. 6 am and 6  pm    [provider]  ALPHAGAN P 0.1 % SOLN Place 1 drop into both eyes 3 (three) times daily.  07/01/16   [provider]  calcium carbonate (TUMS EX) 750 MG chewable tablet Chew 750 tablets by mouth 2 (two) times daily as needed for heartburn.    [provider]  Ferrous Sulfate (IRON) 325 (65 Fe) MG TABS Take 325 mg by mouth 2 (two) times daily.     [provider]  furosemide (LASIX) 20 MG tablet Take 20 mg by mouth 2 (two) times daily.     [provider]  gabapentin (NEURONTIN) 300 MG capsule Take 300 mg by mouth at bedtime.    [provider]  latanoprost (XALATAN) 0.005 % ophthalmic solution Place 1 drop into both eyes at bedtime.     [provider]  levothyroxine (SYNTHROID, LEVOTHROID) 50 MCG tablet Take 50 mcg by mouth at bedtime.    [provider]  metoprolol tartrate (LOPRESSOR) 25 MG tablet Take 12.5 mg by mouth 3 (three) times daily.     [provider]  tamsulosin (FLOMAX) 0.4 MG CAPS capsule Take 0.4 mg by mouth daily after supper.    [provider]    Family History Family History  Problem Relation Age of Onset  . Kidney cancer Neg Hx   . Diabetes Mellitus II Neg Hx   . CAD Neg Hx     Social History Social History  Substance Use Topics  . Smoking status: Former Smoker    Packs/day: 2.00    Years: 30.00    Types: Cigarettes    Quit date: 07/07/1984  . Smokeless tobacco: Never Used  . Alcohol use Yes     Comment: occasional beer     Allergies   Codeine and Ultram [tramadol hcl]   Review of Systems Review of Systems  Constitutional: Negative for fever.  HENT: Negative for sore throat.   Eyes: Negative for redness.  Respiratory: Negative for shortness of breath.   Cardiovascular: Negative for chest pain.  Gastrointestinal: Negative for abdominal pain and vomiting.  Genitourinary: Negative for dysuria, flank pain, scrotal swelling and testicular pain.    Musculoskeletal: Negative for back pain.  Skin: Negative for rash.  Neurological: Negative for headaches.  Hematological: Does not bruise/bleed easily.  Psychiatric/Behavioral: Negative for confusion.     Physical Exam Updated Vital Signs BP (!) 161/90   Pulse 90   Temp 97.7 F (36.5 C) (Oral)   Resp 18   Ht 1.778 m (5\' 10" )   Wt 69.9 kg (154 lb)   SpO2 98%   BMI 22.10 kg/m   Physical Exam  Constitutional: He appears well-developed and well-nourished. No distress.  HENT:  Mouth/Throat: Oropharynx is clear and moist.  Eyes: Conjunctivae are normal.  Neck: Neck supple. No tracheal deviation present.  Cardiovascular: Normal rate, regular rhythm, normal heart sounds and intact distal pulses.   Pulmonary/Chest: Effort normal and breath sounds normal. No accessory muscle usage. No respiratory distress.  Abdominal: Soft. Bowel sounds are normal.  He exhibits no distension. There is tenderness.  Moderate lower abd tenderness.  No puls mass.   Genitourinary:  Genitourinary Comments: No cva tenderness. Normal external gu exam.   Musculoskeletal: He exhibits no edema.  Neurological: He is alert.  Skin: Skin is warm and dry. He is not diaphoretic.  Psychiatric: He has a normal mood and affect.  Nursing note and vitals reviewed.    ED Treatments / Results  Labs (all labs ordered are listed, but only abnormal results are displayed) Results for orders placed or performed during the hospital encounter of 03/31/17  Urinalysis, Routine w reflex microscopic  Result Value Ref Range   Color, Urine YELLOW YELLOW   APPearance HAZY (A) CLEAR   Specific Gravity, Urine 1.005 1.005 - 1.030   pH 6.0 5.0 - 8.0   Glucose, UA NEGATIVE NEGATIVE mg/dL   Hgb urine dipstick LARGE (A) NEGATIVE   Bilirubin Urine NEGATIVE NEGATIVE   Ketones, ur NEGATIVE NEGATIVE mg/dL   Protein, ur 30 (A) NEGATIVE mg/dL   Nitrite NEGATIVE NEGATIVE   Leukocytes, UA LARGE (A) NEGATIVE   RBC / HPF TOO NUMEROUS TO  COUNT 0 - 5 RBC/hpf   WBC, UA TOO NUMEROUS TO COUNT 0 - 5 WBC/hpf   Bacteria, UA RARE (A) NONE SEEN   Squamous Epithelial / LPF NONE SEEN NONE SEEN   WBC Clumps PRESENT    Crystals PRESENT (A) NEGATIVE  CBC  Result Value Ref Range   WBC 4.4 4.0 - 10.5 K/uL   RBC 3.52 (L) 4.22 - 5.81 MIL/uL   Hemoglobin 11.3 (L) 13.0 - 17.0 g/dL   HCT 34.2 (L) 39.0 - 52.0 %   MCV 97.2 78.0 - 100.0 fL   MCH 32.1 26.0 - 34.0 pg   MCHC 33.0 30.0 - 36.0 g/dL   RDW 16.4 (H) 11.5 - 15.5 %   Platelets 181 150 - 400 K/uL  Basic metabolic panel  Result Value Ref Range   Sodium 133 (L) 135 - 145 mmol/L   Potassium 3.8 3.5 - 5.1 mmol/L   Chloride 100 (L) 101 - 111 mmol/L   CO2 23 22 - 32 mmol/L   Glucose, Bld 127 (H) 65 - 99 mg/dL   BUN 24 (H) 6 - 20 mg/dL   Creatinine, Ser 1.59 (H) 0.61 - 1.24 mg/dL   Calcium 8.6 (L) 8.9 - 10.3 mg/dL   GFR calc non Af Amer 35 (L) >60 mL/min   GFR calc Af Amer 41 (L) >60 mL/min   Anion gap 10 5 - 15   Ct Abdomen Pelvis Wo Contrast  Result Date: 03/31/2017 CLINICAL DATA:  Urinary retention. History of renal and bladder cancer. EXAM: CT ABDOMEN AND PELVIS WITHOUT CONTRAST TECHNIQUE: Multidetector CT imaging of the abdomen and pelvis was performed following the standard protocol without IV contrast. COMPARISON:  04-06-2017 FINDINGS: Lower chest: Small pleural effusion or thickening on the right. Interstitial pattern to the lung bases could represent fibrosis or edema. Cardiac enlargement. Coronary artery calcifications. Hepatobiliary: No focal liver abnormality is seen. No gallstones, gallbladder wall thickening, or biliary dilatation. Pancreas: Unremarkable. No pancreatic ductal dilatation or surrounding inflammatory changes. Spleen: Normal in size without focal abnormality. Adrenals/Urinary Tract: Adrenal glands are enlarged bilaterally. Left adrenal gland measures 1.6 cm diameter. Density measurements suggests likely adenoma. No change since prior study. The right kidney is  surgically absent since the previous study. Left kidney demonstrates hydronephrosis and hydroureter with a ureteral stent in place. Hemorrhagic cyst off of the superior pole of the  left kidney. Bladder is decompressed with a Foley catheter. Can't exclude bladder wall thickening. Stomach/Bowel: Stomach, small bowel, and colon are not abnormally distended. Stool-filled colon. Sigmoid colonic diverticulosis without inflammatory change. Appendix is not identified. Vascular/Lymphatic: Prominent calcification in the celiac axis and superior mesenteric artery as well. No significant lymphadenopathy. Reproductive: Prostate gland is enlarged, measuring 4.9 cm diameter. Other: No free air or free fluid in the abdomen. Scarring in the anterior abdominal wall is likely postoperative. Postoperative changes in the right groin. Midline abdominal wall hernia above the umbilicus containing fat. Musculoskeletal: Degenerative changes in the spine. No destructive bone lesions. Compression of T11 without change. IMPRESSION: 1. Cardiac enlargement with small pleural effusion or thickening on the right. Interstitial pattern to the lungs could represent fibrosis or edema. 2. Adrenal gland nodules likely adenoma. No change. Right kidney surgically absent. 3. Left renal hydronephrosis and hydroureter with the ureteral stent in place. 4. Bladder is decompressed, limiting evaluation. Foley catheter in place. 5. Prostate gland enlargement. 6. Aortic atherosclerosis with prominent vascular calcifications. Electronically Signed   By: Lucienne Capers M.D.   On: 03/31/2017 05:32    EKG  EKG Interpretation None       Radiology No results found.  Procedures Procedures (including critical care time)  Medications Ordered in ED Medications  lidocaine (XYLOCAINE) 2 % jelly 1 application (not administered)     Initial Impression / Assessment and Plan / ED Course  I have reviewed the triage vital signs and the nursing  notes.  Pertinent labs & imaging results that were available during my care of the patient were reviewed by me and considered in my medical decision making (see chart for details).  Labs and imaging study ordered.   Ua.  Reviewed nursing notes and prior charts for additional history.   uti on labs, other lab results appear c/w baseline.  CT without acute process.  Pt with 150 cc after in and out cath.   Tolerating po.  Recheck, vitals normal.  Pt appears stable for d/c.  Will have f/u pcp Monday.  Return precautions provided.    Final Clinical Impressions(s) / ED Diagnoses   Final diagnoses:  None    New Prescriptions New Prescriptions   No medications on file     Lajean Saver, MD 03/31/17 [phone]

## 2017-03-31 NOTE — Discharge Instructions (Signed)
It was our pleasure to provide your ER care today - we hope that you feel better.  Rest. Drink plenty of fluids.  Take keflex (antibiotic) as prescribed.  Follow up with primary care doctor Monday for recheck.  Return to ER if worse, new symptoms, persistent vomiting, severe abdominal pain, trouble voiding, other concern.

## 2017-04-02 LAB — URINE CULTURE: Culture: NO GROWTH

## 2017-04-17 DIAGNOSIS — H02831 Dermatochalasis of right upper eyelid: Secondary | ICD-10-CM | POA: Diagnosis not present

## 2017-04-17 DIAGNOSIS — H02834 Dermatochalasis of left upper eyelid: Secondary | ICD-10-CM | POA: Diagnosis not present

## 2017-04-17 DIAGNOSIS — H401233 Low-tension glaucoma, bilateral, severe stage: Secondary | ICD-10-CM | POA: Diagnosis not present

## 2017-05-08 ENCOUNTER — Encounter (HOSPITAL_COMMUNITY): Payer: Self-pay | Admitting: Emergency Medicine

## 2017-05-08 ENCOUNTER — Emergency Department (HOSPITAL_COMMUNITY): Payer: Medicare Other

## 2017-05-08 ENCOUNTER — Inpatient Hospital Stay (HOSPITAL_COMMUNITY)
Admission: EM | Admit: 2017-05-08 | Discharge: 2017-05-09 | DRG: 689 | Disposition: A | Discharge status: Home Health | Payer: Medicare Other | Attending: Family Medicine | Admitting: Family Medicine

## 2017-05-08 ENCOUNTER — Inpatient Hospital Stay (HOSPITAL_COMMUNITY): Payer: Medicare Other

## 2017-05-08 DIAGNOSIS — L03115 Cellulitis of right lower limb: Secondary | ICD-10-CM | POA: Diagnosis present

## 2017-05-08 DIAGNOSIS — H3530 Unspecified macular degeneration: Secondary | ICD-10-CM | POA: Diagnosis present

## 2017-05-08 DIAGNOSIS — N390 Urinary tract infection, site not specified: Secondary | ICD-10-CM | POA: Diagnosis not present

## 2017-05-08 DIAGNOSIS — Z9079 Acquired absence of other genital organ(s): Secondary | ICD-10-CM

## 2017-05-08 DIAGNOSIS — I4891 Unspecified atrial fibrillation: Secondary | ICD-10-CM

## 2017-05-08 DIAGNOSIS — N183 Chronic kidney disease, stage 3 unspecified: Secondary | ICD-10-CM

## 2017-05-08 DIAGNOSIS — N136 Pyonephrosis: Principal | ICD-10-CM | POA: Diagnosis present

## 2017-05-08 DIAGNOSIS — Z9841 Cataract extraction status, right eye: Secondary | ICD-10-CM

## 2017-05-08 DIAGNOSIS — H409 Unspecified glaucoma: Secondary | ICD-10-CM | POA: Diagnosis present

## 2017-05-08 DIAGNOSIS — Z885 Allergy status to narcotic agent status: Secondary | ICD-10-CM

## 2017-05-08 DIAGNOSIS — Z8551 Personal history of malignant neoplasm of bladder: Secondary | ICD-10-CM

## 2017-05-08 DIAGNOSIS — C641 Malignant neoplasm of right kidney, except renal pelvis: Secondary | ICD-10-CM | POA: Diagnosis not present

## 2017-05-08 DIAGNOSIS — R109 Unspecified abdominal pain: Secondary | ICD-10-CM | POA: Diagnosis not present

## 2017-05-08 DIAGNOSIS — Z66 Do not resuscitate: Secondary | ICD-10-CM | POA: Diagnosis present

## 2017-05-08 DIAGNOSIS — I248 Other forms of acute ischemic heart disease: Secondary | ICD-10-CM | POA: Diagnosis present

## 2017-05-08 DIAGNOSIS — R410 Disorientation, unspecified: Secondary | ICD-10-CM

## 2017-05-08 DIAGNOSIS — G92 Toxic encephalopathy: Secondary | ICD-10-CM | POA: Diagnosis present

## 2017-05-08 DIAGNOSIS — Z23 Encounter for immunization: Secondary | ICD-10-CM | POA: Diagnosis not present

## 2017-05-08 DIAGNOSIS — R269 Unspecified abnormalities of gait and mobility: Secondary | ICD-10-CM | POA: Diagnosis not present

## 2017-05-08 DIAGNOSIS — D649 Anemia, unspecified: Secondary | ICD-10-CM | POA: Diagnosis present

## 2017-05-08 DIAGNOSIS — R4182 Altered mental status, unspecified: Secondary | ICD-10-CM | POA: Diagnosis present

## 2017-05-08 DIAGNOSIS — Z905 Acquired absence of kidney: Secondary | ICD-10-CM

## 2017-05-08 DIAGNOSIS — N401 Enlarged prostate with lower urinary tract symptoms: Secondary | ICD-10-CM | POA: Diagnosis present

## 2017-05-08 DIAGNOSIS — I6789 Other cerebrovascular disease: Secondary | ICD-10-CM | POA: Diagnosis not present

## 2017-05-08 DIAGNOSIS — I482 Chronic atrial fibrillation, unspecified: Secondary | ICD-10-CM | POA: Diagnosis present

## 2017-05-08 DIAGNOSIS — I130 Hypertensive heart and chronic kidney disease with heart failure and stage 1 through stage 4 chronic kidney disease, or unspecified chronic kidney disease: Secondary | ICD-10-CM | POA: Diagnosis present

## 2017-05-08 DIAGNOSIS — Z961 Presence of intraocular lens: Secondary | ICD-10-CM

## 2017-05-08 DIAGNOSIS — R600 Localized edema: Secondary | ICD-10-CM | POA: Diagnosis present

## 2017-05-08 DIAGNOSIS — M79606 Pain in leg, unspecified: Secondary | ICD-10-CM

## 2017-05-08 DIAGNOSIS — Z79899 Other long term (current) drug therapy: Secondary | ICD-10-CM

## 2017-05-08 DIAGNOSIS — Z87442 Personal history of urinary calculi: Secondary | ICD-10-CM

## 2017-05-08 DIAGNOSIS — R3912 Poor urinary stream: Secondary | ICD-10-CM | POA: Diagnosis present

## 2017-05-08 DIAGNOSIS — I872 Venous insufficiency (chronic) (peripheral): Secondary | ICD-10-CM | POA: Diagnosis present

## 2017-05-08 DIAGNOSIS — K219 Gastro-esophageal reflux disease without esophagitis: Secondary | ICD-10-CM | POA: Diagnosis present

## 2017-05-08 DIAGNOSIS — K5909 Other constipation: Secondary | ICD-10-CM | POA: Diagnosis present

## 2017-05-08 DIAGNOSIS — Z79890 Hormone replacement therapy (postmenopausal): Secondary | ICD-10-CM | POA: Diagnosis not present

## 2017-05-08 DIAGNOSIS — I10 Essential (primary) hypertension: Secondary | ICD-10-CM | POA: Diagnosis not present

## 2017-05-08 DIAGNOSIS — G9341 Metabolic encephalopathy: Secondary | ICD-10-CM | POA: Diagnosis not present

## 2017-05-08 DIAGNOSIS — E039 Hypothyroidism, unspecified: Secondary | ICD-10-CM | POA: Diagnosis present

## 2017-05-08 DIAGNOSIS — Z87891 Personal history of nicotine dependence: Secondary | ICD-10-CM

## 2017-05-08 DIAGNOSIS — N1330 Unspecified hydronephrosis: Secondary | ICD-10-CM | POA: Diagnosis not present

## 2017-05-08 DIAGNOSIS — M1990 Unspecified osteoarthritis, unspecified site: Secondary | ICD-10-CM | POA: Diagnosis present

## 2017-05-08 DIAGNOSIS — N10 Acute tubulo-interstitial nephritis: Secondary | ICD-10-CM

## 2017-05-08 DIAGNOSIS — Z85528 Personal history of other malignant neoplasm of kidney: Secondary | ICD-10-CM

## 2017-05-08 DIAGNOSIS — X58XXXA Exposure to other specified factors, initial encounter: Secondary | ICD-10-CM | POA: Diagnosis present

## 2017-05-08 DIAGNOSIS — S30810A Abrasion of lower back and pelvis, initial encounter: Secondary | ICD-10-CM | POA: Diagnosis present

## 2017-05-08 DIAGNOSIS — C649 Malignant neoplasm of unspecified kidney, except renal pelvis: Secondary | ICD-10-CM

## 2017-05-08 DIAGNOSIS — I509 Heart failure, unspecified: Secondary | ICD-10-CM | POA: Diagnosis present

## 2017-05-08 DIAGNOSIS — Z8249 Family history of ischemic heart disease and other diseases of the circulatory system: Secondary | ICD-10-CM

## 2017-05-08 DIAGNOSIS — Z9842 Cataract extraction status, left eye: Secondary | ICD-10-CM

## 2017-05-08 DIAGNOSIS — I82409 Acute embolism and thrombosis of unspecified deep veins of unspecified lower extremity: Secondary | ICD-10-CM | POA: Diagnosis not present

## 2017-05-08 DIAGNOSIS — Z8554 Personal history of malignant neoplasm of ureter: Secondary | ICD-10-CM

## 2017-05-08 DIAGNOSIS — Z823 Family history of stroke: Secondary | ICD-10-CM

## 2017-05-08 LAB — AMMONIA

## 2017-05-08 LAB — CBC WITH DIFFERENTIAL/PLATELET
Basophils Absolute: 0 10*3/uL (ref 0.0–0.1)
Basophils Relative: 0 %
Eosinophils Absolute: 0 10*3/uL (ref 0.0–0.7)
Eosinophils Relative: 0 %
HCT: 38.1 % — ABNORMAL LOW (ref 39.0–52.0)
Hemoglobin: 12.2 g/dL — ABNORMAL LOW (ref 13.0–17.0)
LYMPHS ABS: 1.2 10*3/uL (ref 0.7–4.0)
LYMPHS PCT: 17 %
MCH: 32.2 pg (ref 26.0–34.0)
MCHC: 32 g/dL (ref 30.0–36.0)
MCV: 100.5 fL — ABNORMAL HIGH (ref 78.0–100.0)
MONOS PCT: 8 %
Monocytes Absolute: 0.6 10*3/uL (ref 0.1–1.0)
NEUTROS PCT: 75 %
Neutro Abs: 5.2 10*3/uL (ref 1.7–7.7)
PLATELETS: 130 10*3/uL — AB (ref 150–400)
RBC: 3.79 MIL/uL — AB (ref 4.22–5.81)
RDW: 16.7 % — ABNORMAL HIGH (ref 11.5–15.5)
WBC: 7 10*3/uL (ref 4.0–10.5)

## 2017-05-08 LAB — COMPREHENSIVE METABOLIC PANEL
ALT: 10 U/L — AB (ref 17–63)
AST: 22 U/L (ref 15–41)
Albumin: 3.1 g/dL — ABNORMAL LOW (ref 3.5–5.0)
Alkaline Phosphatase: 70 U/L (ref 38–126)
Anion gap: 10 (ref 5–15)
BUN: 28 mg/dL — ABNORMAL HIGH (ref 6–20)
CHLORIDE: 103 mmol/L (ref 101–111)
CO2: 24 mmol/L (ref 22–32)
CREATININE: 1.63 mg/dL — AB (ref 0.61–1.24)
Calcium: 8.5 mg/dL — ABNORMAL LOW (ref 8.9–10.3)
GFR, EST AFRICAN AMERICAN: 40 mL/min — AB (ref >60)
GFR, EST NON AFRICAN AMERICAN: 34 mL/min — AB (ref >60)
Glucose, Bld: 108 mg/dL — ABNORMAL HIGH (ref 65–99)
POTASSIUM: 4 mmol/L (ref 3.5–5.1)
SODIUM: 137 mmol/L (ref 135–145)
Total Bilirubin: 1.4 mg/dL — ABNORMAL HIGH (ref 0.3–1.2)
Total Protein: 8.2 g/dL — ABNORMAL HIGH (ref 6.5–8.1)

## 2017-05-08 LAB — TROPONIN I
TROPONIN I: 0.08 ng/mL — AB (ref <0.03)
Troponin I: 0.09 ng/mL (ref <0.03)
Troponin I: 0.09 ng/mL (ref <0.03)

## 2017-05-08 LAB — URINALYSIS, ROUTINE W REFLEX MICROSCOPIC
BILIRUBIN URINE: NEGATIVE
GLUCOSE, UA: NEGATIVE mg/dL
Ketones, ur: NEGATIVE mg/dL
NITRITE: NEGATIVE
PROTEIN: 100 mg/dL — AB
Specific Gravity, Urine: 1.013 (ref 1.005–1.030)
Squamous Epithelial / LPF: NONE SEEN
pH: 6 (ref 5.0–8.0)

## 2017-05-08 MED ORDER — VANCOMYCIN HCL 500 MG IV SOLR
500.0000 mg | INTRAVENOUS | Status: DC
  Administered 2017-05-09: 500 mg via INTRAVENOUS
  Filled 2017-05-08 (×2): qty 500

## 2017-05-08 MED ORDER — VANCOMYCIN HCL IN DEXTROSE 1-5 GM/200ML-% IV SOLN
1000.0000 mg | Freq: Once | INTRAVENOUS | Status: AC
  Administered 2017-05-08: 1000 mg via INTRAVENOUS
  Filled 2017-05-08: qty 200

## 2017-05-08 MED ORDER — FERROUS SULFATE 325 (65 FE) MG PO TABS
325.0000 mg | ORAL_TABLET | Freq: Two times a day (BID) | ORAL | Status: DC
  Administered 2017-05-08 – 2017-05-09 (×3): 325 mg via ORAL
  Filled 2017-05-08 (×3): qty 1

## 2017-05-08 MED ORDER — LEVOTHYROXINE SODIUM 50 MCG PO TABS
50.0000 ug | ORAL_TABLET | Freq: Every day | ORAL | Status: DC
  Administered 2017-05-08: 50 ug via ORAL
  Filled 2017-05-08: qty 1

## 2017-05-08 MED ORDER — TAMSULOSIN HCL 0.4 MG PO CAPS
0.4000 mg | ORAL_CAPSULE | Freq: Every day | ORAL | Status: DC
  Administered 2017-05-08: 0.4 mg via ORAL
  Filled 2017-05-08: qty 1

## 2017-05-08 MED ORDER — GABAPENTIN 100 MG PO CAPS: 100.0000 mg | ORAL_CAPSULE | Freq: Every day | ORAL | Status: DC

## 2017-05-08 MED ORDER — BRIMONIDINE TARTRATE 0.15 % OP SOLN
1.0000 [drp] | Freq: Three times a day (TID) | OPHTHALMIC | Status: DC
  Administered 2017-05-08 – 2017-05-09 (×4): 1 [drp] via OPHTHALMIC
  Filled 2017-05-08: qty 5

## 2017-05-08 MED ORDER — INFLUENZA VAC SPLIT HIGH-DOSE 0.5 ML IM SUSY
0.5000 mL | PREFILLED_SYRINGE | INTRAMUSCULAR | Status: AC
  Administered 2017-05-09: 0.5 mL via INTRAMUSCULAR
  Filled 2017-05-08: qty 0.5

## 2017-05-08 MED ORDER — ACETAMINOPHEN 325 MG PO TABS: 650.0000 mg | ORAL_TABLET | Freq: Four times a day (QID) | ORAL | Status: DC | PRN

## 2017-05-08 MED ORDER — METOPROLOL TARTRATE 25 MG PO TABS
12.5000 mg | ORAL_TABLET | Freq: Three times a day (TID) | ORAL | Status: DC
  Administered 2017-05-08 – 2017-05-09 (×4): 12.5 mg via ORAL
  Filled 2017-05-08 (×4): qty 1

## 2017-05-08 MED ORDER — CALCIUM CARBONATE ANTACID 500 MG PO CHEW: 750.0000 mg | CHEWABLE_TABLET | Freq: Two times a day (BID) | ORAL | Status: DC | PRN

## 2017-05-08 MED ORDER — CEFTRIAXONE SODIUM 1 G IJ SOLR
1.0000 g | Freq: Once | INTRAMUSCULAR | Status: AC
  Administered 2017-05-08: 1 g via INTRAVENOUS
  Filled 2017-05-08: qty 10

## 2017-05-08 MED ORDER — SODIUM CHLORIDE 0.9 % IV SOLN
INTRAVENOUS | Status: AC
  Administered 2017-05-08 (×2): via INTRAVENOUS

## 2017-05-08 MED ORDER — ONDANSETRON HCL 4 MG PO TABS: 4.0000 mg | ORAL_TABLET | Freq: Four times a day (QID) | ORAL | Status: DC | PRN

## 2017-05-08 MED ORDER — DEXTROSE 5 % IV SOLN
1.0000 g | INTRAVENOUS | Status: DC
  Administered 2017-05-09: 1 g via INTRAVENOUS
  Filled 2017-05-08 (×2): qty 10

## 2017-05-08 MED ORDER — ENSURE ENLIVE PO LIQD
237.0000 mL | Freq: Two times a day (BID) | ORAL | Status: DC
  Administered 2017-05-08 – 2017-05-09 (×3): 237 mL via ORAL

## 2017-05-08 MED ORDER — ONDANSETRON HCL 4 MG/2ML IJ SOLN: 4.0000 mg | Freq: Four times a day (QID) | INTRAMUSCULAR | Status: DC | PRN

## 2017-05-08 MED ORDER — LATANOPROST 0.005 % OP SOLN
1.0000 [drp] | Freq: Every day | OPHTHALMIC | Status: DC
  Administered 2017-05-08: 1 [drp] via OPHTHALMIC
  Filled 2017-05-08: qty 2.5

## 2017-05-08 MED ORDER — ENOXAPARIN SODIUM 30 MG/0.3ML ~~LOC~~ SOLN
30.0000 mg | SUBCUTANEOUS | Status: DC
  Filled 2017-05-08 (×2): qty 0.3

## 2017-05-08 NOTE — Progress Notes (Addendum)
PROGRESS NOTE  Austin Jackson YBO:[id number] DOB: 1920/01/04 DOA: 05/08/2017 PCP: Neale Burly, MD  Brief History:  83 year old male with a history of chronic atrial fibrillation, CKD stage III, essential hypertension, urothelial carcinoma, left ureteral stricture with stent, iron deficiency anemia, hypothyroidism presented with paranoia and left flank pain.  The patient is alert and oriented x4, but states that he has been hearing and seeing strangers outside his house they are trying to come inside his house.  The patient called the police, and the patient stated that it was recommended that he come to the hospital for further evaluation.  The patient complains of one-week history of left flank pain with associated nausea without any emesis, diarrhea, hematochezia, melena.  He has had some dysuria and difficulty starting his urinary stream.  However he denies any fevers, chills, chest pain, shortness breath, headache, neck pain.  He has not started any new medications.  Upon presentation, the patient was afebrile and hemodynamically stable with WBC 7.0.  UA showed TNTC WBC, TNTC RBC.  The patient was started on ceftriaxone pending culture data.  Assessment/Plan: Acute metabolic encephalopathy -Likely due to infectious process--urine and leg -Ammonia <9 -TSH -Serum B12 -RPR  Pyuria with Left Flank Pain and Left hydronephrosis -concerned about pyelonephritis -Patient has chronic left ureteral stricture and left hydronephrosis with chronic left ureteral stent (03/31/17 CT showed L-hydronephrosis) -January 24, 2017--left ureteral stent exchange--Dr. Tresa Moore -Continue antibiotics pending culture data -case discussed with urology,  Dr. Louis Meckel, will see patient but does not feel pt needs acute urologic intervention -CT renal--left ureteral stent in place with hydronephrosis; diverticulosis; moderate stool; remote T11 fracture; 8 mm exophytic L-renal hyperdense lesion; bladder wall  thickening  Cellulitis Right lower extremity -Although the patient has a degree of venous stasis dermatitis, right lower extremity appears significantly more erythematous than the left with blanchable erythema -Start empiric vancomycin  Elevated troponin -due to demand ischemia -no chest pain -personally reviewed EKG--afib, nonspecific STT changes  CKD stage III -Baseline creatinine 1.4-1.6 -A.m. BMP  Urothelial carcinoma--bladder and right ureter -Status post right nephrectomy October 12, 2016--Dr. Manny  Chronic atrial fibrillation -CHADSVASc = 5 (CHF, HTN, Age, ASVD) -not on AC due prior massive GI bleed -Rate controlled -Continue metoprolol tartrate -Personally reviewed EKG--atrial fibrillation, nonspecific ST-T wave changes -son okay with ASA 81 mg once acute medical issues resolves  Leg pain and edema -venous duplex  Hypothyroidism -Continue levothyroxine     Disposition Plan:   Home in 2-3 days  Family Communication:  Son updated on phone--Total time spent 40 minutes.  Greater than 50% spent face to face counseling and coordinating care. -0800 to 0840   Consultants:  urology  Code Status: DNR  DVT Prophylaxis:  Lovenox [place]   Procedures: As Listed in Progress Note Above  Antibiotics: vanco 10/30>>> Ceftriaxone 10/29>>>    Subjective: Patient complains of left flank pain with some nausea.  He denies any fevers, chills, chest pain, shortness breath, cough, hemoptysis, headache, neck pain.  He has some dysuria.  He denies any diarrhea, hematochezia, melena  Objective: Vitals:   05/08/17 0400 05/08/17 0430 05/08/17 0500 05/08/17 0616  BP: 123/82 125/80 (!) 157/95 139/78  Pulse: (!) 105 98 (!) 102 83  Resp: (!) 21 (!) 22 (!) 21 16  Temp:    98.9 F (37.2 C)  TempSrc:    Oral  SpO2: 95% 96% 95% 100%  Weight:    69.9 kg (154 lb  3.2 oz)  Height:    5\' 10"  (1.778 m)    Intake/Output Summary (Last 24 hours) at 05/08/17 0650 Last data filed at 05/08/17  0430  Gross per 24 hour  Intake               50 ml  Output                0 ml  Net               50 ml   Weight change:  Exam:   General:  Pt is alert, follows commands appropriately, not in acute distress  HEENT: No icterus, No thrush, No neck mass, [place]/AT  Cardiovascular: RRR, S1/S2, no rubs, no gallops  Respiratory: CTA bilaterally, no wheezing, no crackles, no rhonchi  Abdomen: Soft/+BS, non tender, non distended, no guarding Extremities: Bilateral lower extremity erythema, right greater than left.  Bilateral lower extremity edema.  See pictures below         Data Reviewed: I have personally reviewed following labs and imaging studies Basic Metabolic Panel:  Recent Labs Lab 05/08/17 0220  NA 137  K 4.0  CL 103  CO2 24  GLUCOSE 108*  BUN 28*  CREATININE 1.63*  CALCIUM 8.5*   Liver Function Tests:  Recent Labs Lab 05/08/17 0220  AST 22  ALT 10*  ALKPHOS 70  BILITOT 1.4*  PROT 8.2*  ALBUMIN 3.1*   No results for input(s): LIPASE, AMYLASE in the last 168 hours.  Recent Labs Lab 05/08/17 0220  AMMONIA <9*   Coagulation Profile: No results for input(s): INR, PROTIME in the last 168 hours. CBC:  Recent Labs Lab 05/08/17 0220  WBC 7.0  NEUTROABS 5.2  HGB 12.2*  HCT 38.1*  MCV 100.5*  PLT 130*   Cardiac Enzymes:  Recent Labs Lab 05/08/17 0220 05/08/17 0501  TROPONINI 0.09* 0.09*   BNP: Invalid input(s): POCBNP CBG: No results for input(s): GLUCAP in the last 168 hours. HbA1C: No results for input(s): HGBA1C in the last 72 hours. Urine analysis:    Component Value Date/Time   COLORURINE YELLOW 05/08/2017 0220   APPEARANCEUR CLOUDY (A) 05/08/2017 0220   LABSPEC 1.013 05/08/2017 0220   PHURINE 6.0 05/08/2017 0220   GLUCOSEU NEGATIVE 05/08/2017 0220   HGBUR LARGE (A) 05/08/2017 0220   BILIRUBINUR NEGATIVE 05/08/2017 [place] 05/08/2017 0220   PROTEINUR 100 (A) 05/08/2017 0220   NITRITE NEGATIVE 05/08/2017  0220   LEUKOCYTESUR LARGE (A) 05/08/2017 0220   Sepsis Labs: @LABRCNTIP (procalcitonin:4,lacticidven:4) )No results found for this or any previous visit (from the past 240 hour(s)).   Scheduled Meds: . brimonidine  1 drop Both Eyes TID  . enoxaparin (LOVENOX) injection  30 mg Subcutaneous Q24H  . feeding supplement (ENSURE ENLIVE)  237 mL Oral BID BM  . ferrous sulfate  325 mg Oral BID  . gabapentin  100 mg Oral QHS  . [START ON 05/09/2017] Influenza vac split quadrivalent PF  0.5 mL Intramuscular Tomorrow-1000  . latanoprost  1 drop Both Eyes QHS  . levothyroxine  50 mcg Oral QHS  . metoprolol tartrate  12.5 mg Oral TID  . tamsulosin  0.4 mg Oral QPC supper   Continuous Infusions: . sodium chloride 88 mL/hr at 05/08/17 0621  . [START ON 05/09/2017] cefTRIAXone (ROCEPHIN)  IV      Procedures/Studies: Ct Renal Stone Study  Result Date: 05/08/2017 CLINICAL DATA:  83 year old with left flank pain status post stent placement. EXAM: CT ABDOMEN  AND PELVIS WITHOUT CONTRAST TECHNIQUE: Multidetector CT imaging of the abdomen and pelvis was performed following the standard protocol without IV contrast. COMPARISON:  None. FINDINGS: Lower chest: Multi chamber cardiomegaly. Coronary artery and mitral annulus calcifications. Incidental bilateral gynecomastia. Streaky opacities at the lung bases, partially obscured by motion artifact. No pleural fluid. Hepatobiliary: No focal hepatic lesion allowing for lack contrast. Gallbladder physiologically distended, no calcified stone. No biliary dilatation. Pancreas: Age related parenchymal atrophy. No ductal dilatation or inflammation. Spleen: Slightly small in size. Adrenals/Urinary Tract: Low-density thickening of both adrenal glands consistent with adenomas. Post right nephrectomy. Left ureteral stent in place. Distal pigtail in the urinary bladder. Proximal pigtail within dilated proximal ureter/extrarenal pelvis. Left ureter is dilated throughout its  course, despite stent placement. No evidence pararenal hematoma. No urolithiasis. Lobular left renal contours. 8 mm partially exophytic hyperdense lesion from the upper kidney is incompletely characterized without IV contrast. Urinary bladder is minimally distended with mild wall thickening. Mild perivesicular soft tissue stranding. Stomach/Bowel: Suboptimal bowel assessment without enteric contrast. Stomach is decompressed. Small bowel is nondistended. No bowel obstruction. Diverticulosis of the descending and sigmoid colon without diverticulitis. Moderate colonic stool burden. Appendix tentatively but not confidently visualized. Vascular/Lymphatic: Dense aortic and branch atherosclerosis. No aneurysm. No bulky adenopathy. Reproductive: Prominent prostate gland spans 5 cm transverse. There is soft tissue density with both inguinal canals. Other: Small amount of simple free fluid in the pelvis. No free air. No evidence loculated abscess. Small fat containing supraumbilical ventral abdominal wall hernia without bowel involvement. Musculoskeletal: Advanced multilevel degenerative change throughout spine. Remote appearing compression fracture of T11. Remote right posterior rib fracture. No blastic or destructive osseous lesion. IMPRESSION: 1. Left ureteral stent in place. There is hydroureter despite stent placement. Dilatation of the left renal pelvis with question of extrarenal pelvis configuration. No large pararenal hematoma. There are no prior exams available for comparison. 2. Subcentimeter hyperdense lesion from the upper left kidney. Recommend correlation with prior outside imaging to evaluate for stability. 3. Minimally distended bladder with mild bladder wall thickening and perivesicular soft tissue edema, can be seen with urinary tract infection. 4. Advanced aortic atherosclerosis. Aortic Atherosclerosis (ICD10-I70.0). Coronary artery calcifications. 5. Additional incidental findings include cardiomegaly and  coronary artery calcifications. Distal colonic diverticulosis. Fat containing ventral abdominal wall hernia. 6. Additional findings as described. Electronically Signed   By: Jeb Levering M.D.   On: 05/08/2017 03:21    Amayia Ciano, DO  [facility] Pager [phone]  If 7PM-7AM, please contact night-coverage [email or web address] Password [email or web address] 05/08/2017, 6:50 AM   LOS: 0 days

## 2017-05-08 NOTE — H&P (Signed)
History and Physical    Austin Jackson DXI:[id number] DOB: 1920-04-29 DOA: 05/08/2017  PCP: Neale Burly, MD   Patient coming from: Home.  I have personally briefly reviewed patient's old medical records in [place]  Chief Complaint: Altered mental status.  HPI: Austin Jackson is a 83 y.o. male with medical history significant of bladder cancer, BPH, urolithiasis, renal cell carcinoma, right nephroureterectomy in January this year with Dr. Tresa Moore, left hydronephrosis with left ureteral stent placement, unspecified mild CHF, chronic atrial fibrillation, stage III chronic kidney disease, GERD, glaucoma, macular degeneration, history of GI bleed, hypertension, amiodarone-induced hypothyroidism, iron deficiency anemia, osteoarthritis who was brought to the emergency department via EMS after his wife had to call for the ambulance reporting that the patient was hearing people outside talking about his kids, going around the house and at some point he believes that there were inside his home. He apparently looked for his firearm to confront who he thought was trespassing his property. This is when his spouse decided to call EMS. She also mentioned to EMS that he has not been urinating as much as usual. He denies headache, fever, chills, rhinorrhea, sore throat, dyspnea, productive cough, chest pain, palpitations, dizziness, diaphoresis, PND, orthopnea, but states that he takes furosemide for lower extremity edema. No abdominal pain, nausea, emesis, diarrhea, constipation, melena or hematochezia. He complains of difficulty initiating urination, left flank pain, but denies dysuria, frequency or gross hematuria.  ED Course: When seen, the patient was fully oriented to self, place, time, date and situation. His initial vital signs pulse 95, respiration 19, 142/79 mmHg and O2 sat 98% room air. He received Rocephin 1 g IV PB.  His workup showed urinary analysis with large hemoglobin and leukocyte  esterase, TNTC WBC and RBC on microscopic exam. WBC 7.0 with 75% neutrophils, hemoglobin 12.2 g/dL and platelets 130. BUN of 28, creatinine 1.63, glucose 110 total bilirubin 1.4 mg/dL. The rest of the CMP was normal. Ammonia level was 9. Troponin was 0.09 ng/mL.  Review of Systems: As per HPI otherwise 10 point review of systems negative.    Past Medical History:  Diagnosis Date  . Bilateral lower extremity edema   . Bladder cancer ([place])   . CHF (congestive heart failure) (HCC)    mild  . Chronic atrial fibrillation ([place])    dx 2002--  s/p cardioversion approx 2006 or 2007-- per pt last cardiologist visit at that time-- currently be followed by pcp dr Theodis Blaze in [place]  . CKD (chronic kidney disease), stage III ([place])   . Dysrhythmia   . Full dentures   . GERD (gastroesophageal reflux disease)   . Glaucoma, both eyes   . Gross hematuria    intermittent  . History of BPH   . History of GI bleed    2013--  per pt unable to find reason but had been on blood thinner,  was transfusion's  . History of kidney stones   . Hypertension   . Hypothyroidism due to amiodarone   . Iron deficiency anemia   . Macular degeneration of both eyes   . OA (osteoarthritis)   . PONV (postoperative nausea and vomiting)   . Renal mass, right    renal pelvis    Past Surgical History:  Procedure Laterality Date  . CARDIOVERSION  2002  . COLONOSCOPY  yrs ago  . CYSTOSCOPY WITH RETROGRADE PYELOGRAM, URETEROSCOPY AND STENT PLACEMENT Left 07/19/2016   Procedure: CYSTOSCOPY WITH RETROGRADE PYELOGRAM,  AND STENT PLACEMENT;  Surgeon: [name], MD;  Location: [facility];  Service: Urology;  Laterality: Left;  . CYSTOSCOPY WITH RETROGRADE PYELOGRAM, URETEROSCOPY AND STENT PLACEMENT Left 01/24/2017   Procedure: CYSTOSCOPY WITH RETROGRADE PYELOGRAM, AND STENT REPLACEMENT;  Surgeon: [name], MD;  [facility];  Service: Urology;  Laterality: Left;  . CYSTOSCOPY/RETROGRADE/URETEROSCOPY Bilateral  09/15/2016   Procedure: CYSTOSCOPY/ RETROGRADE/DIAGNOSITC URETEROSCOPY/URETERAL STENT EXCHANGE;  Surgeon: [name], MD;  Location: [facility];  Service: Urology;  Laterality: Bilateral;  . EXTRACORPOREAL SHOCK WAVE LITHOTRIPSY  1970's  . EYE SURGERY Left    for glaucoma  . EYE SURGERY Bilateral    cataract extraction with IOL  . INGUINAL HERNIA REPAIR Bilateral 2007 and 1980's  . ROBOT ASSITED LAPAROSCOPIC NEPHROURETERECTOMY Right 10/12/2016   Procedure: XI ROBOT ASSITED LAPAROSCOPIC NEPHROURETERECTOMY;  Surgeon: [name], MD;  [facility];  Service: Urology;  Laterality: Right;  . TRANSURETHRAL RESECTION OF BLADDER TUMOR Right 07/12/2016   Procedure: TRANSURETHRAL RESECTION OF BLADDER TUMOR (TURBT)/ BILATERAL RETROGRADE PYLEOGRAM/ RIGHT DIAGNOSTIC URETEROSCOPY;  Surgeon: [name], MD;  Location: [facility];  Service: Urology;  Laterality: Right;  . TRANSURETHRAL RESECTION OF PROSTATE  2002     reports that he quit smoking about 32 years ago. His smoking use included Cigarettes. He has a 60.00 pack-year smoking history. He has never used smokeless tobacco. He reports that he drinks alcohol. He reports that he does not use drugs.  Allergies  Allergen Reactions  . Codeine Nausea And Vomiting  . Ultram [Tramadol Hcl] Other (See Comments)    Confusion     Family History  Problem Relation Age of Onset  . Stroke Mother   . Heart disease Father   . Heart disease Brother   . Heart disease Paternal Grandfather   . Heart disease Brother   . Kidney cancer Neg Hx   . Diabetes Mellitus II Neg Hx   . CAD Neg Hx     Prior to Admission medications   Medication Sig Start Date End Date Taking? Authorizing Provider  gabapentin (NEURONTIN) 100 MG capsule Take 100 mg by mouth at bedtime.   Yes [provider]  losartan (COZAAR) 25 MG tablet Take 25 mg by mouth daily.   Yes [provider]  acetaminophen (TYLENOL) 500 MG tablet Take 1,000 mg by mouth  every 12 (twelve) hours. 6 am and 6 pm    [provider]  ALPHAGAN P 0.1 % SOLN Place 1 drop into both eyes 3 (three) times daily.  07/01/16   [provider]  calcium carbonate (TUMS EX) 750 MG chewable tablet Chew 750 tablets by mouth 2 (two) times daily as needed for heartburn.    [provider]  cephALEXin (KEFLEX) 500 MG capsule Take 1 capsule (500 mg total) by mouth 3 (three) times daily. 03/31/17   Lajean Saver, MD  Ferrous Sulfate (IRON) 325 (65 Fe) MG TABS Take 325 mg by mouth 2 (two) times daily.     [provider]  furosemide (LASIX) 20 MG tablet Take 20 mg by mouth 2 (two) times daily.     [provider]  latanoprost (XALATAN) 0.005 % ophthalmic solution Place 1 drop into both eyes at bedtime.     [provider]  levothyroxine (SYNTHROID, LEVOTHROID) 50 MCG tablet Take 50 mcg by mouth at bedtime.    [provider]  metoprolol tartrate (LOPRESSOR) 25 MG tablet Take 12.5 mg by mouth 3 (three) times daily.     [provider]  tamsulosin (FLOMAX) 0.4 MG CAPS capsule Take 0.4 mg by mouth daily after supper.    [provider]    Physical Exam: Vitals:   05/08/17 0330 05/08/17 0400 05/08/17 0430 05/08/17 0500  BP: 118/76 123/82 125/80 (!) 157/95  Pulse: (!) 101 (!) 105 98 (!) 102  Resp:  (!) 21 (!) 22 (!) 21  SpO2: 95% 95% 96% 95%    Constitutional: NAD, calm, comfortable Eyes: PERRL, lids and conjunctivae normal ENMT: Mucous membranes are moist. Posterior pharynx clear of any exudate or lesions. Dentures. Neck: Normal, supple, no masses, no thyromegaly. Respiratory: Clear to auscultation bilaterally, no wheezing, no crackles. Normal respiratory effort. No accessory muscle use.  Cardiovascular: Irregularly irregular, no murmurs / rubs / gallops. No extremity edema. 2+ pedal pulses. No carotid bruits.  Abdomen: Soft, positive suprapubic tenderness, left CVA tenderness, no guarding/rebound/masses  palpated. No hepatosplenomegaly. Bowel sounds positive.  Musculoskeletal: no clubbing / cyanosis. Good ROM, no contractures. Normal muscle tone.  Skin: Left lower extremities dressings. Patient has some minor abrasions on lower back. Neurologic: CN 2-12 grossly intact. Sensation intact, DTR normal. Strength 5/5 in all 4.  Psychiatric: Normal judgment and insight. Alert and oriented x 4. Normal mood.    Labs on Admission: I have personally reviewed following labs and imaging studies  CBC:  Recent Labs Lab 05/08/17 0220  WBC 7.0  NEUTROABS 5.2  HGB 12.2*  HCT 38.1*  MCV 100.5*  PLT 373*   Basic Metabolic Panel:  Recent Labs Lab 05/08/17 0220  NA 137  K 4.0  CL 103  CO2 24  GLUCOSE 108*  BUN 28*  CREATININE 1.63*  CALCIUM 8.5*   GFR: CrCl cannot be calculated (Unknown ideal weight.). Liver Function Tests:  Recent Labs Lab 05/08/17 0220  AST 22  ALT 10*  ALKPHOS 70  BILITOT 1.4*  PROT 8.2*  ALBUMIN 3.1*   No results for input(s): LIPASE, AMYLASE in the last 168 hours.  Recent Labs Lab 05/08/17 0220  AMMONIA <9*   Coagulation Profile: No results for input(s): INR, PROTIME in the last 168 hours. Cardiac Enzymes:  Recent Labs Lab 05/08/17 0220  TROPONINI 0.09*   BNP (last 3 results) No results for input(s): PROBNP in the last 8760 hours. HbA1C: No results for input(s): HGBA1C in the last 72 hours. CBG: No results for input(s): GLUCAP in the last 168 hours. Lipid Profile: No results for input(s): CHOL, HDL, LDLCALC, TRIG, CHOLHDL, LDLDIRECT in the last 72 hours. Thyroid Function Tests: No results for input(s): TSH, T4TOTAL, FREET4, T3FREE, THYROIDAB in the last 72 hours. Anemia Panel: No results for input(s): VITAMINB12, FOLATE, FERRITIN, TIBC, IRON, RETICCTPCT in the last 72 hours. Urine analysis:    Component Value Date/Time   COLORURINE YELLOW 05/08/2017 0220   APPEARANCEUR CLOUDY (A) 05/08/2017 0220   LABSPEC 1.013 05/08/2017 0220    PHURINE 6.0 05/08/2017 0220   GLUCOSEU NEGATIVE 05/08/2017 0220   HGBUR LARGE (A) 05/08/2017 0220   BILIRUBINUR NEGATIVE 05/08/2017 0220   KETONESUR NEGATIVE 05/08/2017 0220   PROTEINUR 100 (A) 05/08/2017 0220   NITRITE NEGATIVE 05/08/2017 0220   LEUKOCYTESUR LARGE (A) 05/08/2017 0220    Radiological Exams on Admission: Ct Renal Stone Study  Result Date: 05/08/2017 CLINICAL DATA:  83 year old with left flank pain status post stent placement. EXAM: CT ABDOMEN AND PELVIS WITHOUT CONTRAST TECHNIQUE: Multidetector CT imaging of the abdomen and pelvis was performed following the standard protocol without IV contrast. COMPARISON:  None. FINDINGS: Lower chest: Multi chamber cardiomegaly. Coronary artery  and mitral annulus calcifications. Incidental bilateral gynecomastia. Streaky opacities at the lung bases, partially obscured by motion artifact. No pleural fluid. Hepatobiliary: No focal hepatic lesion allowing for lack contrast. Gallbladder physiologically distended, no calcified stone. No biliary dilatation. Pancreas: Age related parenchymal atrophy. No ductal dilatation or inflammation. Spleen: Slightly small in size. Adrenals/Urinary Tract: Low-density thickening of both adrenal glands consistent with adenomas. Post right nephrectomy. Left ureteral stent in place. Distal pigtail in the urinary bladder. Proximal pigtail within dilated proximal ureter/extrarenal pelvis. Left ureter is dilated throughout its course, despite stent placement. No evidence pararenal hematoma. No urolithiasis. Lobular left renal contours. 8 mm partially exophytic hyperdense lesion from the upper kidney is incompletely characterized without IV contrast. Urinary bladder is minimally distended with mild wall thickening. Mild perivesicular soft tissue stranding. Stomach/Bowel: Suboptimal bowel assessment without enteric contrast. Stomach is decompressed. Small bowel is nondistended. No bowel obstruction. Diverticulosis of the  descending and sigmoid colon without diverticulitis. Moderate colonic stool burden. Appendix tentatively but not confidently visualized. Vascular/Lymphatic: Dense aortic and branch atherosclerosis. No aneurysm. No bulky adenopathy. Reproductive: Prominent prostate gland spans 5 cm transverse. There is soft tissue density with both inguinal canals. Other: Small amount of simple free fluid in the pelvis. No free air. No evidence loculated abscess. Small fat containing supraumbilical ventral abdominal wall hernia without bowel involvement. Musculoskeletal: Advanced multilevel degenerative change throughout spine. Remote appearing compression fracture of T11. Remote right posterior rib fracture. No blastic or destructive osseous lesion. IMPRESSION: 1. Left ureteral stent in place. There is hydroureter despite stent placement. Dilatation of the left renal pelvis with question of extrarenal pelvis configuration. No large pararenal hematoma. There are no prior exams available for comparison. 2. Subcentimeter hyperdense lesion from the upper left kidney. Recommend correlation with prior outside imaging to evaluate for stability. 3. Minimally distended bladder with mild bladder wall thickening and perivesicular soft tissue edema, can be seen with urinary tract infection. 4. Advanced aortic atherosclerosis. Aortic Atherosclerosis (ICD10-I70.0). Coronary artery calcifications. 5. Additional incidental findings include cardiomegaly and coronary artery calcifications. Distal colonic diverticulosis. Fat containing ventral abdominal wall hernia. 6. Additional findings as described. Electronically Signed   By: Jeb Levering M.D.   On: 05/08/2017 03:21    EKG: Independently reviewed.  Vent. rate 106 BPM PR interval * ms QRS duration 98 ms QT/QTc 339/451 ms P-R-T axes * 117 46 Atrial flutter with predominant 3:1 AV block Probable right ventricular hypertrophy Baseline wander Not significantly changed since previous  EKG.  Assessment/Plan Principal Problem:   Urinary tract infection in male I clearly the cause of altered mental status. Admitted to telemetry as/inpatient. Continue gentle and time-limited IV fluids. Continue ceftriaxone 1 g IV PB every 24 hours. Follow-up urine culture and sensitivity.  Active Problems:   Altered mental status This seems to be transient. The patient remembers hearing people outside and thinking that someone was inside his home. He is currently fully AAO4. Denies hearing any voices.    Normocytic normochromic anemia Check anemia panel. Monitor hematocrit and hemoglobin    Hypothyroidism Continue levothyroxine 50 g by mouth daily.    Hypertension Continue metoprolol 12.5 mg by mouth 3 times a day. Hold furosemide and losartan until the patient gets rehydrated. Follow-up blood pressure, BUN, creatinine and electrolytes.    Chronic renal disease, stage III (HCC) Creatinine seems to be around baseline. Monitor renal function and electrolytes.    Glaucoma, both eyes Continue Alphagan and Xalatan drops.    Chronic atrial fibrillation (HCC) CHA?DS?-VASc Score of at  least 4. Not on anticoagulation. Continue metoprolol for rate control.   DVT prophylaxis: Lovenox SQ. Code Status: DO NOT RESUSCITATE. Family Communication:  Disposition Plan: Admit for IV antibiotic therapy for 2-3 days. Consults called:  Admission status: Inpatient/Telemetry.   [name] MD [facility] Pager [phone].  If 7PM-7AM, please contact night-coverage [email or web address] Password [facility]  05/08/2017, 6:03 AM    This document was prepared with dragon voice recognition software and and may have some unintended errors.

## 2017-05-08 NOTE — ED Triage Notes (Signed)
EMS called out by wife stating pt was going thru the house with gun stating someone was in the house, wife reports altered mental status starting tonight, states pt was seeing people in the house and around the house and called [place] [employer], upon arrival EMS states pt was a&o x 4, wife also reports decreased urination x cpl wks and left flank pain, pt had right kidney removed 04/18

## 2017-05-08 NOTE — Progress Notes (Signed)
Pharmacy Antibiotic Note  VRISHANK MOSTER is a 83 y.o. male admitted on 05/08/2017 with cellulitis.  Pharmacy has been consulted for VANCOMYCIN dosing.  Plan: Vancomycin 1000mg  x 1 then 500mg  IV q24hrs (renally adjusted) Check trough level at steady state Monitor labs, progress, c/s  Height: 5\' 10"  (177.8 cm) Weight: 154 lb 3.2 oz (69.9 kg) IBW/kg (Calculated) : 73  Temp (24hrs), Avg:98.9 F (37.2 C), Min:98.9 F (37.2 C), Max:98.9 F (37.2 C)   Recent Labs Lab 05/08/17 0220  WBC 7.0  CREATININE 1.63*    Estimated Creatinine Clearance: 26.8 mL/min (A) (by C-G formula based on SCr of 1.63 mg/dL (H)).    Allergies  Allergen Reactions  . Codeine Nausea And Vomiting  . Ultram [Tramadol Hcl] Other (See Comments)    Confusion    Antimicrobials this admission: Vancomycin 10/30 >>  Rocephin 10/30 >>   Dose adjustments this admission:  Microbiology results: 10/30 UCx: pending   Thank you for allowing pharmacy to be a part of this patient's care.  Hart Robinsons A 05/08/2017 10:43 AM

## 2017-05-08 NOTE — Consult Note (Signed)
Urology Consult  Consulting PY:[id number] cTat  History: 83 year old male with solitary left kidney, with an indwelling left ureteral stent, last changed in July of this year.  He is admitted with altered mental status, earlier this morning.  CT abdomen and pelvis performed in the emergency room revealed left hydronephrosis with adequately positioned stent.  He is currently being treated for urinary tract infection.  The patient was confused this morning, is less confused at the present time.  He denies abdominal or flank pain.  He has had no fever or chills.  Because of radiographic findings, urologic consultation is requested.    PMH: Past Medical History:  Diagnosis Date  . Bilateral lower extremity edema   . Bladder cancer ([place])   . CHF (congestive heart failure) (HCC)    mild  . Chronic atrial fibrillation ([place])    dx 2002--  s/p cardioversion approx 2006 or 2007-- per pt last cardiologist visit at that time-- currently be followed by pcp dr Theodis Blaze in [place]  . CKD (chronic kidney disease), stage III ([place])   . Dysrhythmia   . Full dentures   . GERD (gastroesophageal reflux disease)   . Glaucoma, both eyes   . Gross hematuria    intermittent  . History of BPH   . History of GI bleed    2013--  per pt unable to find reason but had been on blood thinner,  was transfusion's  . History of kidney stones   . Hypertension   . Hypothyroidism due to amiodarone   . Iron deficiency anemia   . Macular degeneration of both eyes   . OA (osteoarthritis)   . PONV (postoperative nausea and vomiting)   . Renal mass, right    renal pelvis    PSH: Past Surgical History:  Procedure Laterality Date  . CARDIOVERSION  2002  . COLONOSCOPY  yrs ago  . CYSTOSCOPY WITH RETROGRADE PYELOGRAM, URETEROSCOPY AND STENT PLACEMENT Left 07/19/2016   Procedure: CYSTOSCOPY WITH RETROGRADE PYELOGRAM,  AND STENT PLACEMENT;  Surgeon: [name], MD;  [facility];  Service: Urology;  Laterality:  Left;  . CYSTOSCOPY WITH RETROGRADE PYELOGRAM, URETEROSCOPY AND STENT PLACEMENT Left 01/24/2017   Procedure: CYSTOSCOPY WITH RETROGRADE PYELOGRAM, AND STENT REPLACEMENT;  Surgeon: [name], MD;  [facility];  Service: Urology;  Laterality: Left;  . CYSTOSCOPY/RETROGRADE/URETEROSCOPY Bilateral 09/15/2016   Procedure: CYSTOSCOPY/ RETROGRADE/DIAGNOSITC URETEROSCOPY/URETERAL STENT EXCHANGE;  Surgeon: [name], MD;  [facility];  Service: Urology;  Laterality: Bilateral;  . EXTRACORPOREAL SHOCK WAVE LITHOTRIPSY  1970's  . EYE SURGERY Left    for glaucoma  . EYE SURGERY Bilateral    cataract extraction with IOL  . INGUINAL HERNIA REPAIR Bilateral 2007 and 1980's  . ROBOT ASSITED LAPAROSCOPIC NEPHROURETERECTOMY Right 10/12/2016   Procedure: XI ROBOT ASSITED LAPAROSCOPIC NEPHROURETERECTOMY;  Surgeon: [name], MD;  [facility];  Service: Urology;  Laterality: Right;  . TRANSURETHRAL RESECTION OF BLADDER TUMOR Right 07/12/2016   Procedure: TRANSURETHRAL RESECTION OF BLADDER TUMOR (TURBT)/ BILATERAL RETROGRADE PYLEOGRAM/ RIGHT DIAGNOSTIC URETEROSCOPY;  Surgeon: [name], MD;  Location: [facility];  Service: Urology;  Laterality: Right;  . TRANSURETHRAL RESECTION OF PROSTATE  2002    Allergies: Allergies  Allergen Reactions  . Codeine Nausea And Vomiting  . Ultram [Tramadol Hcl] Other (See Comments)    Confusion     Medications: Prescriptions Prior to Admission  Medication Sig Dispense Refill Last Dose  . acetaminophen (TYLENOL) 500 MG tablet Take 1,000 mg by mouth every  12 (twelve) hours. 6 am and 6 pm   05/08/2017 at Unknown time  . ALPHAGAN P 0.1 % SOLN Place 1 drop into both eyes 3 (three) times daily.   5 05/07/2017 at Unknown time  . calcium carbonate (TUMS EX) 750 MG chewable tablet Chew 750 tablets by mouth 2 (two) times daily as needed for heartburn.   Past Month at Unknown time  . Ferrous Sulfate (IRON) 325 (65 Fe) MG TABS Take 325 mg by  mouth 2 (two) times daily.    05/08/2017 at Unknown time  . furosemide (LASIX) 20 MG tablet Take 20 mg by mouth 2 (two) times daily.    05/08/2017 at Unknown time  . latanoprost (XALATAN) 0.005 % ophthalmic solution Place 1 drop into both eyes at bedtime.    05/07/2017 at Unknown time  . levothyroxine (SYNTHROID, LEVOTHROID) 50 MCG tablet Take 50 mcg by mouth at bedtime.   05/07/2017 at Unknown time  . losartan (COZAAR) 25 MG tablet Take 25 mg by mouth daily.   05/07/2017 at Unknown time  . metoprolol tartrate (LOPRESSOR) 25 MG tablet Take 12.5 mg by mouth 3 (three) times daily.    05/07/2017 at 1730  . tamsulosin (FLOMAX) 0.4 MG CAPS capsule Take 0.4 mg by mouth daily after supper.   05/07/2017 at Unknown time     Social History: Social History   Social History  . Marital status: Married    Spouse name: N/A  . Number of children: N/A  . Years of education: N/A   Occupational History  . Not on file.   Social History Main Topics  . Smoking status: Former Smoker    Packs/day: 2.00    Years: 30.00    Types: Cigarettes    Quit date: 07/07/1984  . Smokeless tobacco: Never Used  . Alcohol use Yes     Comment: occasional beer  . Drug use: No  . Sexual activity: Not on file   Other Topics Concern  . Not on file   Social History Narrative  . No narrative on file    Family History: Family History  Problem Relation Age of Onset  . Stroke Mother   . Heart disease Father   . Heart disease Brother   . Heart disease Paternal Grandfather   . Heart disease Brother   . Kidney cancer Neg Hx   . Diabetes Mellitus II Neg Hx   . CAD Neg Hx     Review of Systems: Positive: Confusion Negative:  A further 10 point review of systems was negative except what is listed in the HPI.  Physical Exam: @VITALS2 @ General: No acute distress.  Awake responds to questions appropriately Head:  Normocephalic.  Atraumatic. ENT:  EOMI.  Mucous membranes moist Neck:  Supple.  . CV:              Regular rate. Pulmonary: Equal effort bilaterally.  Clear to auscultation bilaterally. Skin:  Normal turgor.  No visible rash. Extremity: No gross deformity of bilateral upper extremities.  No gross deformity of    bilateral lower extremities. Neurologic: Alert. Appropriate mood.    Studies:  Recent Labs     05/08/17  0220  HGB  12.2*  WBC  7.0  PLT  130*    Recent Labs     05/08/17  0220  NA  137  K  4.0  CL  103  CO2  24  BUN  28*  CREATININE  1.63*  CALCIUM  8.5*  GFRNONAA  34*  GFRAA  40*     No results for input(s): INR, APTT in the last 72 hours.  Invalid input(s): PT   Invalid input(s): ABG   I have independently reviewed the patient's CT images, both performed recently as well as previously.  This reveals reviewed as well. stable left hydronephrosis. Laboratories were reviewed.  Assessment:  Stable left-sided hydronephrosis. With one kidney, the patient has stable urinary function.  I do not think stent exchanged is ecessary at the present time  Plan:  1.  I do not think the patient needs to have his stent changed at this time.  Follow-up with Dr. Tedd Sias planned  2. Treat UTI appropriately once cultures return    Pager:[phone]

## 2017-05-08 NOTE — ED Notes (Signed)
Report to Brandy, RN [id number] 

## 2017-05-08 NOTE — ED Provider Notes (Signed)
[facility] Provider Note   CSN: [id number] Arrival date & time: 05/08/17  0119  Time seen 01:40 AM  History   Chief Complaint Chief Complaint  Patient presents with  . Altered Mental Status    HPI Austin Jackson is a 83 y.o. male.  HPI was sent to the emergency department by EMS, his wife states that he was seeing people in the house and around the house and she also was concerned because he was not urinating as much as normal.  Patient states he had not had much sleep because his furnace was not working and he was up to 4 in the morning working on that.  He states he is also upset because somebody came to their door and his wife let them come in.  He states that is very unsafe in this day and age.  He also states he has very acute hearing and he can hear conversation on the street outside the house.  He states the conversations are mainly about their children and then they separate and go their own way.  He states he has had left side pain for a couple weeks, nothing makes it feel worse, nothing makes it feel better.  He denies nausea, vomiting, or diarrhea.  He states he has chronic constipation.  He states he has been noticing he is having trouble urinating and that his stream is slower than usual.  He states he is eating okay although he states his wife thinks he is not.  Patient had a right nephrectomy done earlier this year for cancer done by Dr. Tresa Moore. He has a stent in the ureter of his left kidney.  He is noted to have a bandage on his left lower leg which she states is from blisters that break apart and drained clear fluid.  He denies any recent injury.  Patient is fully alert and oriented, he knows where he is the day of the month of the year.  PCP Neale Burly, MD Urology Dr Tresa Moore  Past Medical History:  Diagnosis Date  . Bilateral lower extremity edema   . Bladder cancer ([place])   . CHF (congestive heart failure) (HCC)    mild  . Chronic atrial  fibrillation ([place])    dx 2002--  s/p cardioversion approx 2006 or 2007-- per pt last cardiologist visit at that time-- currently be followed by pcp dr Theodis Blaze in [place]  . CKD (chronic kidney disease), stage III ([place])   . Dysrhythmia   . Full dentures   . GERD (gastroesophageal reflux disease)   . Glaucoma, both eyes   . Gross hematuria    intermittent  . History of BPH   . History of GI bleed    2013--  per pt unable to find reason but had been on blood thinner,  was transfusion's  . History of kidney stones   . Hypertension   . Hypothyroidism due to amiodarone   . Iron deficiency anemia   . Macular degeneration of both eyes   . OA (osteoarthritis)   . PONV (postoperative nausea and vomiting)   . Renal mass, right    renal pelvis    Patient Active Problem List   Diagnosis Date Noted  . Urinary tract infection in male 05/08/2017  . Urothelial carcinoma of kidney, right ([place]) 10/12/2016  . Acute renal failure (ARF) ([place]) 07/19/2016  . Hydronephrosis 07/19/2016  . Normocytic normochromic anemia 07/19/2016  . Hypothyroidism 07/19/2016  . Hypertension 07/19/2016  .  Acute renal failure ([place]) 07/19/2016  . SIRS (systemic inflammatory response syndrome) ([place]) 07/19/2016  . Hyperkalemia 21-Sep-202018  . Bladder cancer ([place]) 07/12/2016    Past Surgical History:  Procedure Laterality Date  . CARDIOVERSION  2002  . COLONOSCOPY  yrs ago  . CYSTOSCOPY WITH RETROGRADE PYELOGRAM, URETEROSCOPY AND STENT PLACEMENT Left 07/19/2016   Procedure: CYSTOSCOPY WITH RETROGRADE PYELOGRAM,  AND STENT PLACEMENT;  Surgeon: [name], MD;  Location: [facility];  Service: Urology;  Laterality: Left;  . CYSTOSCOPY WITH RETROGRADE PYELOGRAM, URETEROSCOPY AND STENT PLACEMENT Left 01/24/2017   Procedure: CYSTOSCOPY WITH RETROGRADE PYELOGRAM, AND STENT REPLACEMENT;  Surgeon: [name], MD;  Location: [facility];  Service: Urology;  Laterality: Left;  . CYSTOSCOPY/RETROGRADE/URETEROSCOPY Bilateral  09/15/2016   Procedure: CYSTOSCOPY/ RETROGRADE/DIAGNOSITC URETEROSCOPY/URETERAL STENT EXCHANGE;  Surgeon: [name], MD;  Location: [facility];  Service: Urology;  Laterality: Bilateral;  . EXTRACORPOREAL SHOCK WAVE LITHOTRIPSY  1970's  . EYE SURGERY Left    for glaucoma  . EYE SURGERY Bilateral    cataract extraction with IOL  . INGUINAL HERNIA REPAIR Bilateral 2007 and 1980's  . ROBOT ASSITED LAPAROSCOPIC NEPHROURETERECTOMY Right 10/12/2016   Procedure: XI ROBOT ASSITED LAPAROSCOPIC NEPHROURETERECTOMY;  Surgeon: [name], MD;  Location: [facility];  Service: Urology;  Laterality: Right;  . TRANSURETHRAL RESECTION OF BLADDER TUMOR Right 07/12/2016   Procedure: TRANSURETHRAL RESECTION OF BLADDER TUMOR (TURBT)/ BILATERAL RETROGRADE PYLEOGRAM/ RIGHT DIAGNOSTIC URETEROSCOPY;  Surgeon: [name], MD;  Location: [facility];  Service: Urology;  Laterality: Right;  . TRANSURETHRAL RESECTION OF PROSTATE  2002       Home Medications    Prior to Admission medications   Medication Sig Start Date End Date Taking? Authorizing Provider  gabapentin (NEURONTIN) 100 MG capsule Take 100 mg by mouth at bedtime.   Yes [provider]  losartan (COZAAR) 25 MG tablet Take 25 mg by mouth daily.   Yes [provider]  acetaminophen (TYLENOL) 500 MG tablet Take 1,000 mg by mouth every 12 (twelve) hours. 6 am and 6 pm    [provider]  ALPHAGAN P 0.1 % SOLN Place 1 drop into both eyes 3 (three) times daily.  07/01/16   [provider]  calcium carbonate (TUMS EX) 750 MG chewable tablet Chew 750 tablets by mouth 2 (two) times daily as needed for heartburn.    [provider]  cephALEXin (KEFLEX) 500 MG capsule Take 1 capsule (500 mg total) by mouth 3 (three) times daily. 03/31/17   Lajean Saver, MD  Ferrous Sulfate (IRON) 325 (65 Fe) MG TABS Take 325 mg by mouth 2 (two) times daily.     [provider]  furosemide (LASIX) 20 MG tablet Take  20 mg by mouth 2 (two) times daily.     [provider]  latanoprost (XALATAN) 0.005 % ophthalmic solution Place 1 drop into both eyes at bedtime.     [provider]  levothyroxine (SYNTHROID, LEVOTHROID) 50 MCG tablet Take 50 mcg by mouth at bedtime.    [provider]  metoprolol tartrate (LOPRESSOR) 25 MG tablet Take 12.5 mg by mouth 3 (three) times daily.     [provider]  tamsulosin (FLOMAX) 0.4 MG CAPS capsule Take 0.4 mg by mouth daily after supper.    [provider]    Family History Family History  Problem Relation Age of Onset  . Stroke Mother   . Heart disease Father   . Heart disease Brother   . Heart disease  Paternal Grandfather   . Heart disease Brother   . Kidney cancer Neg Hx   . Diabetes Mellitus II Neg Hx   . CAD Neg Hx     Social History Social History  Substance Use Topics  . Smoking status: Former Smoker    Packs/day: 2.00    Years: 30.00    Types: Cigarettes    Quit date: 07/07/1984  . Smokeless tobacco: Never Used  . Alcohol use Yes     Comment: occasional beer  lives at home Lives with spouse   Allergies   Codeine and Ultram [tramadol hcl]   Review of Systems Review of Systems  All other systems reviewed and are negative.    Physical Exam Updated Vital Signs BP (!) 157/95   Pulse (!) 102   Resp (!) 21   SpO2 95%   Vital signs normal except hypertension and borderline tachycardia   Physical Exam  Constitutional: He is oriented to person, place, and time. He appears well-developed and well-nourished.  Non-toxic appearance. He does not appear ill. No distress.  Pleasant elderly male  HENT:  Head: Normocephalic and atraumatic.  Right Ear: External ear normal.  Left Ear: External ear normal.  Nose: Nose normal. No mucosal edema or rhinorrhea.  Mouth/Throat: Oropharynx is clear and moist and mucous membranes are normal. No dental abscesses or uvula swelling.  Eyes: Pupils are equal,  round, and reactive to light. Conjunctivae and EOM are normal.  Neck: Normal range of motion and full passive range of motion without pain. Neck supple.  Cardiovascular: Normal rate, regular rhythm and normal heart sounds.  Exam reveals no gallop and no friction rub.   No murmur heard. Pulmonary/Chest: Effort normal and breath sounds normal. No respiratory distress. He has no wheezes. He has no rhonchi. He has no rales. He exhibits no tenderness and no crepitus.  Abdominal: Soft. Normal appearance and bowel sounds are normal. He exhibits no distension. There is no tenderness. There is no rebound and no guarding.  PT is very tender in his left flank  Musculoskeletal: Normal range of motion. He exhibits no edema or tenderness.  Moves all extremities well.   Neurological: He is alert and oriented to person, place, and time. He has normal strength. No cranial nerve deficit.  Skin: Skin is warm, dry and intact. No rash noted. No erythema. No pallor.  Psychiatric: He has a normal mood and affect. His speech is normal and behavior is normal. His mood appears not anxious.  Nursing note and vitals reviewed.    ED Treatments / Results  Labs (all labs ordered are listed, but only abnormal results are displayed) Results for orders placed or performed during the hospital encounter of 05/08/17  Comprehensive metabolic panel  Result Value Ref Range   Sodium 137 135 - 145 mmol/L   Potassium 4.0 3.5 - 5.1 mmol/L   Chloride 103 101 - 111 mmol/L   CO2 24 22 - 32 mmol/L   Glucose, Bld 108 (H) 65 - 99 mg/dL   BUN 28 (H) 6 - 20 mg/dL   Creatinine, Ser 1.63 (H) 0.61 - 1.24 mg/dL   Calcium 8.5 (L) 8.9 - 10.3 mg/dL   Total Protein 8.2 (H) 6.5 - 8.1 g/dL   Albumin 3.1 (L) 3.5 - 5.0 g/dL   AST 22 15 - 41 U/L   ALT 10 (L) 17 - 63 U/L   Alkaline Phosphatase 70 38 - 126 U/L   Total Bilirubin 1.4 (H) 0.3 - 1.2 mg/dL  GFR calc non Af Amer 34 (L) >60 mL/min   GFR calc Af Amer 40 (L) >60 mL/min   Anion gap 10 5  - 15  CBC with Differential  Result Value Ref Range   WBC 7.0 4.0 - 10.5 K/uL   RBC 3.79 (L) 4.22 - 5.81 MIL/uL   Hemoglobin 12.2 (L) 13.0 - 17.0 g/dL   HCT 38.1 (L) 39.0 - 52.0 %   MCV 100.5 (H) 78.0 - 100.0 fL   MCH 32.2 26.0 - 34.0 pg   MCHC 32.0 30.0 - 36.0 g/dL   RDW 16.7 (H) 11.5 - 15.5 %   Platelets 130 (L) 150 - 400 K/uL   Neutrophils Relative % 75 %   Neutro Abs 5.2 1.7 - 7.7 K/uL   Lymphocytes Relative 17 %   Lymphs Abs 1.2 0.7 - 4.0 K/uL   Monocytes Relative 8 %   Monocytes Absolute 0.6 0.1 - 1.0 K/uL   Eosinophils Relative 0 %   Eosinophils Absolute 0.0 0.0 - 0.7 K/uL   Basophils Relative 0 %   Basophils Absolute 0.0 0.0 - 0.1 K/uL  Urinalysis, Routine w reflex microscopic  Result Value Ref Range   Color, Urine YELLOW YELLOW   APPearance CLOUDY (A) CLEAR   Specific Gravity, Urine 1.013 1.005 - 1.030   pH 6.0 5.0 - 8.0   Glucose, UA NEGATIVE NEGATIVE mg/dL   Hgb urine dipstick LARGE (A) NEGATIVE   Bilirubin Urine NEGATIVE NEGATIVE   Ketones, ur NEGATIVE NEGATIVE mg/dL   Protein, ur 100 (A) NEGATIVE mg/dL   Nitrite NEGATIVE NEGATIVE   Leukocytes, UA LARGE (A) NEGATIVE   RBC / HPF TOO NUMEROUS TO COUNT 0 - 5 RBC/hpf   WBC, UA TOO NUMEROUS TO COUNT 0 - 5 WBC/hpf   Bacteria, UA FEW (A) NONE SEEN   Squamous Epithelial / LPF NONE SEEN NONE SEEN   Budding Yeast PRESENT   Ammonia  Result Value Ref Range   Ammonia <9 (L) 9 - 35 umol/L  Troponin I  Result Value Ref Range   Troponin I 0.09 (HH) <0.03 ng/mL   Laboratory interpretation all normal except + Troponin, possible UTI, mild anemia, elevated BUN and creatinine consistent with dehydration or chronic renal insufficiency    EKG  EKG Interpretation  Date/Time:  Tuesday May 08 2017 02:15:00 EDT Ventricular Rate:  106 PR Interval:    QRS Duration: 98 QT Interval:  339 QTC Calculation: 451 R Axis:   117 Text Interpretation:  Atrial flutter with predominant 3:1 AV block Probable right ventricular  hypertrophy Baseline wander No significant change since last tracing 18 Jul 2016 Confirmed by Rolland Porter [phone]) on 05/08/2017 2:48:22 AM       Radiology Ct Renal Stone Study  Result Date: 05/08/2017 CLINICAL DATA:  83 year old with left flank pain status post stent placement. EXAM: CT ABDOMEN AND PELVIS WITHOUT CONTRAST TECHNIQUE: Multidetector CT imaging of the abdomen and pelvis was performed following the standard protocol without IV contrast. COMPARISON:  None. FINDINGS: Lower chest: Multi chamber cardiomegaly. Coronary artery and mitral annulus calcifications. Incidental bilateral gynecomastia. Streaky opacities at the lung bases, partially obscured by motion artifact. No pleural fluid. Hepatobiliary: No focal hepatic lesion allowing for lack contrast. Gallbladder physiologically distended, no calcified stone. No biliary dilatation. Pancreas: Age related parenchymal atrophy. No ductal dilatation or inflammation. Spleen: Slightly small in size. Adrenals/Urinary Tract: Low-density thickening of both adrenal glands consistent with adenomas. Post right nephrectomy. Left ureteral stent in place. Distal pigtail in the urinary bladder.  Proximal pigtail within dilated proximal ureter/extrarenal pelvis. Left ureter is dilated throughout its course, despite stent placement. No evidence pararenal hematoma. No urolithiasis. Lobular left renal contours. 8 mm partially exophytic hyperdense lesion from the upper kidney is incompletely characterized without IV contrast. Urinary bladder is minimally distended with mild wall thickening. Mild perivesicular soft tissue stranding. Stomach/Bowel: Suboptimal bowel assessment without enteric contrast. Stomach is decompressed. Small bowel is nondistended. No bowel obstruction. Diverticulosis of the descending and sigmoid colon without diverticulitis. Moderate colonic stool burden. Appendix tentatively but not confidently visualized. Vascular/Lymphatic: Dense aortic and branch  atherosclerosis. No aneurysm. No bulky adenopathy. Reproductive: Prominent prostate gland spans 5 cm transverse. There is soft tissue density with both inguinal canals. Other: Small amount of simple free fluid in the pelvis. No free air. No evidence loculated abscess. Small fat containing supraumbilical ventral abdominal wall hernia without bowel involvement. Musculoskeletal: Advanced multilevel degenerative change throughout spine. Remote appearing compression fracture of T11. Remote right posterior rib fracture. No blastic or destructive osseous lesion. IMPRESSION: 1. Left ureteral stent in place. There is hydroureter despite stent placement. Dilatation of the left renal pelvis with question of extrarenal pelvis configuration. No large pararenal hematoma. There are no prior exams available for comparison. 2. Subcentimeter hyperdense lesion from the upper left kidney. Recommend correlation with prior outside imaging to evaluate for stability. 3. Minimally distended bladder with mild bladder wall thickening and perivesicular soft tissue edema, can be seen with urinary tract infection. 4. Advanced aortic atherosclerosis. Aortic Atherosclerosis (ICD10-I70.0). Coronary artery calcifications. 5. Additional incidental findings include cardiomegaly and coronary artery calcifications. Distal colonic diverticulosis. Fat containing ventral abdominal wall hernia. 6. Additional findings as described. Electronically Signed   By: Jeb Levering M.D.   On: 05/08/2017 03:21    Procedures Procedures (including critical care time)  Medications Ordered in ED Medications  cefTRIAXone (ROCEPHIN) 1 g in dextrose 5 % 50 mL IVPB (0 g Intravenous Stopped 05/08/17 0430)     Initial Impression / Assessment and Plan / ED Course  I have reviewed the triage vital signs and the nursing notes.  Pertinent labs & imaging results that were available during my care of the patient were reviewed by me and considered in my medical  decision making (see chart for details).     Laboratory testing was done to evaluate patient for any acute underlying medical problem.  CT scan was done due to the history of his having the left renal stent and his complaints of flank pain and wife stating he has had decreased urinary output.  CT of the head was not done because patient is fully awake and alert to where he is the month year and day.   After reviewing his laboratory results patient was given Rocephin IV, possibly the UTI could be causing some of the symptoms the wife was concerned about.  He only has one kidney and concern is if he has UTI he could develop pyelonephritis.   04:20 AM Dr Olevia Bowens, will admit patient.    Final Clinical Impressions(s) / ED Diagnoses   Final diagnoses:  Urinary tract infection without hematuria, site unspecified  Confusion    Plan admission  Rolland Porter, MD, [place], MD 05/08/17 [phone]

## 2017-05-08 NOTE — ED Notes (Signed)
Date and time results received: 05/08/17 0320   Test: Troponin Critical Value: 0.09  Name of Provider Notified: EDP, Dr. Tomi Bamberger  Orders Received? Or Actions Taken?: N/A

## 2017-05-08 NOTE — Consult Note (Signed)
83 year old male who was admitted with altered mental statusand complaining of left flank pain.  He has a history of transitional cell carcinoma and is status post right nephroureterectomy in March 2018.  He also has a history of left ureteral stricture and this is being managed by periodic stent exchange.  His last stent was exchanged in July 2018.  CT scan demonstrate  Left-sided hydroureteronephrosis.  Comparing the CT scan from the emergency department this morning to a CT scan obtained in September 2018 there is no significant change or progression.  His stent currently appears to be in appropriate position.  His creatinine is normal.  He is afebrile.  At this point, I don't think that he needs a stent exchange. I didn't think that he should be treated for the urinary tract infection.  He also should be treated for constipation.  We will see him at the end of the day.

## 2017-05-08 NOTE — Progress Notes (Signed)
Nutrition Brief Note  Patient identified on the Malnutrition Screening Tool (MST) Report  Wt Readings from Last 15 Encounters:  05/08/17 154 lb 3.2 oz (69.9 kg)  03/31/17 154 lb (69.9 kg)  01/24/17 158 lb (71.7 kg)  01/16/17 158 lb (71.7 kg)  10/12/16 151 lb 6 oz (68.7 kg)  09/15/16 145 lb (65.8 kg)  09/05/16 145 lb (65.8 kg)  07/19/16 173 lb 11.6 oz (78.8 kg)  07/18/16 155 lb (70.3 kg)  07/12/16 157 lb 8 oz (71.4 kg)   Body mass index is 22.13 kg/m. Patient meets criteria for healthy wt for ht based on current BMI.   It sounds that patients wife had answered MST. Patient says his wife feels that he does not eat enough. Pt's son notes the wife is "gloom and doom". Patient says he eats 4x a day. These are smaller items. He drinks coffee and whole milk in the morning. He eats boiled eggs frequently. He drinks 1 ensure/day. He does not take any vitamins.   He estimates his UBW is 153 lbs. He thinks he may have lost a little weight over the past year. Per chart, his weight appears largely stable. He may have lost 5 or so lbs.  He says his appetite is good. He is wondering when he is to go home. He ate 75% of his breakfast this morning.   Labs and medications reviewed. He noted he takes miralax daily at home. Would recommend ordering bowel regimen since he requires one at home  No nutrition interventions warranted at this time. If nutrition issues arise, please consult RD.   Burtis Junes RD, LDN, CNSC Clinical Nutrition Pager: [phone] 05/08/2017 11:57 AM

## 2017-05-08 NOTE — ED Notes (Signed)
Bladder scan: 272ml before urination                         187ml after urination

## 2017-05-09 DIAGNOSIS — N390 Urinary tract infection, site not specified: Secondary | ICD-10-CM | POA: Diagnosis not present

## 2017-05-09 DIAGNOSIS — N136 Pyonephrosis: Secondary | ICD-10-CM | POA: Diagnosis not present

## 2017-05-09 DIAGNOSIS — L03115 Cellulitis of right lower limb: Secondary | ICD-10-CM | POA: Diagnosis not present

## 2017-05-09 DIAGNOSIS — I10 Essential (primary) hypertension: Secondary | ICD-10-CM | POA: Diagnosis not present

## 2017-05-09 DIAGNOSIS — R4182 Altered mental status, unspecified: Secondary | ICD-10-CM | POA: Diagnosis not present

## 2017-05-09 DIAGNOSIS — I482 Chronic atrial fibrillation: Secondary | ICD-10-CM | POA: Diagnosis not present

## 2017-05-09 DIAGNOSIS — Z66 Do not resuscitate: Secondary | ICD-10-CM | POA: Diagnosis not present

## 2017-05-09 DIAGNOSIS — Z23 Encounter for immunization: Secondary | ICD-10-CM | POA: Diagnosis not present

## 2017-05-09 DIAGNOSIS — N183 Chronic kidney disease, stage 3 (moderate): Secondary | ICD-10-CM | POA: Diagnosis not present

## 2017-05-09 DIAGNOSIS — G9341 Metabolic encephalopathy: Secondary | ICD-10-CM | POA: Diagnosis not present

## 2017-05-09 DIAGNOSIS — C641 Malignant neoplasm of right kidney, except renal pelvis: Secondary | ICD-10-CM | POA: Diagnosis not present

## 2017-05-09 DIAGNOSIS — D649 Anemia, unspecified: Secondary | ICD-10-CM | POA: Diagnosis not present

## 2017-05-09 DIAGNOSIS — I248 Other forms of acute ischemic heart disease: Secondary | ICD-10-CM | POA: Diagnosis not present

## 2017-05-09 DIAGNOSIS — G92 Toxic encephalopathy: Secondary | ICD-10-CM | POA: Diagnosis not present

## 2017-05-09 DIAGNOSIS — X58XXXA Exposure to other specified factors, initial encounter: Secondary | ICD-10-CM | POA: Diagnosis present

## 2017-05-09 LAB — CBC
HCT: 36.8 % — ABNORMAL LOW (ref 39.0–52.0)
Hemoglobin: 11.9 g/dL — ABNORMAL LOW (ref 13.0–17.0)
MCH: 32.2 pg (ref 26.0–34.0)
MCHC: 32.3 g/dL (ref 30.0–36.0)
MCV: 99.5 fL (ref 78.0–100.0)
PLATELETS: 122 10*3/uL — AB (ref 150–400)
RBC: 3.7 MIL/uL — AB (ref 4.22–5.81)
RDW: 16.7 % — ABNORMAL HIGH (ref 11.5–15.5)
WBC: 3.7 10*3/uL — ABNORMAL LOW (ref 4.0–10.5)

## 2017-05-09 LAB — BASIC METABOLIC PANEL
Anion gap: 7 (ref 5–15)
BUN: 31 mg/dL — AB (ref 6–20)
CHLORIDE: 106 mmol/L (ref 101–111)
CO2: 23 mmol/L (ref 22–32)
CREATININE: 1.51 mg/dL — AB (ref 0.61–1.24)
Calcium: 8.3 mg/dL — ABNORMAL LOW (ref 8.9–10.3)
GFR calc Af Amer: 43 mL/min — ABNORMAL LOW (ref >60)
GFR, EST NON AFRICAN AMERICAN: 37 mL/min — AB (ref >60)
GLUCOSE: 93 mg/dL (ref 65–99)
POTASSIUM: 4.4 mmol/L (ref 3.5–5.1)
SODIUM: 136 mmol/L (ref 135–145)

## 2017-05-09 LAB — URINE CULTURE
Culture: NO GROWTH
Special Requests: NORMAL

## 2017-05-09 MED ORDER — CALCIUM CARBONATE ANTACID 750 MG PO CHEW: 1.0000 | CHEWABLE_TABLET | Freq: Two times a day (BID) | ORAL | Status: DC | PRN

## 2017-05-09 NOTE — Discharge Summary (Signed)
Physician Discharge Summary  Austin Jackson:[id number] DOB: 1920-04-30 DOA: 05/08/2017  PCP: Neale Burly, MD  Admit date: 05/08/2017 Discharge date: 05/09/2017  Admitted From: Home Disposition:  Home  Recommendations for Outpatient Follow-up:  1. Follow up with PCP in 1-2 weeks 2. Return to hospital with any repeated episodes of confusion 3. Please obtain BMP/CBC in one week   Home Health: Yes- PT/RN/Aide Equipment/Devices: None   Discharge Condition:Stable CODE STATUS: DNR Diet recommendation: Regular diet  Brief/Interim Summary: 83 year old male with a history of chronic atrial fibrillation, CKD stage III, essential hypertension, urothelial carcinoma, left ureteral stricture with stent, iron deficiency anemia, hypothyroidism presented with paranoia and left flank pain.  The patient is alert and oriented x4, but states that he has been hearing and seeing strangers outside his house they are trying to come inside his house.  The patient called the police, and the patient stated that it was recommended that he come to the hospital for further evaluation.  The patient complains of one-week history of left flank pain with associated nausea without any emesis, diarrhea, hematochezia, melena.  He has had some dysuria and difficulty starting his urinary stream.  However he denies any fevers, chills, chest pain, shortness breath, headache, neck pain.  He has not started any new medications.  Upon presentation, the patient was afebrile and hemodynamically stable with WBC 7.0.  UA showed TNTC WBC, TNTC RBC.  The patient was started on ceftriaxone pending culture data.  Urine culture did not show any growth and as such antibiotics were stopped.  His mentation cleared and it is unclear whether this was due to IVF, antibiotics or sleep (son states patient had not slept in days prior to admission).  Given that there was no growth on culture patient was not sent home with antibiotics.  Patient's  son states that he was given antibiotics a month ago at [facility] for a possible urinary tract infection and culture ended up showing no growth.  Possible cellulitis was suspected during hospitalization but per son who arrived on day of discharge patient's legs do no appear any different.  Urology was consulted and suggest treatment of urinary tract infection pending culture data.  Discharge Diagnoses:  Principal Problem:   Urinary tract infection in male Active Problems:   Normocytic normochromic anemia   Hypothyroidism   Hypertension   Urothelial carcinoma of kidney, right (HCC)   Chronic renal disease, stage III (HCC)   Glaucoma, both eyes   Chronic atrial fibrillation (HCC)   Altered mental status   Acute metabolic encephalopathy   Acute pyelonephritis   CKD (chronic kidney disease) stage 3, GFR 30-59 ml/min (HCC)   Cellulitis of right leg    Discharge Instructions  Discharge Instructions    Call MD for:  difficulty breathing, headache or visual disturbances    Complete by:  As directed    Call MD for:  extreme fatigue    Complete by:  As directed    Call MD for:  hives    Complete by:  As directed    Call MD for:  persistant dizziness or light-headedness    Complete by:  As directed    Call MD for:  persistant nausea and vomiting    Complete by:  As directed    Call MD for:  redness, tenderness, or signs of infection (pain, swelling, redness, odor or green/yellow discharge around incision site)    Complete by:  As directed    Call MD for:  severe uncontrolled pain    Complete by:  As directed    Call MD for:  temperature >100.4    Complete by:  As directed    Diet - low sodium heart healthy    Complete by:  As directed    Increase activity slowly    Complete by:  As directed      Allergies as of 05/09/2017      Reactions   Codeine Nausea And Vomiting   Ultram [tramadol Hcl] Other (See Comments)   Confusion       Medication List    TAKE these  medications   acetaminophen 500 MG tablet Commonly known as:  TYLENOL Take 1,000 mg by mouth every 12 (twelve) hours. 6 am and 6 pm   ALPHAGAN P 0.1 % Soln Generic drug:  brimonidine Place 1 drop into both eyes 3 (three) times daily.   calcium carbonate 750 MG chewable tablet Commonly known as:  TUMS EX Chew 1 tablet (750 mg total) by mouth 2 (two) times daily as needed for heartburn. What changed:  how much to take   furosemide 20 MG tablet Commonly known as:  LASIX Take 20 mg by mouth 2 (two) times daily.   Iron 325 (65 Fe) MG Tabs Take 325 mg by mouth 2 (two) times daily.   latanoprost 0.005 % ophthalmic solution Commonly known as:  XALATAN Place 1 drop into both eyes at bedtime.   levothyroxine 50 MCG tablet Commonly known as:  SYNTHROID, LEVOTHROID Take 50 mcg by mouth at bedtime.   losartan 25 MG tablet Commonly known as:  COZAAR Take 25 mg by mouth daily.   metoprolol tartrate 25 MG tablet Commonly known as:  LOPRESSOR Take 12.5 mg by mouth 3 (three) times daily.   tamsulosin 0.4 MG Caps capsule Commonly known as:  FLOMAX Take 0.4 mg by mouth daily after supper.      Follow-up Information    Neale Burly, MD. Schedule an appointment as soon as possible for a visit in 1 week(s).   Specialty:  Internal Medicine Contact information: [place] [id number] [phone]          Allergies  Allergen Reactions  . Codeine Nausea And Vomiting  . Ultram [Tramadol Hcl] Other (See Comments)    Confusion     Consultations:  Urology   Procedures/Studies: US Venous Img Lower Bilateral  Result Date: 05/08/2017 CLINICAL DATA:  Neck pain. Cellulitis. Prior harvesting left greater saphenous vein. EXAM: BILATERAL LOWER EXTREMITY VENOUS DOPPLER ULTRASOUND TECHNIQUE: Gray-scale sonography with graded compression, as well as color Doppler and duplex ultrasound were performed to evaluate the lower extremity deep venous systems from the level of  the common femoral vein and including the common femoral, femoral, profunda femoral, popliteal and calf veins including the posterior tibial, peroneal and gastrocnemius veins when visible. The superficial great saphenous vein was also interrogated. Spectral Doppler was utilized to evaluate flow at rest and with distal augmentation maneuvers in the common femoral, femoral and popliteal veins. COMPARISON:  No prior . FINDINGS: RIGHT LOWER EXTREMITY Common Femoral Vein: No evidence of thrombus. Normal compressibility, respiratory phasicity and response to augmentation. Saphenofemoral Junction: No evidence of thrombus. Normal compressibility and flow on color Doppler imaging. Profunda Femoral Vein: No evidence of thrombus. Normal compressibility and flow on color Doppler imaging. Femoral Vein: No evidence of thrombus. Normal compressibility, respiratory phasicity and response to augmentation. Popliteal Vein: No evidence of thrombus. Normal compressibility, respiratory phasicity and response  to augmentation. Calf Veins: No evidence of thrombus. Normal compressibility and flow on color Doppler imaging. Superficial Great Saphenous Vein: No evidence of thrombus. Normal compressibility. Other Findings:  None. LEFT LOWER EXTREMITY Common Femoral Vein: No evidence of thrombus. Normal compressibility, respiratory phasicity and response to augmentation. Saphenofemoral Junction: No evidence of thrombus. Normal compressibility and flow on color Doppler imaging. Profunda Femoral Vein: No evidence of thrombus. Normal compressibility and flow on color Doppler imaging. Femoral Vein: No evidence of thrombus. Normal compressibility, respiratory phasicity and response to augmentation. Popliteal Vein: No evidence of thrombus. Normal compressibility, respiratory phasicity and response to augmentation. Calf Veins: No evidence of thrombus. Normal compressibility and flow on color Doppler imaging. Superficial Great Saphenous Vein: Previously  harvested. Other Findings:  None. IMPRESSION: No evidence of deep venous thrombosis. Electronically Signed   By: Marcello Moores  Register   On: 05/08/2017 10:32   Ct Renal Stone Study  Result Date: 05/08/2017 CLINICAL DATA:  83 year old with left flank pain status post stent placement. EXAM: CT ABDOMEN AND PELVIS WITHOUT CONTRAST TECHNIQUE: Multidetector CT imaging of the abdomen and pelvis was performed following the standard protocol without IV contrast. COMPARISON:  None. FINDINGS: Lower chest: Multi chamber cardiomegaly. Coronary artery and mitral annulus calcifications. Incidental bilateral gynecomastia. Streaky opacities at the lung bases, partially obscured by motion artifact. No pleural fluid. Hepatobiliary: No focal hepatic lesion allowing for lack contrast. Gallbladder physiologically distended, no calcified stone. No biliary dilatation. Pancreas: Age related parenchymal atrophy. No ductal dilatation or inflammation. Spleen: Slightly small in size. Adrenals/Urinary Tract: Low-density thickening of both adrenal glands consistent with adenomas. Post right nephrectomy. Left ureteral stent in place. Distal pigtail in the urinary bladder. Proximal pigtail within dilated proximal ureter/extrarenal pelvis. Left ureter is dilated throughout its course, despite stent placement. No evidence pararenal hematoma. No urolithiasis. Lobular left renal contours. 8 mm partially exophytic hyperdense lesion from the upper kidney is incompletely characterized without IV contrast. Urinary bladder is minimally distended with mild wall thickening. Mild perivesicular soft tissue stranding. Stomach/Bowel: Suboptimal bowel assessment without enteric contrast. Stomach is decompressed. Small bowel is nondistended. No bowel obstruction. Diverticulosis of the descending and sigmoid colon without diverticulitis. Moderate colonic stool burden. Appendix tentatively but not confidently visualized. Vascular/Lymphatic: Dense aortic and branch  atherosclerosis. No aneurysm. No bulky adenopathy. Reproductive: Prominent prostate gland spans 5 cm transverse. There is soft tissue density with both inguinal canals. Other: Small amount of simple free fluid in the pelvis. No free air. No evidence loculated abscess. Small fat containing supraumbilical ventral abdominal wall hernia without bowel involvement. Musculoskeletal: Advanced multilevel degenerative change throughout spine. Remote appearing compression fracture of T11. Remote right posterior rib fracture. No blastic or destructive osseous lesion. IMPRESSION: 1. Left ureteral stent in place. There is hydroureter despite stent placement. Dilatation of the left renal pelvis with question of extrarenal pelvis configuration. No large pararenal hematoma. There are no prior exams available for comparison. 2. Subcentimeter hyperdense lesion from the upper left kidney. Recommend correlation with prior outside imaging to evaluate for stability. 3. Minimally distended bladder with mild bladder wall thickening and perivesicular soft tissue edema, can be seen with urinary tract infection. 4. Advanced aortic atherosclerosis. Aortic Atherosclerosis (ICD10-I70.0). Coronary artery calcifications. 5. Additional incidental findings include cardiomegaly and coronary artery calcifications. Distal colonic diverticulosis. Fat containing ventral abdominal wall hernia. 6. Additional findings as described. Electronically Signed   By: Jeb Levering M.D.   On: 05/08/2017 03:21      Subjective: Patient awake and alert.  Conversing with MD  and son.  Son states patient has no confusion this morning.  Discharge Exam: Vitals:   05/08/17 2310 05/09/17 0348  BP: (!) 95/40 139/89  Pulse: 75 (!) 104  Resp: 20 18  Temp: 98.5 F (36.9 C) 98 F (36.7 C)  SpO2: 98% 95%   Vitals:   05/08/17 0616 05/08/17 1310 05/08/17 2310 05/09/17 0348  BP: 139/78 115/62 (!) 95/40 139/89  Pulse: 83 85 75 (!) 104  Resp: 16 18 20 18   Temp:  98.9 F (37.2 C) 98.1 F (36.7 C) 98.5 F (36.9 C) 98 F (36.7 C)  TempSrc: Oral Axillary Axillary Oral  SpO2: 100% 97% 98% 95%  Weight: 69.9 kg (154 lb 3.2 oz)     Height: 5\' 10"  (1.778 m)       General: Pt is alert, awake, not in acute distress Cardiovascular: RRR, S1/S2 +, no rubs, no gallops Respiratory: CTA bilaterally, no wheezing, no rhonchi Abdominal: Soft, NT, ND, bowel sounds + Extremities: no edema, no cyanosis    The results of significant diagnostics from this hospitalization (including imaging, microbiology, ancillary and laboratory) are listed below for reference.     Microbiology: Recent Results (from the past 240 hour(s))  Urine culture     Status: None   Collection Time: 05/08/17  2:20 AM  Result Value Ref Range Status   Specimen Description URINE, CLEAN CATCH  Final   Special Requests Normal  Final   Culture   Final    NO GROWTH Performed at [facility], [address], [place], [place] [address]    Report Status 05/09/2017 FINAL  Final     Labs: BNP (last 3 results) No results for input(s): BNP in the last 8760 hours. Basic Metabolic Panel:  Recent Labs Lab 05/08/17 0220 05/09/17 0505  NA 137 136  K 4.0 4.4  CL 103 106  CO2 24 23  GLUCOSE 108* 93  BUN 28* 31*  CREATININE 1.63* 1.51*  CALCIUM 8.5* 8.3*   Liver Function Tests:  Recent Labs Lab 05/08/17 0220  AST 22  ALT 10*  ALKPHOS 70  BILITOT 1.4*  PROT 8.2*  ALBUMIN 3.1*   No results for input(s): LIPASE, AMYLASE in the last 168 hours.  Recent Labs Lab 05/08/17 0220  AMMONIA <9*   CBC:  Recent Labs Lab 05/08/17 0220 05/09/17 0505  WBC 7.0 3.7*  NEUTROABS 5.2  --   HGB 12.2* 11.9*  HCT 38.1* 36.8*  MCV 100.5* 99.5  PLT 130* 122*   Cardiac Enzymes:  Recent Labs Lab 05/08/17 0220 05/08/17 0501 05/08/17 1247  TROPONINI 0.09* 0.09* 0.08*   BNP: Invalid input(s): POCBNP CBG: No results for input(s): GLUCAP in the last 168 hours. D-Dimer No  results for input(s): DDIMER in the last 72 hours. Hgb A1c No results for input(s): HGBA1C in the last 72 hours. Lipid Profile No results for input(s): CHOL, HDL, LDLCALC, TRIG, CHOLHDL, LDLDIRECT in the last 72 hours. Thyroid function studies No results for input(s): TSH, T4TOTAL, T3FREE, THYROIDAB in the last 72 hours.  Invalid input(s): FREET3 Anemia work up No results for input(s): VITAMINB12, FOLATE, FERRITIN, TIBC, IRON, RETICCTPCT in the last 72 hours. Urinalysis    Component Value Date/Time   COLORURINE YELLOW 05/08/2017 0220   APPEARANCEUR CLOUDY (A) 05/08/2017 0220   LABSPEC 1.013 05/08/2017 0220   PHURINE 6.0 05/08/2017 0220   GLUCOSEU NEGATIVE 05/08/2017 0220   HGBUR LARGE (A) 05/08/2017 0220   BILIRUBINUR NEGATIVE 05/08/2017 0220   KETONESUR NEGATIVE 05/08/2017 0220  PROTEINUR 100 (A) 05/08/2017 0220   NITRITE NEGATIVE 05/08/2017 0220   LEUKOCYTESUR LARGE (A) 05/08/2017 0220   Sepsis Labs Invalid input(s): PROCALCITONIN,  WBC,  LACTICIDVEN Microbiology Recent Results (from the past 240 hour(s))  Urine culture     Status: None   Collection Time: 05/08/17  2:20 AM  Result Value Ref Range Status   Specimen Description URINE, CLEAN CATCH  Final   Special Requests Normal  Final   Culture   Final    NO GROWTH Performed at [facility], [place] [address] [place], [place] [address]    Report Status 05/09/2017 FINAL  Final     Time coordinating discharge: 35 minutes  SIGNED:   Loretha Stapler, MD  [facility] 05/09/2017, 1:30 PM Pager [phone] If 7PM-7AM, please contact night-coverage [email or web address] Password [email or web address]

## 2017-05-09 NOTE — Care Management Important Message (Signed)
Important Message  Patient Details  Name: FAIZ WEBER MRN: [id number] Date of Birth: [birth year]   Medicare Important Message Given:  Yes    Sherald Barge, RN 05/09/2017, 1:46 PM

## 2017-05-09 NOTE — Care Management Note (Signed)
Case Management Note  Patient Details  Name: Austin Jackson MRN: [id number] Date of Birth: Jan 19, 1920  Subjective/Objective:    Admitted with AMS secondary to UTI. Pt's son at bedside. Pt is from home, lives with his wife who is also not in the best health. Pt has difficulty seeing, wife has difficulty with mobility. They are ind at home at this time. Pt has PCP, his son or transportation service takes him to appointments. His only son lives in [place] and make multiple trips per month to do the shopping. Pt reports he does his own bathing and dressing. He manages his own medications but has his wife check behind him. He has a cane, walker, WC and BSC to use as needed. He does not have life alert, he has paid for aid in the past through [employer] but they never showed up and he made [employer] refund him, "they have burnt their bridge". He does not want to hire another aid. He says they struggle with meal prep but make do with community support and son comes every couple weeks to bring meals. They have used meal on wheels in the past but his wife did not like their food and they chose to cancel the services. Pt has used [facility] in the past, he would like to use them again. He is aware they have 48 hrs to make first visit.    Action/Plan: Discharging home today with HH. Juliann Pulse, [facility] rep, aware of referral and will pull pt info from chart. Son at bedside for transport home.  Expected Discharge Date:  05/09/17               Expected Discharge Plan:  [place]  In-House Referral:  NA  Discharge planning Services  CM Consult  Post Acute Care Choice:  Home Health Choice offered to:  Patient  HH Arranged:  RN, PT [facility] Agency:  [place]  Status of Service:  Completed, signed off  Sherald Barge, RN 05/09/2017, 1:48 PM

## 2017-05-10 NOTE — Progress Notes (Signed)
Patient is to be discharged home and in stable condition. Patient's IV removed, WNL. Patient and family given discharge instructions and verbalized understanding. Patient will be escorted out by staff via wheelchair.  Gittel Mccamish P Dishmon, RN  

## 2017-05-11 DIAGNOSIS — H3530 Unspecified macular degeneration: Secondary | ICD-10-CM | POA: Diagnosis not present

## 2017-05-11 DIAGNOSIS — I482 Chronic atrial fibrillation: Secondary | ICD-10-CM | POA: Diagnosis not present

## 2017-05-11 DIAGNOSIS — N10 Acute tubulo-interstitial nephritis: Secondary | ICD-10-CM | POA: Diagnosis not present

## 2017-05-11 DIAGNOSIS — Z85528 Personal history of other malignant neoplasm of kidney: Secondary | ICD-10-CM | POA: Diagnosis not present

## 2017-05-11 DIAGNOSIS — Z8551 Personal history of malignant neoplasm of bladder: Secondary | ICD-10-CM | POA: Diagnosis not present

## 2017-05-11 DIAGNOSIS — D509 Iron deficiency anemia, unspecified: Secondary | ICD-10-CM | POA: Diagnosis not present

## 2017-05-11 DIAGNOSIS — K219 Gastro-esophageal reflux disease without esophagitis: Secondary | ICD-10-CM | POA: Diagnosis not present

## 2017-05-11 DIAGNOSIS — I509 Heart failure, unspecified: Secondary | ICD-10-CM | POA: Diagnosis not present

## 2017-05-11 DIAGNOSIS — N183 Chronic kidney disease, stage 3 (moderate): Secondary | ICD-10-CM | POA: Diagnosis not present

## 2017-05-11 DIAGNOSIS — N400 Enlarged prostate without lower urinary tract symptoms: Secondary | ICD-10-CM | POA: Diagnosis not present

## 2017-05-11 DIAGNOSIS — Z960 Presence of urogenital implants: Secondary | ICD-10-CM | POA: Diagnosis not present

## 2017-05-11 DIAGNOSIS — Z905 Acquired absence of kidney: Secondary | ICD-10-CM | POA: Diagnosis not present

## 2017-05-11 DIAGNOSIS — E039 Hypothyroidism, unspecified: Secondary | ICD-10-CM | POA: Diagnosis not present

## 2017-05-11 DIAGNOSIS — I130 Hypertensive heart and chronic kidney disease with heart failure and stage 1 through stage 4 chronic kidney disease, or unspecified chronic kidney disease: Secondary | ICD-10-CM | POA: Diagnosis not present

## 2017-05-11 DIAGNOSIS — H409 Unspecified glaucoma: Secondary | ICD-10-CM | POA: Diagnosis not present

## 2017-05-15 DIAGNOSIS — I130 Hypertensive heart and chronic kidney disease with heart failure and stage 1 through stage 4 chronic kidney disease, or unspecified chronic kidney disease: Secondary | ICD-10-CM | POA: Diagnosis not present

## 2017-05-15 DIAGNOSIS — I509 Heart failure, unspecified: Secondary | ICD-10-CM | POA: Diagnosis not present

## 2017-05-15 DIAGNOSIS — I482 Chronic atrial fibrillation: Secondary | ICD-10-CM | POA: Diagnosis not present

## 2017-05-15 DIAGNOSIS — N183 Chronic kidney disease, stage 3 (moderate): Secondary | ICD-10-CM | POA: Diagnosis not present

## 2017-05-15 DIAGNOSIS — N10 Acute tubulo-interstitial nephritis: Secondary | ICD-10-CM | POA: Diagnosis not present

## 2017-05-15 DIAGNOSIS — I5032 Chronic diastolic (congestive) heart failure: Secondary | ICD-10-CM | POA: Diagnosis not present

## 2017-05-15 DIAGNOSIS — H3530 Unspecified macular degeneration: Secondary | ICD-10-CM | POA: Diagnosis not present

## 2017-05-16 DIAGNOSIS — I130 Hypertensive heart and chronic kidney disease with heart failure and stage 1 through stage 4 chronic kidney disease, or unspecified chronic kidney disease: Secondary | ICD-10-CM | POA: Diagnosis not present

## 2017-05-16 DIAGNOSIS — H3530 Unspecified macular degeneration: Secondary | ICD-10-CM | POA: Diagnosis not present

## 2017-05-16 DIAGNOSIS — I482 Chronic atrial fibrillation: Secondary | ICD-10-CM | POA: Diagnosis not present

## 2017-05-16 DIAGNOSIS — N10 Acute tubulo-interstitial nephritis: Secondary | ICD-10-CM | POA: Diagnosis not present

## 2017-05-16 DIAGNOSIS — N183 Chronic kidney disease, stage 3 (moderate): Secondary | ICD-10-CM | POA: Diagnosis not present

## 2017-05-16 DIAGNOSIS — I509 Heart failure, unspecified: Secondary | ICD-10-CM | POA: Diagnosis not present

## 2017-05-17 DIAGNOSIS — N183 Chronic kidney disease, stage 3 (moderate): Secondary | ICD-10-CM | POA: Diagnosis not present

## 2017-05-17 DIAGNOSIS — N10 Acute tubulo-interstitial nephritis: Secondary | ICD-10-CM | POA: Diagnosis not present

## 2017-05-17 DIAGNOSIS — I509 Heart failure, unspecified: Secondary | ICD-10-CM | POA: Diagnosis not present

## 2017-05-17 DIAGNOSIS — I130 Hypertensive heart and chronic kidney disease with heart failure and stage 1 through stage 4 chronic kidney disease, or unspecified chronic kidney disease: Secondary | ICD-10-CM | POA: Diagnosis not present

## 2017-05-17 DIAGNOSIS — I482 Chronic atrial fibrillation: Secondary | ICD-10-CM | POA: Diagnosis not present

## 2017-05-17 DIAGNOSIS — H3530 Unspecified macular degeneration: Secondary | ICD-10-CM | POA: Diagnosis not present

## 2017-05-18 DIAGNOSIS — H3530 Unspecified macular degeneration: Secondary | ICD-10-CM | POA: Diagnosis not present

## 2017-05-18 DIAGNOSIS — N183 Chronic kidney disease, stage 3 (moderate): Secondary | ICD-10-CM | POA: Diagnosis not present

## 2017-05-18 DIAGNOSIS — I509 Heart failure, unspecified: Secondary | ICD-10-CM | POA: Diagnosis not present

## 2017-05-18 DIAGNOSIS — N10 Acute tubulo-interstitial nephritis: Secondary | ICD-10-CM | POA: Diagnosis not present

## 2017-05-18 DIAGNOSIS — I482 Chronic atrial fibrillation: Secondary | ICD-10-CM | POA: Diagnosis not present

## 2017-05-18 DIAGNOSIS — I130 Hypertensive heart and chronic kidney disease with heart failure and stage 1 through stage 4 chronic kidney disease, or unspecified chronic kidney disease: Secondary | ICD-10-CM | POA: Diagnosis not present

## 2017-05-21 DIAGNOSIS — I509 Heart failure, unspecified: Secondary | ICD-10-CM | POA: Diagnosis not present

## 2017-05-21 DIAGNOSIS — N10 Acute tubulo-interstitial nephritis: Secondary | ICD-10-CM | POA: Diagnosis not present

## 2017-05-21 DIAGNOSIS — N183 Chronic kidney disease, stage 3 (moderate): Secondary | ICD-10-CM | POA: Diagnosis not present

## 2017-05-21 DIAGNOSIS — H3530 Unspecified macular degeneration: Secondary | ICD-10-CM | POA: Diagnosis not present

## 2017-05-21 DIAGNOSIS — I130 Hypertensive heart and chronic kidney disease with heart failure and stage 1 through stage 4 chronic kidney disease, or unspecified chronic kidney disease: Secondary | ICD-10-CM | POA: Diagnosis not present

## 2017-05-21 DIAGNOSIS — I482 Chronic atrial fibrillation: Secondary | ICD-10-CM | POA: Diagnosis not present

## 2017-05-22 DIAGNOSIS — I482 Chronic atrial fibrillation: Secondary | ICD-10-CM | POA: Diagnosis not present

## 2017-05-22 DIAGNOSIS — I509 Heart failure, unspecified: Secondary | ICD-10-CM | POA: Diagnosis not present

## 2017-05-22 DIAGNOSIS — N183 Chronic kidney disease, stage 3 (moderate): Secondary | ICD-10-CM | POA: Diagnosis not present

## 2017-05-22 DIAGNOSIS — H3530 Unspecified macular degeneration: Secondary | ICD-10-CM | POA: Diagnosis not present

## 2017-05-22 DIAGNOSIS — I130 Hypertensive heart and chronic kidney disease with heart failure and stage 1 through stage 4 chronic kidney disease, or unspecified chronic kidney disease: Secondary | ICD-10-CM | POA: Diagnosis not present

## 2017-05-22 DIAGNOSIS — N10 Acute tubulo-interstitial nephritis: Secondary | ICD-10-CM | POA: Diagnosis not present

## 2017-05-23 DIAGNOSIS — I509 Heart failure, unspecified: Secondary | ICD-10-CM | POA: Diagnosis not present

## 2017-05-23 DIAGNOSIS — I482 Chronic atrial fibrillation: Secondary | ICD-10-CM | POA: Diagnosis not present

## 2017-05-23 DIAGNOSIS — I130 Hypertensive heart and chronic kidney disease with heart failure and stage 1 through stage 4 chronic kidney disease, or unspecified chronic kidney disease: Secondary | ICD-10-CM | POA: Diagnosis not present

## 2017-05-23 DIAGNOSIS — H3530 Unspecified macular degeneration: Secondary | ICD-10-CM | POA: Diagnosis not present

## 2017-05-23 DIAGNOSIS — N10 Acute tubulo-interstitial nephritis: Secondary | ICD-10-CM | POA: Diagnosis not present

## 2017-05-23 DIAGNOSIS — N183 Chronic kidney disease, stage 3 (moderate): Secondary | ICD-10-CM | POA: Diagnosis not present

## 2017-05-24 DIAGNOSIS — I130 Hypertensive heart and chronic kidney disease with heart failure and stage 1 through stage 4 chronic kidney disease, or unspecified chronic kidney disease: Secondary | ICD-10-CM | POA: Diagnosis not present

## 2017-05-24 DIAGNOSIS — I509 Heart failure, unspecified: Secondary | ICD-10-CM | POA: Diagnosis not present

## 2017-05-24 DIAGNOSIS — H3530 Unspecified macular degeneration: Secondary | ICD-10-CM | POA: Diagnosis not present

## 2017-05-24 DIAGNOSIS — I482 Chronic atrial fibrillation: Secondary | ICD-10-CM | POA: Diagnosis not present

## 2017-05-24 DIAGNOSIS — N10 Acute tubulo-interstitial nephritis: Secondary | ICD-10-CM | POA: Diagnosis not present

## 2017-05-24 DIAGNOSIS — N183 Chronic kidney disease, stage 3 (moderate): Secondary | ICD-10-CM | POA: Diagnosis not present

## 2017-05-25 DIAGNOSIS — E038 Other specified hypothyroidism: Secondary | ICD-10-CM | POA: Diagnosis not present

## 2017-05-25 DIAGNOSIS — I10 Essential (primary) hypertension: Secondary | ICD-10-CM | POA: Diagnosis not present

## 2017-05-25 DIAGNOSIS — N400 Enlarged prostate without lower urinary tract symptoms: Secondary | ICD-10-CM | POA: Diagnosis not present

## 2017-05-25 DIAGNOSIS — I5032 Chronic diastolic (congestive) heart failure: Secondary | ICD-10-CM | POA: Diagnosis not present

## 2017-05-25 DIAGNOSIS — N182 Chronic kidney disease, stage 2 (mild): Secondary | ICD-10-CM | POA: Diagnosis not present

## 2017-05-28 DIAGNOSIS — I482 Chronic atrial fibrillation: Secondary | ICD-10-CM | POA: Diagnosis not present

## 2017-05-28 DIAGNOSIS — I509 Heart failure, unspecified: Secondary | ICD-10-CM | POA: Diagnosis not present

## 2017-05-28 DIAGNOSIS — I130 Hypertensive heart and chronic kidney disease with heart failure and stage 1 through stage 4 chronic kidney disease, or unspecified chronic kidney disease: Secondary | ICD-10-CM | POA: Diagnosis not present

## 2017-05-28 DIAGNOSIS — H3530 Unspecified macular degeneration: Secondary | ICD-10-CM | POA: Diagnosis not present

## 2017-05-28 DIAGNOSIS — N183 Chronic kidney disease, stage 3 (moderate): Secondary | ICD-10-CM | POA: Diagnosis not present

## 2017-05-28 DIAGNOSIS — N10 Acute tubulo-interstitial nephritis: Secondary | ICD-10-CM | POA: Diagnosis not present

## 2017-05-29 DIAGNOSIS — H3530 Unspecified macular degeneration: Secondary | ICD-10-CM | POA: Diagnosis not present

## 2017-05-29 DIAGNOSIS — I509 Heart failure, unspecified: Secondary | ICD-10-CM | POA: Diagnosis not present

## 2017-05-29 DIAGNOSIS — N183 Chronic kidney disease, stage 3 (moderate): Secondary | ICD-10-CM | POA: Diagnosis not present

## 2017-05-29 DIAGNOSIS — I482 Chronic atrial fibrillation: Secondary | ICD-10-CM | POA: Diagnosis not present

## 2017-05-29 DIAGNOSIS — N10 Acute tubulo-interstitial nephritis: Secondary | ICD-10-CM | POA: Diagnosis not present

## 2017-05-29 DIAGNOSIS — I130 Hypertensive heart and chronic kidney disease with heart failure and stage 1 through stage 4 chronic kidney disease, or unspecified chronic kidney disease: Secondary | ICD-10-CM | POA: Diagnosis not present

## 2017-05-30 DIAGNOSIS — I482 Chronic atrial fibrillation: Secondary | ICD-10-CM | POA: Diagnosis not present

## 2017-05-30 DIAGNOSIS — N183 Chronic kidney disease, stage 3 (moderate): Secondary | ICD-10-CM | POA: Diagnosis not present

## 2017-05-30 DIAGNOSIS — I130 Hypertensive heart and chronic kidney disease with heart failure and stage 1 through stage 4 chronic kidney disease, or unspecified chronic kidney disease: Secondary | ICD-10-CM | POA: Diagnosis not present

## 2017-05-30 DIAGNOSIS — H3530 Unspecified macular degeneration: Secondary | ICD-10-CM | POA: Diagnosis not present

## 2017-05-30 DIAGNOSIS — N10 Acute tubulo-interstitial nephritis: Secondary | ICD-10-CM | POA: Diagnosis not present

## 2017-05-30 DIAGNOSIS — I509 Heart failure, unspecified: Secondary | ICD-10-CM | POA: Diagnosis not present

## 2017-06-05 DIAGNOSIS — N183 Chronic kidney disease, stage 3 (moderate): Secondary | ICD-10-CM | POA: Diagnosis not present

## 2017-06-05 DIAGNOSIS — I509 Heart failure, unspecified: Secondary | ICD-10-CM | POA: Diagnosis not present

## 2017-06-05 DIAGNOSIS — I482 Chronic atrial fibrillation: Secondary | ICD-10-CM | POA: Diagnosis not present

## 2017-06-05 DIAGNOSIS — H3530 Unspecified macular degeneration: Secondary | ICD-10-CM | POA: Diagnosis not present

## 2017-06-05 DIAGNOSIS — N10 Acute tubulo-interstitial nephritis: Secondary | ICD-10-CM | POA: Diagnosis not present

## 2017-06-05 DIAGNOSIS — I130 Hypertensive heart and chronic kidney disease with heart failure and stage 1 through stage 4 chronic kidney disease, or unspecified chronic kidney disease: Secondary | ICD-10-CM | POA: Diagnosis not present

## 2017-06-07 DIAGNOSIS — I130 Hypertensive heart and chronic kidney disease with heart failure and stage 1 through stage 4 chronic kidney disease, or unspecified chronic kidney disease: Secondary | ICD-10-CM | POA: Diagnosis not present

## 2017-06-07 DIAGNOSIS — H3530 Unspecified macular degeneration: Secondary | ICD-10-CM | POA: Diagnosis not present

## 2017-06-07 DIAGNOSIS — N10 Acute tubulo-interstitial nephritis: Secondary | ICD-10-CM | POA: Diagnosis not present

## 2017-06-07 DIAGNOSIS — I482 Chronic atrial fibrillation: Secondary | ICD-10-CM | POA: Diagnosis not present

## 2017-06-07 DIAGNOSIS — N183 Chronic kidney disease, stage 3 (moderate): Secondary | ICD-10-CM | POA: Diagnosis not present

## 2017-06-07 DIAGNOSIS — I509 Heart failure, unspecified: Secondary | ICD-10-CM | POA: Diagnosis not present

## 2017-06-12 DIAGNOSIS — N10 Acute tubulo-interstitial nephritis: Secondary | ICD-10-CM | POA: Diagnosis not present

## 2017-06-12 DIAGNOSIS — I509 Heart failure, unspecified: Secondary | ICD-10-CM | POA: Diagnosis not present

## 2017-06-12 DIAGNOSIS — I130 Hypertensive heart and chronic kidney disease with heart failure and stage 1 through stage 4 chronic kidney disease, or unspecified chronic kidney disease: Secondary | ICD-10-CM | POA: Diagnosis not present

## 2017-06-12 DIAGNOSIS — I482 Chronic atrial fibrillation: Secondary | ICD-10-CM | POA: Diagnosis not present

## 2017-06-12 DIAGNOSIS — H3530 Unspecified macular degeneration: Secondary | ICD-10-CM | POA: Diagnosis not present

## 2017-06-12 DIAGNOSIS — N183 Chronic kidney disease, stage 3 (moderate): Secondary | ICD-10-CM | POA: Diagnosis not present

## 2017-06-14 DIAGNOSIS — N10 Acute tubulo-interstitial nephritis: Secondary | ICD-10-CM | POA: Diagnosis not present

## 2017-06-14 DIAGNOSIS — I509 Heart failure, unspecified: Secondary | ICD-10-CM | POA: Diagnosis not present

## 2017-06-14 DIAGNOSIS — N183 Chronic kidney disease, stage 3 (moderate): Secondary | ICD-10-CM | POA: Diagnosis not present

## 2017-06-14 DIAGNOSIS — I482 Chronic atrial fibrillation: Secondary | ICD-10-CM | POA: Diagnosis not present

## 2017-06-14 DIAGNOSIS — I130 Hypertensive heart and chronic kidney disease with heart failure and stage 1 through stage 4 chronic kidney disease, or unspecified chronic kidney disease: Secondary | ICD-10-CM | POA: Diagnosis not present

## 2017-06-14 DIAGNOSIS — H3530 Unspecified macular degeneration: Secondary | ICD-10-CM | POA: Diagnosis not present

## 2017-06-15 DIAGNOSIS — H3530 Unspecified macular degeneration: Secondary | ICD-10-CM | POA: Diagnosis not present

## 2017-06-15 DIAGNOSIS — I130 Hypertensive heart and chronic kidney disease with heart failure and stage 1 through stage 4 chronic kidney disease, or unspecified chronic kidney disease: Secondary | ICD-10-CM | POA: Diagnosis not present

## 2017-06-15 DIAGNOSIS — I509 Heart failure, unspecified: Secondary | ICD-10-CM | POA: Diagnosis not present

## 2017-06-15 DIAGNOSIS — I482 Chronic atrial fibrillation: Secondary | ICD-10-CM | POA: Diagnosis not present

## 2017-06-15 DIAGNOSIS — N183 Chronic kidney disease, stage 3 (moderate): Secondary | ICD-10-CM | POA: Diagnosis not present

## 2017-06-15 DIAGNOSIS — N10 Acute tubulo-interstitial nephritis: Secondary | ICD-10-CM | POA: Diagnosis not present

## 2017-06-27 DIAGNOSIS — H3530 Unspecified macular degeneration: Secondary | ICD-10-CM | POA: Diagnosis not present

## 2017-06-27 DIAGNOSIS — N10 Acute tubulo-interstitial nephritis: Secondary | ICD-10-CM | POA: Diagnosis not present

## 2017-06-27 DIAGNOSIS — I509 Heart failure, unspecified: Secondary | ICD-10-CM | POA: Diagnosis not present

## 2017-06-27 DIAGNOSIS — I130 Hypertensive heart and chronic kidney disease with heart failure and stage 1 through stage 4 chronic kidney disease, or unspecified chronic kidney disease: Secondary | ICD-10-CM | POA: Diagnosis not present

## 2017-06-27 DIAGNOSIS — I482 Chronic atrial fibrillation: Secondary | ICD-10-CM | POA: Diagnosis not present

## 2017-06-27 DIAGNOSIS — N183 Chronic kidney disease, stage 3 (moderate): Secondary | ICD-10-CM | POA: Diagnosis not present

## 2017-07-05 DIAGNOSIS — C679 Malignant neoplasm of bladder, unspecified: Secondary | ICD-10-CM | POA: Diagnosis not present

## 2017-07-05 DIAGNOSIS — C672 Malignant neoplasm of lateral wall of bladder: Secondary | ICD-10-CM | POA: Diagnosis not present

## 2017-07-16 DIAGNOSIS — N183 Chronic kidney disease, stage 3 (moderate): Secondary | ICD-10-CM | POA: Diagnosis not present

## 2017-07-16 DIAGNOSIS — C672 Malignant neoplasm of lateral wall of bladder: Secondary | ICD-10-CM | POA: Diagnosis not present

## 2017-07-16 DIAGNOSIS — Z905 Acquired absence of kidney: Secondary | ICD-10-CM | POA: Diagnosis not present

## 2017-07-16 DIAGNOSIS — C651 Malignant neoplasm of right renal pelvis: Secondary | ICD-10-CM | POA: Diagnosis not present

## 2017-07-16 DIAGNOSIS — N134 Hydroureter: Secondary | ICD-10-CM | POA: Diagnosis not present

## 2017-07-16 DIAGNOSIS — R8279 Other abnormal findings on microbiological examination of urine: Secondary | ICD-10-CM | POA: Diagnosis not present

## 2017-07-19 ENCOUNTER — Other Ambulatory Visit: Payer: Self-pay | Admitting: Urology

## 2017-07-27 NOTE — Progress Notes (Signed)
EKG 05-08-17 epic

## 2017-07-27 NOTE — Patient Instructions (Addendum)
  CLEAR LIQUID DIET   Foods Allowed                                                                     Foods Excluded  Coffee and tea, regular and decaf                             liquids that you cannot  Plain Jell-O in any flavor                                             see through such as: Fruit ices (not with fruit pulp)                                     milk, soups, orange juice  Iced Popsicles                                    All solid food Carbonated beverages, regular and diet                                    Cranberry, grape and apple juices Sports drinks like Gatorade Lightly seasoned clear broth or consume(fat free) Sugar, honey syrup  Sample Menu Breakfast                                Lunch                                     Supper Cranberry juice                    Beef broth                            Chicken broth Jell-O                                     Grape juice                           Apple juice Coffee or tea                        Jell-O                                      Popsicle                                                  Coffee or tea                        Coffee or tea  _____________________________________________________________________   

## 2017-07-30 ENCOUNTER — Encounter (HOSPITAL_COMMUNITY)
Admission: RE | Admit: 2017-07-30 | Discharge: 2017-07-30 | Disposition: A | Discharge status: Home / Self Care | Payer: Medicare Other | Source: Ambulatory Visit | Attending: Urology | Admitting: Urology

## 2017-07-30 ENCOUNTER — Encounter (HOSPITAL_COMMUNITY): Payer: Self-pay

## 2017-07-30 ENCOUNTER — Other Ambulatory Visit: Payer: Self-pay

## 2017-07-30 DIAGNOSIS — D509 Iron deficiency anemia, unspecified: Secondary | ICD-10-CM | POA: Diagnosis not present

## 2017-07-30 DIAGNOSIS — X58XXXS Exposure to other specified factors, sequela: Secondary | ICD-10-CM | POA: Diagnosis not present

## 2017-07-30 DIAGNOSIS — I509 Heart failure, unspecified: Secondary | ICD-10-CM | POA: Diagnosis not present

## 2017-07-30 DIAGNOSIS — K219 Gastro-esophageal reflux disease without esophagitis: Secondary | ICD-10-CM | POA: Diagnosis not present

## 2017-07-30 DIAGNOSIS — N131 Hydronephrosis with ureteral stricture, not elsewhere classified: Secondary | ICD-10-CM | POA: Diagnosis not present

## 2017-07-30 DIAGNOSIS — Z79899 Other long term (current) drug therapy: Secondary | ICD-10-CM | POA: Diagnosis not present

## 2017-07-30 DIAGNOSIS — Z905 Acquired absence of kidney: Secondary | ICD-10-CM | POA: Diagnosis not present

## 2017-07-30 DIAGNOSIS — Z8551 Personal history of malignant neoplasm of bladder: Secondary | ICD-10-CM | POA: Diagnosis not present

## 2017-07-30 DIAGNOSIS — Z85528 Personal history of other malignant neoplasm of kidney: Secondary | ICD-10-CM | POA: Diagnosis not present

## 2017-07-30 DIAGNOSIS — N135 Crossing vessel and stricture of ureter without hydronephrosis: Secondary | ICD-10-CM | POA: Diagnosis present

## 2017-07-30 DIAGNOSIS — Z8249 Family history of ischemic heart disease and other diseases of the circulatory system: Secondary | ICD-10-CM | POA: Diagnosis not present

## 2017-07-30 DIAGNOSIS — I130 Hypertensive heart and chronic kidney disease with heart failure and stage 1 through stage 4 chronic kidney disease, or unspecified chronic kidney disease: Secondary | ICD-10-CM | POA: Diagnosis not present

## 2017-07-30 DIAGNOSIS — Z888 Allergy status to other drugs, medicaments and biological substances status: Secondary | ICD-10-CM | POA: Diagnosis not present

## 2017-07-30 DIAGNOSIS — H409 Unspecified glaucoma: Secondary | ICD-10-CM | POA: Diagnosis not present

## 2017-07-30 DIAGNOSIS — N183 Chronic kidney disease, stage 3 (moderate): Secondary | ICD-10-CM | POA: Diagnosis not present

## 2017-07-30 DIAGNOSIS — Z885 Allergy status to narcotic agent status: Secondary | ICD-10-CM | POA: Diagnosis not present

## 2017-07-30 DIAGNOSIS — H3530 Unspecified macular degeneration: Secondary | ICD-10-CM | POA: Diagnosis not present

## 2017-07-30 DIAGNOSIS — I482 Chronic atrial fibrillation: Secondary | ICD-10-CM | POA: Diagnosis not present

## 2017-07-30 DIAGNOSIS — T462X5S Adverse effect of other antidysrhythmic drugs, sequela: Secondary | ICD-10-CM | POA: Diagnosis not present

## 2017-07-30 DIAGNOSIS — E032 Hypothyroidism due to medicaments and other exogenous substances: Secondary | ICD-10-CM | POA: Diagnosis not present

## 2017-07-30 DIAGNOSIS — Z87442 Personal history of urinary calculi: Secondary | ICD-10-CM | POA: Diagnosis not present

## 2017-07-30 DIAGNOSIS — M1990 Unspecified osteoarthritis, unspecified site: Secondary | ICD-10-CM | POA: Diagnosis not present

## 2017-07-30 DIAGNOSIS — Z87891 Personal history of nicotine dependence: Secondary | ICD-10-CM | POA: Diagnosis not present

## 2017-07-30 LAB — CBC
HEMATOCRIT: 28.2 % — AB (ref 39.0–52.0)
HEMOGLOBIN: 9.1 g/dL — AB (ref 13.0–17.0)
MCH: 32 pg (ref 26.0–34.0)
MCHC: 32.3 g/dL (ref 30.0–36.0)
MCV: 99.3 fL (ref 78.0–100.0)
Platelets: 227 10*3/uL (ref 150–400)
RBC: 2.84 MIL/uL — AB (ref 4.22–5.81)
RDW: 16.3 % — ABNORMAL HIGH (ref 11.5–15.5)
WBC: 4.1 10*3/uL (ref 4.0–10.5)

## 2017-07-30 LAB — BASIC METABOLIC PANEL
Anion gap: 4 — ABNORMAL LOW (ref 5–15)
BUN: 37 mg/dL — ABNORMAL HIGH (ref 6–20)
CHLORIDE: 107 mmol/L (ref 101–111)
CO2: 26 mmol/L (ref 22–32)
CREATININE: 1.64 mg/dL — AB (ref 0.61–1.24)
Calcium: 8.7 mg/dL — ABNORMAL LOW (ref 8.9–10.3)
GFR calc non Af Amer: 34 mL/min — ABNORMAL LOW (ref >60)
GFR, EST AFRICAN AMERICAN: 39 mL/min — AB (ref >60)
Glucose, Bld: 123 mg/dL — ABNORMAL HIGH (ref 65–99)
POTASSIUM: 4.1 mmol/L (ref 3.5–5.1)
SODIUM: 137 mmol/L (ref 135–145)

## 2017-07-30 NOTE — Progress Notes (Signed)
Cbcdiff, bmp on chart from [facility]

## 2017-07-31 NOTE — Progress Notes (Signed)
CBC and BMP routed via epic to Dr [name]

## 2017-08-01 ENCOUNTER — Encounter (HOSPITAL_COMMUNITY)
Admission: RE | Disposition: A | Discharge status: Home / Self Care | Payer: Self-pay | Source: Ambulatory Visit | Attending: Urology

## 2017-08-01 ENCOUNTER — Ambulatory Visit (HOSPITAL_COMMUNITY): Payer: Medicare Other | Admitting: Anesthesiology

## 2017-08-01 ENCOUNTER — Ambulatory Visit (HOSPITAL_COMMUNITY)
Admission: RE | Admit: 2017-08-01 | Discharge: 2017-08-01 | Disposition: A | Discharge status: Home / Self Care | Payer: Medicare Other | Source: Ambulatory Visit | Attending: Urology | Admitting: Urology

## 2017-08-01 ENCOUNTER — Encounter (HOSPITAL_COMMUNITY): Payer: Self-pay | Admitting: *Deleted

## 2017-08-01 ENCOUNTER — Ambulatory Visit (HOSPITAL_COMMUNITY): Payer: Medicare Other

## 2017-08-01 DIAGNOSIS — I482 Chronic atrial fibrillation: Secondary | ICD-10-CM | POA: Diagnosis not present

## 2017-08-01 DIAGNOSIS — Z8551 Personal history of malignant neoplasm of bladder: Secondary | ICD-10-CM | POA: Diagnosis not present

## 2017-08-01 DIAGNOSIS — Q600 Renal agenesis, unilateral: Secondary | ICD-10-CM | POA: Diagnosis not present

## 2017-08-01 DIAGNOSIS — I509 Heart failure, unspecified: Secondary | ICD-10-CM | POA: Diagnosis not present

## 2017-08-01 DIAGNOSIS — Z905 Acquired absence of kidney: Secondary | ICD-10-CM | POA: Diagnosis not present

## 2017-08-01 DIAGNOSIS — D509 Iron deficiency anemia, unspecified: Secondary | ICD-10-CM | POA: Insufficient documentation

## 2017-08-01 DIAGNOSIS — H3530 Unspecified macular degeneration: Secondary | ICD-10-CM | POA: Insufficient documentation

## 2017-08-01 DIAGNOSIS — Z888 Allergy status to other drugs, medicaments and biological substances status: Secondary | ICD-10-CM | POA: Insufficient documentation

## 2017-08-01 DIAGNOSIS — I130 Hypertensive heart and chronic kidney disease with heart failure and stage 1 through stage 4 chronic kidney disease, or unspecified chronic kidney disease: Secondary | ICD-10-CM | POA: Insufficient documentation

## 2017-08-01 DIAGNOSIS — Z885 Allergy status to narcotic agent status: Secondary | ICD-10-CM | POA: Insufficient documentation

## 2017-08-01 DIAGNOSIS — Z8554 Personal history of malignant neoplasm of ureter: Secondary | ICD-10-CM | POA: Diagnosis not present

## 2017-08-01 DIAGNOSIS — Z85528 Personal history of other malignant neoplasm of kidney: Secondary | ICD-10-CM | POA: Diagnosis not present

## 2017-08-01 DIAGNOSIS — Z8249 Family history of ischemic heart disease and other diseases of the circulatory system: Secondary | ICD-10-CM | POA: Insufficient documentation

## 2017-08-01 DIAGNOSIS — K219 Gastro-esophageal reflux disease without esophagitis: Secondary | ICD-10-CM | POA: Insufficient documentation

## 2017-08-01 DIAGNOSIS — N131 Hydronephrosis with ureteral stricture, not elsewhere classified: Secondary | ICD-10-CM | POA: Insufficient documentation

## 2017-08-01 DIAGNOSIS — Z87442 Personal history of urinary calculi: Secondary | ICD-10-CM | POA: Diagnosis not present

## 2017-08-01 DIAGNOSIS — T462X5S Adverse effect of other antidysrhythmic drugs, sequela: Secondary | ICD-10-CM | POA: Insufficient documentation

## 2017-08-01 DIAGNOSIS — H409 Unspecified glaucoma: Secondary | ICD-10-CM | POA: Diagnosis not present

## 2017-08-01 DIAGNOSIS — E032 Hypothyroidism due to medicaments and other exogenous substances: Secondary | ICD-10-CM | POA: Insufficient documentation

## 2017-08-01 DIAGNOSIS — Z87891 Personal history of nicotine dependence: Secondary | ICD-10-CM | POA: Insufficient documentation

## 2017-08-01 DIAGNOSIS — Z466 Encounter for fitting and adjustment of urinary device: Secondary | ICD-10-CM | POA: Diagnosis not present

## 2017-08-01 DIAGNOSIS — N135 Crossing vessel and stricture of ureter without hydronephrosis: Secondary | ICD-10-CM | POA: Diagnosis not present

## 2017-08-01 DIAGNOSIS — Z79899 Other long term (current) drug therapy: Secondary | ICD-10-CM | POA: Insufficient documentation

## 2017-08-01 DIAGNOSIS — M1990 Unspecified osteoarthritis, unspecified site: Secondary | ICD-10-CM | POA: Insufficient documentation

## 2017-08-01 DIAGNOSIS — N183 Chronic kidney disease, stage 3 (moderate): Secondary | ICD-10-CM | POA: Insufficient documentation

## 2017-08-01 DIAGNOSIS — X58XXXS Exposure to other specified factors, sequela: Secondary | ICD-10-CM | POA: Insufficient documentation

## 2017-08-01 HISTORY — PX: CYSTOSCOPY W/ URETERAL STENT PLACEMENT: SHX1429

## 2017-08-01 SURGERY — CYSTOSCOPY, FLEXIBLE, WITH STENT REPLACEMENT
Anesthesia: Monitor Anesthesia Care | Laterality: Left

## 2017-08-01 MED ORDER — PROMETHAZINE HCL 25 MG/ML IJ SOLN: 6.2500 mg | INTRAMUSCULAR | Status: DC | PRN

## 2017-08-01 MED ORDER — FENTANYL CITRATE (PF) 100 MCG/2ML IJ SOLN
INTRAMUSCULAR | Status: DC | PRN
  Administered 2017-08-01: 25 ug via INTRAVENOUS

## 2017-08-01 MED ORDER — ONDANSETRON HCL 4 MG/2ML IJ SOLN
INTRAMUSCULAR | Status: AC
  Filled 2017-08-01: qty 2

## 2017-08-01 MED ORDER — 0.9 % SODIUM CHLORIDE (POUR BTL) OPTIME
TOPICAL | Status: DC | PRN
  Administered 2017-08-01: 1000 mL

## 2017-08-01 MED ORDER — ONDANSETRON HCL 4 MG/2ML IJ SOLN
INTRAMUSCULAR | Status: DC | PRN
  Administered 2017-08-01: 4 mg via INTRAVENOUS

## 2017-08-01 MED ORDER — FENTANYL CITRATE (PF) 100 MCG/2ML IJ SOLN
INTRAMUSCULAR | Status: AC
  Filled 2017-08-01: qty 2

## 2017-08-01 MED ORDER — IOHEXOL 300 MG/ML  SOLN
INTRAMUSCULAR | Status: DC | PRN
  Administered 2017-08-01: 17 mL via INTRAVENOUS

## 2017-08-01 MED ORDER — PROPOFOL 500 MG/50ML IV EMUL
INTRAVENOUS | Status: DC | PRN
  Administered 2017-08-01: 75 ug/kg/min via INTRAVENOUS

## 2017-08-01 MED ORDER — FENTANYL CITRATE (PF) 100 MCG/2ML IJ SOLN: 25.0000 ug | INTRAMUSCULAR | Status: DC | PRN

## 2017-08-01 MED ORDER — PROPOFOL 10 MG/ML IV BOLUS
INTRAVENOUS | Status: DC | PRN
  Administered 2017-08-01: 40 mg via INTRAVENOUS
  Administered 2017-08-01: 50 mg via INTRAVENOUS

## 2017-08-01 MED ORDER — PROPOFOL 10 MG/ML IV BOLUS
INTRAVENOUS | Status: AC
  Filled 2017-08-01: qty 20

## 2017-08-01 MED ORDER — LIDOCAINE HCL 2 % EX GEL
CUTANEOUS | Status: AC
  Filled 2017-08-01: qty 5

## 2017-08-01 MED ORDER — LACTATED RINGERS IV SOLN
INTRAVENOUS | Status: DC
  Administered 2017-08-01 (×2): via INTRAVENOUS

## 2017-08-01 MED ORDER — SODIUM CHLORIDE 0.9 % IR SOLN
Status: DC | PRN
  Administered 2017-08-01: 3000 mL

## 2017-08-01 MED ORDER — LIDOCAINE HCL 2 % EX GEL
CUTANEOUS | Status: DC | PRN
  Administered 2017-08-01: 1

## 2017-08-01 MED ORDER — DEXTROSE 5 % IV SOLN
2.0000 g | INTRAVENOUS | Status: AC
  Administered 2017-08-01: 2 g via INTRAVENOUS
  Filled 2017-08-01: qty 2

## 2017-08-01 SURGICAL SUPPLY — 16 items
BAG URO CATCHER STRL LF (MISCELLANEOUS) ×3 IMPLANT
BASKET ZERO TIP NITINOL 2.4FR (BASKET) IMPLANT
CATH BEACON 5 .035 65 KMP TIP (CATHETERS) ×3 IMPLANT
CATH INTERMIT  6FR 70CM (CATHETERS) ×3 IMPLANT
CLOTH BEACON ORANGE TIMEOUT ST (SAFETY) ×3 IMPLANT
COVER FOOTSWITCH UNIV (MISCELLANEOUS) IMPLANT
COVER SURGICAL LIGHT HANDLE (MISCELLANEOUS) ×3 IMPLANT
GLOVE BIOGEL M STRL SZ7.5 (GLOVE) ×3 IMPLANT
GOWN STRL REUS W/TWL LRG LVL3 (GOWN DISPOSABLE) ×6 IMPLANT
GUIDEWIRE ANG ZIPWIRE 038X150 (WIRE) IMPLANT
GUIDEWIRE STR DUAL SENSOR (WIRE) ×3 IMPLANT
MANIFOLD NEPTUNE II (INSTRUMENTS) ×3 IMPLANT
PACK CYSTO (CUSTOM PROCEDURE TRAY) ×3 IMPLANT
STENT URET 6FRX26 CONTOUR (STENTS) ×3 IMPLANT
TUBING CONNECTING 10 (TUBING) ×2 IMPLANT
TUBING CONNECTING 10' (TUBING) ×1

## 2017-08-01 NOTE — Anesthesia Procedure Notes (Signed)
Procedure Name: LMA Insertion Date/Time: 08/01/2017 1:49 PM Performed by: [place] Dimonique Bourdeau C, CRNA Pre-anesthesia Checklist: Patient identified, Emergency Drugs available, Suction available and Patient being monitored Patient Re-evaluated:Patient Re-evaluated prior to induction Oxygen Delivery Method: Circle system utilized Preoxygenation: Pre-oxygenation with 100% oxygen Induction Type: IV induction Ventilation: Mask ventilation without difficulty LMA: LMA inserted LMA Size: 4.0 Number of attempts: 1 Airway Equipment and Method: Bite block Placement Confirmation: positive ETCO2 Tube secured with: Tape Dental Injury: Teeth and Oropharynx as per pre-operative assessment

## 2017-08-01 NOTE — H&P (Signed)
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   Post-Nephrectomy Course:  06/2017 - CT - no recurrence   2 - Bladder Mass / Gross Hematuria - pt with Rt wall bladder mass on CT late 2017. BX-proven TaG3 bladder Cancer. No pelvic adenopathy.   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   Recent Course:  01/2017 - Left stent exchange 6x26 contour   4 - Solitary Left kidney / Stage 3 Renal Insufficiency - s/p right nephrectomy 10/2016. Most recent Cr 1.5 post-op.   PMH sig for AFib (not anticaogulated), mild CHF/lasix (still able to walk [facility]n [facility]), gloucoma. Bilateral inguinal hernia repair. His son [name] is ER MD in [facility] and available at [phone]. His PCP is Dr. Eula Fried in [place].   Today " Austin Jackson " is seen to proceed with left stent change for UVJ stricture with solitary kidney. He is on keflex for per-op for some asymptomatic non-clonal bacteruria.     Past Medical History:  Diagnosis Date  . Bilateral lower extremity edema   . Bladder cancer ([place])   . CHF (congestive heart failure) ([facility])    mild  . Chronic atrial fibrillation ([place])    dx 2002--  s/p cardioversion approx 2006 or 2007-- per pt last cardiologist visit at that time-- currently be followed by pcp dr Theodis Blaze in [place]  . CKD (chronic kidney disease), stage III ([place])   . Dysrhythmia    atrial fibrillation ; son [name] states that patient was managed on blood thinner for years for Afbib until cardio decided due to his advanced age to d/c blood dinner , no ASA can be used d/t history of GI bleed so patient is just managed with metoprol. today patient  just reports swelling in bilat lower extremeties for which he  was started on lasix and has now seen some improvement   . Full dentures   . GERD (gastroesophageal reflux disease)   . Glaucoma, both eyes   . Gross hematuria    intermittent  . History of BPH   . History of GI bleed    2013--  per pt unable to find reason but had been on blood thinner,  was transfusion's  . History of kidney stones   . Hypertension   . Hypothyroidism due to amiodarone   . Iron deficiency anemia   . Macular degeneration of both eyes   . OA (osteoarthritis)   . PONV (postoperative nausea and vomiting)   . Renal mass, right    renal pelvis   Past Surgical History:  Procedure Laterality Date  . CARDIOVERSION  2002  . COLONOSCOPY  yrs ago  . CYSTOSCOPY WITH RETROGRADE PYELOGRAM, URETEROSCOPY AND STENT PLACEMENT Left 07/19/2016   Procedure: CYSTOSCOPY WITH RETROGRADE PYELOGRAM,  AND STENT PLACEMENT;  Surgeon: [name], MD;  Location: [facility];  Service: Urology;  Laterality: Left;  . CYSTOSCOPY WITH RETROGRADE PYELOGRAM, URETEROSCOPY AND STENT PLACEMENT Left 01/24/2017   Procedure: CYSTOSCOPY WITH RETROGRADE PYELOGRAM, AND STENT REPLACEMENT;  Surgeon: [name], MD;  Location: [facility];  Service: Urology;  Laterality: Left;  . CYSTOSCOPY/RETROGRADE/URETEROSCOPY Bilateral 09/15/2016   Procedure: CYSTOSCOPY/ RETROGRADE/DIAGNOSITC URETEROSCOPY/URETERAL STENT EXCHANGE;  Surgeon: [name], MD;  Location: [facility];  Service:  Urology;  Laterality: Bilateral;  . EXTRACORPOREAL SHOCK WAVE LITHOTRIPSY  1970's  . EYE SURGERY Left    for glaucoma  . EYE SURGERY Bilateral    cataract extraction with IOL  . INGUINAL HERNIA REPAIR Bilateral 2007 and 1980's  . ROBOT ASSITED LAPAROSCOPIC NEPHROURETERECTOMY Right 10/12/2016   Procedure: XI ROBOT ASSITED LAPAROSCOPIC NEPHROURETERECTOMY;  Surgeon: [name], MD;  Location: [facility];  Service: Urology;  Laterality: Right;  . TRANSURETHRAL RESECTION OF BLADDER TUMOR Right 07/12/2016   Procedure: TRANSURETHRAL RESECTION OF BLADDER  TUMOR (TURBT)/ BILATERAL RETROGRADE PYLEOGRAM/ RIGHT DIAGNOSTIC URETEROSCOPY;  Surgeon: [name], MD;  Location: [facility];  Service: Urology;  Laterality: Right;  . TRANSURETHRAL RESECTION OF PROSTATE  2002    Home Medications:  No current facility-administered medications for this encounter.    Current Outpatient Medications  Medication Sig Dispense Refill Last Dose  . acetaminophen (TYLENOL) 500 MG tablet Take 1,000 mg by mouth every 12 (twelve) hours. 6 am and 6 pm   05/08/2017 at Unknown time  . ALPHAGAN P 0.1 % SOLN Place 1 drop into both eyes 3 (three) times daily.   5 05/07/2017 at Unknown time  . Ferrous Sulfate (IRON) 325 (65 Fe) MG TABS Take 325 mg by mouth daily.    05/08/2017 at Unknown time  . furosemide (LASIX) 20 MG tablet Take 20 mg by mouth 3 (three) times daily.    05/08/2017 at Unknown time  . latanoprost (XALATAN) 0.005 % ophthalmic solution Place 1 drop into both eyes at bedtime.    05/07/2017 at Unknown time  . levothyroxine (SYNTHROID, LEVOTHROID) 50 MCG tablet Take 50 mcg by mouth at bedtime.   05/07/2017 at Unknown time  . metoprolol tartrate (LOPRESSOR) 25 MG tablet Take 12.5 mg by mouth 3 (three) times daily.    05/07/2017 at 1730  . tamsulosin (FLOMAX) 0.4 MG CAPS capsule Take 0.4 mg by mouth at bedtime.    05/07/2017 at Unknown time  . calcium carbonate (TUMS EX) 750 MG chewable tablet Chew 1 tablet (750 mg total) by mouth 2 (two) times daily as needed for heartburn. (Patient not taking: Reported on 07/23/2017)   Not Taking at Unknown time  . cephALEXin (KEFLEX) 500 MG capsule Take 500 mg by mouth 2 (two) times daily.      Allergies:  Allergies  Allergen Reactions  . Codeine Nausea And Vomiting  . Ultram [Tramadol Hcl] Other (See Comments)    Confusion     Family History  Problem Relation Age of Onset  . Stroke Mother   . Heart disease Father   . Heart disease Brother   . Heart disease Paternal Grandfather   . Heart disease Brother    . Kidney cancer Neg Hx   . Diabetes Mellitus II Neg Hx   . CAD Neg Hx    Social History:  reports that he quit smoking about 33 years ago. His smoking use included cigarettes. He has a 60.00 pack-year smoking history. he has never used smokeless tobacco. He reports that he drinks alcohol. He reports that he does not use drugs.  Review of Systems  Constitutional: Negative.   HENT: Negative.   Eyes: Negative.   Cardiovascular: Negative.   Gastrointestinal: Negative.   Genitourinary: Negative.   Musculoskeletal: Negative.   Skin: Negative.   Psychiatric/Behavioral: Negative.     Physical Exam:  Vital signs in last 24 hours:   Physical Exam  Constitutional: He appears well-developed.  Elderly but AOx3.   HENT:  Head:  Normocephalic.  Eyes: Pupils are equal, round, and reactive to light.  Cardiovascular: Normal rate.  Respiratory: Effort normal.  GI: Soft.  Prior scars w/o hernias.   Genitourinary:  Genitourinary Comments: NO CVAT.   Musculoskeletal:  Stable mild LE edema.   Neurological: He is alert.  Skin: Skin is warm.  Psychiatric: He has a normal mood and affect.    Laboratory Data:  No results found for this or any previous visit (from the past 24 hour(s)). No results found for this or any previous visit (from the past 240 hour(s)). Creatinine: Recent Labs    07/30/17 1437  CREATININE 1.64*     Plan:   Proceed as planned with LEFT ureteral stent change. Risks, benefits, alternatives discussed previously and reiterated today.    Frazier Balfour 08/01/2017, 8:21 AM

## 2017-08-01 NOTE — Anesthesia Preprocedure Evaluation (Signed)
Anesthesia Evaluation  Patient identified by MRN, date of birth, ID band Patient awake    Reviewed: Allergy & Precautions, NPO status , Patient's Chart, lab work & pertinent test results  Airway Mallampati: II  TM Distance: >3 FB Neck ROM: Full    Dental no notable dental hx.    Pulmonary neg pulmonary ROS, former smoker,    Pulmonary exam normal breath sounds clear to auscultation       Cardiovascular hypertension, Pt. on medications and Pt. on home beta blockers + dysrhythmias Atrial Fibrillation  Rhythm:Irregular Rate:Normal     Neuro/Psych negative neurological ROS  negative psych ROS   GI/Hepatic negative GI ROS, Neg liver ROS,   Endo/Other  Hypothyroidism   Renal/GU negative Renal ROS  negative genitourinary   Musculoskeletal negative musculoskeletal ROS (+)   Abdominal   Peds negative pediatric ROS (+)  Hematology  (+) anemia ,   Anesthesia Other Findings   Reproductive/Obstetrics negative OB ROS                             Anesthesia Physical Anesthesia Plan  ASA: III  Anesthesia Plan: MAC   Post-op Pain Management:    Induction: Intravenous  PONV Risk Score and Plan: 2 and Ondansetron  Airway Management Planned: Simple Face Mask  Additional Equipment:   Intra-op Plan:   Post-operative Plan:   Informed Consent: I have reviewed the patients History and Physical, chart, labs and discussed the procedure including the risks, benefits and alternatives for the proposed anesthesia with the patient or authorized representative who has indicated his/her understanding and acceptance.   Dental advisory given  Plan Discussed with: CRNA and Surgeon  Anesthesia Plan Comments:         Anesthesia Quick Evaluation

## 2017-08-01 NOTE — Brief Op Note (Signed)
08/01/2017  2:06 PM  PATIENT:  Austin Jackson  84 y.o. male  PRE-OPERATIVE DIAGNOSIS:  LEFT URETERAL STRICTURE  POST-OPERATIVE DIAGNOSIS:  LEFT URETERAL STRICTURE  PROCEDURE:  Procedure(s): CYSTOSCOPY WITH STENT REPLACEMENT (Left)  SURGEON:  Surgeon(s) and Role:    * [name], MD - Primary  PHYSICIAN ASSISTANT:   ASSISTANTS: none   ANESTHESIA:   general  EBL:  none   BLOOD ADMINISTERED:none  DRAINS: none   LOCAL MEDICATIONS USED:  NONE  SPECIMEN:  No Specimen  DISPOSITION OF SPECIMEN:  N/A  COUNTS:  YES  TOURNIQUET:  * No tourniquets in log *  DICTATION: .Other Dictation: Dictation Number [id number]  PLAN OF CARE: Discharge to home after PACU  PATIENT DISPOSITION:  PACU - hemodynamically stable.   Delay start of Pharmacological VTE agent (>24hrs) due to surgical blood loss or risk of bleeding: not applicable

## 2017-08-01 NOTE — Discharge Instructions (Signed)
1 - You may have urinary urgency (bladder spasms) and bloody urine on / off with stent in place. This is normal. ° °2 - Call MD or go to ER for fever >102, severe pain / nausea / vomiting not relieved by medications, or acute change in medical status ° °

## 2017-08-01 NOTE — Anesthesia Postprocedure Evaluation (Signed)
Anesthesia Post Note  Patient: Austin Jackson  Procedure(s) Performed: CYSTOSCOPY WITH STENT REPLACEMENT (Left )     Patient location during evaluation: PACU Anesthesia Type: General Level of consciousness: awake and alert Pain management: pain level controlled Vital Signs Assessment: post-procedure vital signs reviewed and stable Respiratory status: spontaneous breathing, nonlabored ventilation, respiratory function stable and patient connected to nasal cannula oxygen Cardiovascular status: blood pressure returned to baseline and stable Postop Assessment: no apparent nausea or vomiting Anesthetic complications: no    Last Vitals:  Vitals:   08/01/17 1545 08/01/17 1552  BP: 109/70 118/69  Pulse: 79 84  Resp: (!) 22 20  Temp: (!) 36.2 C (!) 36.4 C  SpO2: 92% 94%    Last Pain:  Vitals:   08/01/17 1530  TempSrc:   PainSc: Asleep                 Phoebe Marter S

## 2017-08-01 NOTE — Transfer of Care (Signed)
Immediate Anesthesia Transfer of Care Note  Patient: Austin Jackson  Procedure(s) Performed: CYSTOSCOPY WITH STENT REPLACEMENT (Left )  Patient Location: PACU  Anesthesia Type:General  Level of Consciousness: awake and drowsy  Airway & Oxygen Therapy: Patient Spontanous Breathing and Patient connected to face mask oxygen  Post-op Assessment: Report given to RN and Post -op Vital signs reviewed and stable  Post vital signs: Reviewed and stable  Last Vitals:  Vitals:   08/01/17 1120  BP: (!) 126/53  Pulse: (!) 51  Resp: 16  Temp: 36.5 C  SpO2: 96%    Last Pain:  Vitals:   08/01/17 1208  TempSrc:   PainSc: 2          Complications: No apparent anesthesia complications

## 2017-08-02 ENCOUNTER — Encounter (HOSPITAL_COMMUNITY): Payer: Self-pay | Admitting: Urology

## 2017-08-02 NOTE — Op Note (Signed)
NAME:  Austin Jackson, HEARNS                   ACCOUNT NO.:  MEDICAL RECORD NO.:  [id number]  LOCATION:                                 FACILITY:  PHYSICIAN:  [name], MD     DATE OF BIRTH:  July 08, 1920  DATE OF PROCEDURE: 08/01/2017                              OPERATIVE REPORT   DIAGNOSES:  Chronic left ureteral stricture, solitary kidney, history of bladder and ureteral cancer.  PROCEDURES: 1. Cystoscopy with left retrograde pyelogram and interpretation. 2. Exchange of left ureteral stent, 6 x 26 Contour, no tether.  ESTIMATED BLOOD LOSS:  Nil.  COMPLICATION:  None.  SPECIMEN:  None.  FINDINGS: 1. Tortuous left ureter with nearly backwards insertion of UPJ and     moderate hydroureteronephrosis. 2. Successful replacement of left ureteral stent, proximal in the     renal pelvis and distal in the urinary bladder. 3. Solitary left kidney.  INDICATION:  [name] is a pleasant, but quite elderly 84 year old gentleman with history of right ureteral cancer, status post nephroureterectomy last year.  He has also had some small volume bladder cancer as well.  He unfortunately due to bladder cancer has a chronic left ureterovesical junction stricture, which was managed with chronic stenting change every 6 months or so.  He has done quite well with this. He is due for stent change.  Informed consent was obtained and placed in the medical record.  PROCEDURE IN DETAIL:  The patient being Austin Jackson, was verified. Procedure being left ureteral stent exchange was confirmed.  Procedure was carried out.  Time-out was performed.  Intravenous antibiotics were administered.  General LMA anesthesia introduced.  The patient was placed into a low lithotomy position.  Sterile field was created by prepping and draping the patient's penis, perineum and proximal thighs using iodine.  Cystourethroscopy was performed using a 22-French rigid cystoscope with offset lens.  Inspection of the  anterior and posterior urethra unremarkable.  Inspection of the urinary bladder revealed a solitary left ureteral orifice to the distal end of the stent seen exiting, this was grasped, brought to the level of the urethral meatus. An attempt was made to wire the stent.  Due to the chronicity, it was unable to be wire; therefore, it was removed, set aside for discard.  It was inspected and intact.  The left ureteral orifice was cannulated with a 6-French end-hole catheter and left retrograde pyelogram was obtained.  Left retrograde pyelogram demonstrated a single left ureter with single- system left kidney.  There was moderate hydroureteronephrosis to the level of the ureterovesical junction.  There was significant tortuosity of the proximal ureter with nearly backwards insertion of the ureteropelvic junction.  The Zip wire was attempted to be advanced up to the level of the kidney; however, due to the extreme tortuosity, it was not successful.  As such, the open-ended catheter was exchanged for an angle tipped KMP catheter and using a Step technique with intervening fluoroscopy, the Sensor wire was navigated to the area of the hydronephrotic kidney and coiled in place, and a new 6 x 26 Contour-type stent was placed over the remaining wire using cystoscopic and fluoroscopic guidance.  Good  proximal and distal deployment were noted. Bladder was emptied per cystoscope.  Procedure was then terminated.  The patient tolerated the procedure well.  There were no immediate periprocedural complications.  The patient was taken to the postanesthesia care unit in stable condition.          ______________________________ [name], MD     TM/MEDQ  D:  08/01/2017  T:  08/01/2017  Job:  [id number]

## 2017-08-27 DIAGNOSIS — Z0000 Encounter for general adult medical examination without abnormal findings: Secondary | ICD-10-CM | POA: Diagnosis not present

## 2017-08-27 DIAGNOSIS — I5032 Chronic diastolic (congestive) heart failure: Secondary | ICD-10-CM | POA: Diagnosis not present

## 2017-08-27 DIAGNOSIS — N182 Chronic kidney disease, stage 2 (mild): Secondary | ICD-10-CM | POA: Diagnosis not present

## 2017-08-27 DIAGNOSIS — N400 Enlarged prostate without lower urinary tract symptoms: Secondary | ICD-10-CM | POA: Diagnosis not present

## 2017-08-27 DIAGNOSIS — E038 Other specified hypothyroidism: Secondary | ICD-10-CM | POA: Diagnosis not present

## 2017-08-27 DIAGNOSIS — I10 Essential (primary) hypertension: Secondary | ICD-10-CM | POA: Diagnosis not present

## 2017-10-02 DIAGNOSIS — Z0000 Encounter for general adult medical examination without abnormal findings: Secondary | ICD-10-CM | POA: Diagnosis not present

## 2017-10-02 DIAGNOSIS — K523 Indeterminate colitis: Secondary | ICD-10-CM | POA: Diagnosis not present

## 2017-10-02 DIAGNOSIS — I5023 Acute on chronic systolic (congestive) heart failure: Secondary | ICD-10-CM | POA: Diagnosis not present

## 2017-10-02 DIAGNOSIS — I5032 Chronic diastolic (congestive) heart failure: Secondary | ICD-10-CM | POA: Diagnosis not present

## 2017-10-02 DIAGNOSIS — N182 Chronic kidney disease, stage 2 (mild): Secondary | ICD-10-CM | POA: Diagnosis not present

## 2017-10-02 DIAGNOSIS — E038 Other specified hypothyroidism: Secondary | ICD-10-CM | POA: Diagnosis not present

## 2017-10-02 DIAGNOSIS — I700 Atherosclerosis of aorta: Secondary | ICD-10-CM | POA: Diagnosis not present

## 2017-10-02 DIAGNOSIS — N400 Enlarged prostate without lower urinary tract symptoms: Secondary | ICD-10-CM | POA: Diagnosis not present

## 2017-10-02 DIAGNOSIS — R0602 Shortness of breath: Secondary | ICD-10-CM | POA: Diagnosis not present

## 2017-10-02 DIAGNOSIS — I10 Essential (primary) hypertension: Secondary | ICD-10-CM | POA: Diagnosis not present

## 2017-10-20 ENCOUNTER — Emergency Department (HOSPITAL_COMMUNITY): Payer: Medicare Other

## 2017-10-20 ENCOUNTER — Encounter (HOSPITAL_COMMUNITY): Payer: Self-pay | Admitting: Emergency Medicine

## 2017-10-20 ENCOUNTER — Other Ambulatory Visit: Payer: Self-pay

## 2017-10-20 ENCOUNTER — Inpatient Hospital Stay (HOSPITAL_COMMUNITY)
Admission: EM | Admit: 2017-10-20 | Discharge: 2017-10-23 | DRG: 378 | Disposition: A | Discharge status: Home Health | Payer: Medicare Other | Attending: Internal Medicine | Admitting: Internal Medicine

## 2017-10-20 DIAGNOSIS — K2900 Acute gastritis without bleeding: Secondary | ICD-10-CM | POA: Diagnosis present

## 2017-10-20 DIAGNOSIS — I482 Chronic atrial fibrillation, unspecified: Secondary | ICD-10-CM | POA: Diagnosis present

## 2017-10-20 DIAGNOSIS — E039 Hypothyroidism, unspecified: Secondary | ICD-10-CM | POA: Diagnosis present

## 2017-10-20 DIAGNOSIS — Z66 Do not resuscitate: Secondary | ICD-10-CM | POA: Diagnosis present

## 2017-10-20 DIAGNOSIS — Z79899 Other long term (current) drug therapy: Secondary | ICD-10-CM | POA: Diagnosis not present

## 2017-10-20 DIAGNOSIS — D62 Acute posthemorrhagic anemia: Secondary | ICD-10-CM | POA: Diagnosis present

## 2017-10-20 DIAGNOSIS — F05 Delirium due to known physiological condition: Secondary | ICD-10-CM | POA: Diagnosis present

## 2017-10-20 DIAGNOSIS — R319 Hematuria, unspecified: Secondary | ICD-10-CM | POA: Diagnosis not present

## 2017-10-20 DIAGNOSIS — K921 Melena: Secondary | ICD-10-CM | POA: Diagnosis not present

## 2017-10-20 DIAGNOSIS — I10 Essential (primary) hypertension: Secondary | ICD-10-CM | POA: Diagnosis not present

## 2017-10-20 DIAGNOSIS — H409 Unspecified glaucoma: Secondary | ICD-10-CM | POA: Diagnosis present

## 2017-10-20 DIAGNOSIS — K8020 Calculus of gallbladder without cholecystitis without obstruction: Secondary | ICD-10-CM | POA: Diagnosis not present

## 2017-10-20 DIAGNOSIS — I129 Hypertensive chronic kidney disease with stage 1 through stage 4 chronic kidney disease, or unspecified chronic kidney disease: Secondary | ICD-10-CM | POA: Diagnosis present

## 2017-10-20 DIAGNOSIS — C641 Malignant neoplasm of right kidney, except renal pelvis: Secondary | ICD-10-CM | POA: Diagnosis present

## 2017-10-20 DIAGNOSIS — K259 Gastric ulcer, unspecified as acute or chronic, without hemorrhage or perforation: Secondary | ICD-10-CM | POA: Diagnosis not present

## 2017-10-20 DIAGNOSIS — N1330 Unspecified hydronephrosis: Secondary | ICD-10-CM | POA: Diagnosis present

## 2017-10-20 DIAGNOSIS — H3530 Unspecified macular degeneration: Secondary | ICD-10-CM | POA: Diagnosis present

## 2017-10-20 DIAGNOSIS — N183 Chronic kidney disease, stage 3 unspecified: Secondary | ICD-10-CM | POA: Diagnosis present

## 2017-10-20 DIAGNOSIS — K31819 Angiodysplasia of stomach and duodenum without bleeding: Secondary | ICD-10-CM | POA: Diagnosis present

## 2017-10-20 DIAGNOSIS — Z87891 Personal history of nicotine dependence: Secondary | ICD-10-CM

## 2017-10-20 DIAGNOSIS — R1030 Lower abdominal pain, unspecified: Secondary | ICD-10-CM | POA: Diagnosis not present

## 2017-10-20 DIAGNOSIS — R1310 Dysphagia, unspecified: Secondary | ICD-10-CM | POA: Diagnosis present

## 2017-10-20 DIAGNOSIS — K254 Chronic or unspecified gastric ulcer with hemorrhage: Secondary | ICD-10-CM | POA: Diagnosis not present

## 2017-10-20 DIAGNOSIS — K219 Gastro-esophageal reflux disease without esophagitis: Secondary | ICD-10-CM | POA: Diagnosis present

## 2017-10-20 DIAGNOSIS — K225 Diverticulum of esophagus, acquired: Secondary | ICD-10-CM | POA: Diagnosis present

## 2017-10-20 DIAGNOSIS — K922 Gastrointestinal hemorrhage, unspecified: Secondary | ICD-10-CM

## 2017-10-20 DIAGNOSIS — B3781 Candidal esophagitis: Secondary | ICD-10-CM | POA: Diagnosis present

## 2017-10-20 DIAGNOSIS — R109 Unspecified abdominal pain: Secondary | ICD-10-CM

## 2017-10-20 DIAGNOSIS — Z8551 Personal history of malignant neoplasm of bladder: Secondary | ICD-10-CM | POA: Diagnosis not present

## 2017-10-20 DIAGNOSIS — Z905 Acquired absence of kidney: Secondary | ICD-10-CM | POA: Diagnosis not present

## 2017-10-20 LAB — CBC WITH DIFFERENTIAL/PLATELET
BASOS ABS: 0.1 10*3/uL (ref 0.0–0.1)
Basophils Relative: 1 %
EOS PCT: 2 %
Eosinophils Absolute: 0.1 10*3/uL (ref 0.0–0.7)
HCT: 26.3 % — ABNORMAL LOW (ref 39.0–52.0)
Hemoglobin: 8.6 g/dL — ABNORMAL LOW (ref 13.0–17.0)
LYMPHS PCT: 38 %
Lymphs Abs: 1.4 10*3/uL (ref 0.7–4.0)
MCH: 32.2 pg (ref 26.0–34.0)
MCHC: 32.7 g/dL (ref 30.0–36.0)
MCV: 98.5 fL (ref 78.0–100.0)
Monocytes Absolute: 0.4 10*3/uL (ref 0.1–1.0)
Monocytes Relative: 11 %
NEUTROS ABS: 1.7 10*3/uL (ref 1.7–7.7)
NEUTROS PCT: 48 %
PLATELETS: 172 10*3/uL (ref 150–400)
RBC: 2.67 MIL/uL — AB (ref 4.22–5.81)
RDW: 15.9 % — ABNORMAL HIGH (ref 11.5–15.5)
WBC: 3.6 10*3/uL — AB (ref 4.0–10.5)

## 2017-10-20 LAB — CBC
HCT: 27.6 % — ABNORMAL LOW (ref 39.0–52.0)
Hemoglobin: 9 g/dL — ABNORMAL LOW (ref 13.0–17.0)
MCH: 32 pg (ref 26.0–34.0)
MCHC: 32.6 g/dL (ref 30.0–36.0)
MCV: 98.2 fL (ref 78.0–100.0)
PLATELETS: 190 10*3/uL (ref 150–400)
RBC: 2.81 MIL/uL — AB (ref 4.22–5.81)
RDW: 15.9 % — ABNORMAL HIGH (ref 11.5–15.5)
WBC: 4.9 10*3/uL (ref 4.0–10.5)

## 2017-10-20 LAB — URINALYSIS, ROUTINE W REFLEX MICROSCOPIC
BACTERIA UA: NONE SEEN
BILIRUBIN URINE: NEGATIVE
Glucose, UA: NEGATIVE mg/dL
KETONES UR: NEGATIVE mg/dL
NITRITE: NEGATIVE
PROTEIN: 30 mg/dL — AB
Specific Gravity, Urine: 1.01 (ref 1.005–1.030)
pH: 7 (ref 5.0–8.0)

## 2017-10-20 LAB — BASIC METABOLIC PANEL
Anion gap: 7 (ref 5–15)
BUN: 48 mg/dL — ABNORMAL HIGH (ref 6–20)
CO2: 26 mmol/L (ref 22–32)
Calcium: 8.7 mg/dL — ABNORMAL LOW (ref 8.9–10.3)
Chloride: 103 mmol/L (ref 101–111)
Creatinine, Ser: 1.77 mg/dL — ABNORMAL HIGH (ref 0.61–1.24)
GFR calc Af Amer: 36 mL/min — ABNORMAL LOW (ref >60)
GFR, EST NON AFRICAN AMERICAN: 31 mL/min — AB (ref >60)
GLUCOSE: 151 mg/dL — AB (ref 65–99)
POTASSIUM: 4.6 mmol/L (ref 3.5–5.1)
SODIUM: 136 mmol/L (ref 135–145)

## 2017-10-20 LAB — PROTIME-INR
INR: 1.23
PROTHROMBIN TIME: 15.4 s — AB (ref 11.4–15.2)

## 2017-10-20 LAB — POC OCCULT BLOOD, ED: FECAL OCCULT BLD: POSITIVE — AB

## 2017-10-20 MED ORDER — SODIUM CHLORIDE 0.9 % IV SOLN
INTRAVENOUS | Status: DC
  Administered 2017-10-21 – 2017-10-23 (×4): via INTRAVENOUS

## 2017-10-20 MED ORDER — LEVOTHYROXINE SODIUM 50 MCG PO TABS
75.0000 ug | ORAL_TABLET | Freq: Every day | ORAL | Status: DC
  Administered 2017-10-21 – 2017-10-23 (×3): 75 ug via ORAL
  Filled 2017-10-20 (×3): qty 1

## 2017-10-20 MED ORDER — ONDANSETRON HCL 4 MG PO TABS: 4.0000 mg | ORAL_TABLET | Freq: Four times a day (QID) | ORAL | Status: DC | PRN

## 2017-10-20 MED ORDER — SODIUM CHLORIDE 0.9 % IV SOLN: 8.0000 mg/h | INTRAVENOUS | Status: DC

## 2017-10-20 MED ORDER — ONDANSETRON HCL 4 MG/2ML IJ SOLN: 4.0000 mg | Freq: Four times a day (QID) | INTRAMUSCULAR | Status: DC | PRN

## 2017-10-20 MED ORDER — SODIUM CHLORIDE 0.9 % IV SOLN
8.0000 mg/h | INTRAVENOUS | Status: DC
  Administered 2017-10-20 – 2017-10-23 (×6): 8 mg/h via INTRAVENOUS
  Filled 2017-10-20 (×11): qty 80

## 2017-10-20 MED ORDER — BRIMONIDINE TARTRATE 0.15 % OP SOLN
1.0000 [drp] | Freq: Three times a day (TID) | OPHTHALMIC | Status: DC
  Administered 2017-10-20 – 2017-10-23 (×8): 1 [drp] via OPHTHALMIC
  Filled 2017-10-20: qty 5

## 2017-10-20 MED ORDER — SODIUM CHLORIDE 0.9 % IV SOLN
80.0000 mg | Freq: Once | INTRAVENOUS | Status: AC
  Administered 2017-10-20: 80 mg via INTRAVENOUS
  Filled 2017-10-20: qty 80

## 2017-10-20 MED ORDER — ACETAMINOPHEN 500 MG PO TABS
1000.0000 mg | ORAL_TABLET | Freq: Two times a day (BID) | ORAL | Status: DC
  Administered 2017-10-21 – 2017-10-23 (×5): 1000 mg via ORAL
  Filled 2017-10-20 (×5): qty 2

## 2017-10-20 MED ORDER — LATANOPROST 0.005 % OP SOLN
1.0000 [drp] | Freq: Every day | OPHTHALMIC | Status: DC
  Administered 2017-10-20 – 2017-10-22 (×3): 1 [drp] via OPHTHALMIC
  Filled 2017-10-20: qty 2.5

## 2017-10-20 MED ORDER — SODIUM CHLORIDE 0.9 % IV SOLN
Freq: Once | INTRAVENOUS | Status: AC
  Administered 2017-10-21: 03:00:00 via INTRAVENOUS

## 2017-10-20 MED ORDER — FERROUS SULFATE 325 (65 FE) MG PO TABS
325.0000 mg | ORAL_TABLET | Freq: Two times a day (BID) | ORAL | Status: DC
  Administered 2017-10-20: 325 mg via ORAL
  Filled 2017-10-20: qty 1

## 2017-10-20 MED ORDER — METOPROLOL TARTRATE 25 MG PO TABS
12.5000 mg | ORAL_TABLET | Freq: Two times a day (BID) | ORAL | Status: DC
  Administered 2017-10-20 – 2017-10-23 (×5): 12.5 mg via ORAL
  Filled 2017-10-20 (×6): qty 1

## 2017-10-20 MED ORDER — TAMSULOSIN HCL 0.4 MG PO CAPS
0.4000 mg | ORAL_CAPSULE | Freq: Every day | ORAL | Status: DC
  Administered 2017-10-20 – 2017-10-22 (×3): 0.4 mg via ORAL
  Filled 2017-10-20 (×3): qty 1

## 2017-10-20 MED ORDER — PANTOPRAZOLE SODIUM 40 MG IV SOLR: 80.0000 mg | Freq: Once | INTRAVENOUS | Status: DC

## 2017-10-20 NOTE — ED Notes (Signed)
Assisted patient onto bedside commode at this time.

## 2017-10-20 NOTE — ED Triage Notes (Addendum)
Patient presents with son stating pt began having hematuria yesterday morning with left sided flank pain. Son adds that pt had right kidney removed and then had stent placed in left kidney about 3 months ago.

## 2017-10-20 NOTE — ED Notes (Signed)
Patient transported to CT 

## 2017-10-20 NOTE — ED Notes (Signed)
ED TO INPATIENT HANDOFF REPORT  Name/Age/Gender Austin Jackson 84 y.o. male  Code Status Code Status History    Date Active Date Inactive Code Status Order ID Comments User Context   05/08/2017 0538 05/09/2017 1734 DNR [id number]  [name], MD Inpatient   07/19/2016 0330 07/23/2016 1707 DNR [id number]  [name], MD ED   07/12/2016 1426 07/13/2016 1249 Full Code [id number]  [name], MD Inpatient    Questions for Most Recent Historical Code Status (Order [id number])    Question Answer Comment   In the event of cardiac or respiratory ARREST Do not call a "code blue"    In the event of cardiac or respiratory ARREST Do not perform Intubation, CPR, defibrillation or ACLS    In the event of cardiac or respiratory ARREST Use medication by any route, position, wound care, and other measures to relive pain and suffering. May use oxygen, suction and manual treatment of airway obstruction as needed for comfort.       Home/SNF/Other Home  Chief Complaint Blood in Urine   Level of Care/Admitting Diagnosis ED Disposition    ED Disposition Condition [facility] Area: [place] [[id number]]  Level of Care: Telemetry [5]  Admit to tele based on following criteria: Other see comments  Comments: GI bleed  Diagnosis: [place] [[id number]]  Admitting Physician: Elwyn Reach [[id number]]  Attending Physician: Elwyn Reach [[id number]]  Estimated length of stay: 3 - 4 days  Certification:: I certify this patient will need inpatient services for at least 2 midnights  PT Class (Do Not Modify): Inpatient [101]  PT Acc Code (Do Not Modify): Private [1]       Medical History Past Medical History:  Diagnosis Date  . Bilateral lower extremity edema   . Bladder cancer ([place])   . CHF (congestive heart failure) ([facility])    mild  . Chronic atrial fibrillation ([place])    dx 2002--  s/p cardioversion approx 2006 or 2007-- per pt last cardiologist visit at that  time-- currently be followed by pcp dr Theodis Blaze in [place]  . CKD (chronic kidney disease), stage III ([place])   . Dysrhythmia    atrial fibrillation ; son [name] states that patient was managed on blood thinner for years for Afbib until cardio decided due to his advanced age to d/c blood dinner , no ASA can be used d/t history of GI bleed so patient is just managed with metoprol. today patient  just reports swelling in bilat lower extremeties for which he was started on lasix and has now seen some improvement   . Full dentures   . GERD (gastroesophageal reflux disease)   . Glaucoma, both eyes   . Gross hematuria    intermittent  . History of BPH   . History of GI bleed    2013--  per pt unable to find reason but had been on blood thinner,  was transfusion's  . History of kidney stones   . Hypertension   . Hypothyroidism due to amiodarone   . Iron deficiency anemia   . Macular degeneration of both eyes   . OA (osteoarthritis)   . PONV (postoperative nausea and vomiting)   . Renal mass, right    renal pelvis    Allergies Allergies  Allergen Reactions  . Codeine Nausea And Vomiting  . Ultram [Tramadol Hcl] Other (See Comments)    Confusion     IV Location/Drains/Wounds Patient Lines/Drains/Airways Status  Active Line/Drains/Airways    Name:   Placement date:   Placement time:   Site:   Days:   Peripheral IV 10/20/17 Left Antecubital   10/20/17    2050    Antecubital   less than 1   Peripheral IV 10/20/17 Left Forearm   10/20/17    2102    Forearm   less than 1   Ureteral Drain/Stent Left ureter 6 Fr.   08/01/17    1357    Left ureter   80   Incision (Closed) 07/19/16 Perineum   07/19/16    1930     458   Incision (Closed) 10/12/16 Abdomen Other (Comment)   10/12/16    1032     373   Incision - 1 Port Abdomen Mid;Lower;Other (Comment)   10/12/16    1024     373   Incision - 6 Ports Abdomen 1: Right;Lateral 2: Right;Upper;Lateral 3: Right;Mid 4: Right;Medial;Upper 5:  Right;Medial;Lower 6: Umbilicus;Lower   10/12/16    0812     373          Labs/Imaging Results for orders placed or performed during the hospital encounter of 10/20/17 (from the past 48 hour(s))  CBC with Differential     Status: Abnormal   Collection Time: 10/20/17  5:43 PM  Result Value Ref Range   WBC 3.6 (L) 4.0 - 10.5 K/uL   RBC 2.67 (L) 4.22 - 5.81 MIL/uL   Hemoglobin 8.6 (L) 13.0 - 17.0 g/dL   HCT 26.3 (L) 39.0 - 52.0 %   MCV 98.5 78.0 - 100.0 fL   MCH 32.2 26.0 - 34.0 pg   MCHC 32.7 30.0 - 36.0 g/dL   RDW 15.9 (H) 11.5 - 15.5 %   Platelets 172 150 - 400 K/uL   Neutrophils Relative % 48 %   Neutro Abs 1.7 1.7 - 7.7 K/uL   Lymphocytes Relative 38 %   Lymphs Abs 1.4 0.7 - 4.0 K/uL   Monocytes Relative 11 %   Monocytes Absolute 0.4 0.1 - 1.0 K/uL   Eosinophils Relative 2 %   Eosinophils Absolute 0.1 0.0 - 0.7 K/uL   Basophils Relative 1 %   Basophils Absolute 0.1 0.0 - 0.1 K/uL    Comment: Performed at [facility], [place] [address] [place], [place] [address]  Basic metabolic panel     Status: Abnormal   Collection Time: 10/20/17  5:43 PM  Result Value Ref Range   Sodium 136 135 - 145 mmol/L   Potassium 4.6 3.5 - 5.1 mmol/L   Chloride 103 101 - 111 mmol/L   CO2 26 22 - 32 mmol/L   Glucose, Bld 151 (H) 65 - 99 mg/dL   BUN 48 (H) 6 - 20 mg/dL   Creatinine, Ser 1.77 (H) 0.61 - 1.24 mg/dL   Calcium 8.7 (L) 8.9 - 10.3 mg/dL   GFR calc non Af Amer 31 (L) >60 mL/min   GFR calc Af Amer 36 (L) >60 mL/min    Comment: (NOTE) The eGFR has been calculated using the CKD EPI equation. This calculation has not been validated in all clinical situations. eGFR's persistently <60 mL/min signify possible Chronic Kidney Disease.    Anion gap 7 5 - 15    Comment: Performed at [facility], [place] [address] [place], [place] [address]  Urinalysis, Routine w reflex microscopic     Status: Abnormal   Collection Time: 10/20/17  5:43 PM  Result Value  Ref Range  Color, Urine YELLOW YELLOW   APPearance CLOUDY (A) CLEAR   Specific Gravity, Urine 1.010 1.005 - 1.030   pH 7.0 5.0 - 8.0   Glucose, UA NEGATIVE NEGATIVE mg/dL   Hgb urine dipstick LARGE (A) NEGATIVE   Bilirubin Urine NEGATIVE NEGATIVE   Ketones, ur NEGATIVE NEGATIVE mg/dL   Protein, ur 30 (A) NEGATIVE mg/dL   Nitrite NEGATIVE NEGATIVE   Leukocytes, UA LARGE (A) NEGATIVE   RBC / HPF TOO NUMEROUS TO COUNT 0 - 5 RBC/hpf   WBC, UA TOO NUMEROUS TO COUNT 0 - 5 WBC/hpf   Bacteria, UA NONE SEEN NONE SEEN   Squamous Epithelial / LPF 0-5 (A) NONE SEEN   WBC Clumps PRESENT    Mucus PRESENT    Budding Yeast PRESENT     Comment: Performed at [facility], [place] [address] [place], [place] [address]  POC occult blood, ED     Status: Abnormal   Collection Time: 10/20/17  8:11 PM  Result Value Ref Range   Fecal Occult Bld POSITIVE (A) NEGATIVE  Protime-INR     Status: Abnormal   Collection Time: 10/20/17  9:05 PM  Result Value Ref Range   Prothrombin Time 15.4 (H) 11.4 - 15.2 seconds   INR 1.23     Comment: Performed at [facility] [place] [address], [place], [place] [address]  Type and screen [place]     Status: None   Collection Time: 10/20/17  9:07 PM  Result Value Ref Range   ABO/RH(D) A POS    Antibody Screen NEG    Sample Expiration      10/23/2017 Performed at [facility], [place] [address], [place],  [address]    Ct Renal Stone Study  Result Date: 10/20/2017 CLINICAL DATA:  Hematuria, LEFT-sided flank pain, prior RIGHT nephrectomy, history bladder cancer, prior LEFT ureteral stent, CHF, atrial fibrillation, GERD, hypertension, former smoker, stage III chronic kidney disease EXAM: CT ABDOMEN AND PELVIS WITHOUT CONTRAST TECHNIQUE: Multidetector CT imaging of the abdomen and pelvis was performed following the standard protocol without IV contrast. Sagittal and coronal MPR images  reconstructed from axial data set. Oral contrast was not administered. COMPARISON:  07/05/2017 FINDINGS: Lower chest: Increased interstitial changes at the lung bases particularly in periphery of RIGHT lower lobe. Subsegmental atelectasis at lung bases. Enlargement of cardiac chambers diffusely. Hepatobiliary: Contracted gallbladder containing a small calculus. Dilated IVC and hepatic veins question passive congestion of liver. No focal hepatic mass lesion. Pancreas: Atrophic, otherwise unremarkable Spleen: Normal appearance.  Incidental note of a small splenule. Adrenals/Urinary Tract: Prior RIGHT nephrectomy. LEFT adrenal nodule 16 x 12 mm unchanged. No LEFT renal mass. Dilated LEFT renal collecting system and ureter containing a ureteral stent which extends into the urinary bladder. Bladder unremarkable. Enlarged prostate gland with central filling defect question prior TURP. No definite bladder mass visualized. Stomach/Bowel: Descending and sigmoid diverticulosis without evidence of diverticulitis. Stomach and bowel loops otherwise normal appearance Vascular/Lymphatic: Extensive atherosclerotic calcifications of coronary arteries, aorta, iliac arteries, and visceral branch vessels. High-grade narrowing of RIGHT common iliac artery and LEFT external iliac artery. Aorta normal caliber. No adenopathy. Few RIGHT pelvic varices. Reproductive: N/A Other: No free air or free fluid. Ventral hernia containing fat in epigastrium, defect measuring 12 x 8 mm. No bowel herniation. No acute inflammatory process. Musculoskeletal: Osseous demineralization with degenerative changes and scoliosis of lumbar spine. Spinal stenosis at L2-L3 and L3-L4. IMPRESSION: Cholelithiasis. Slightly increased chronic interstitial lung disease  changes at lung bases with mild bibasilar atelectasis. Persistent LEFT hydronephrosis and hydroureter despite ureteral stent. Distal colonic diverticulosis without evidence of diverticulitis. Small ventral  hernia in epigastrium containing fat. No acute intra-abdominal or intrapelvic process identified. Extensive atherosclerotic calcifications including coronary arteries and suspected high-grade narrowings of the RIGHT common iliac and LEFT external iliac arteries. Aortic Atherosclerosis (ICD10-I70.0). Electronically Signed   By: Lavonia Dana M.D.   On: 10/20/2017 18:16    Pending Labs Unresulted Labs (From admission, onward)   Start     Ordered   10/20/17 2000  Occult blood card to lab, stool  Once,   STAT     10/20/17 1959   Signed and Held  Type and screen [place]  Once,   R    Comments:  [place]    Signed and Held   Signed and Held  Prepare RBC  (Adult Blood Administration - Red Blood Cells)  Once,   R    Question Answer Comment  # of Units 2 units   Transfusion Indications Actively Bleeding / GI Bleed   If emergent release call blood bank [facility] [phone]      Signed and Held   Signed and Held  Comprehensive metabolic panel  Tomorrow morning,   R     Signed and Held   Signed and Held  CBC  Now then every 6 hours,   R     Signed and Held   Signed and Held  Occult blood card to lab, stool  Once,   R     Signed and Held      Vitals/Pain Today's Vitals   10/20/17 1644 10/20/17 2135 10/20/17 2212  BP: 117/63 120/67 126/76  Pulse: (!) 102 (!) 101 98  Resp:  16 15  Temp: 98 F (36.7 C)  98.1 F (36.7 C)  TempSrc: Oral  Oral  SpO2: 98% 99% 99%  PainSc: 6   0-No pain    Isolation Precautions No active isolations  Medications Medications  pantoprazole (PROTONIX) 80 mg in sodium chloride 0.9 % 250 mL (0.32 mg/mL) infusion (8 mg/hr Intravenous New Bag/Given 10/20/17 2129)  pantoprazole (PROTONIX) 80 mg in sodium chloride 0.9 % 100 mL IVPB (80 mg Intravenous New Bag/Given 10/20/17 2129)    Mobility walks with person assist

## 2017-10-20 NOTE — H&P (Signed)
History and Physical    Austin Jackson CVE:[id number] DOB: Mar 14, 1920 DOA: 10/20/2017  Referring MD/NP/PA: Margarita Mail, PA  PCP: Neale Burly, MD   Outpatient Specialists: Dr. Tammi Klippel, Urology   Patient coming from: Home  Chief Complaint: Hematuria  HPI: Austin Jackson is a 84 y.o. male with medical history significant of bladder cancer with kidney involvement status post right total nephrectomy last year with left ureteral stent and hydronephrosis chronically who also as history of GI bleed many years ago with the use of Xarelto at the time but has not had any more issues. He has history of atrial fibrillation. Patient lives at home and has a caregiver. Caregiver noted his urine today to have significant hematuria. Also noted black stools. Patient is on iron therapy. She called his son who is an ER physician in [place] who brought patient to the emergency room. In the ER he was found to have guaiac positive stool. No fever or chills no nausea vomiting or diarrhea. Patient has had intermittent left flank pain where he has the stent but that has not changed.   ED Course: patient was evaluated with urinalysis showing red blood cells and white blood cell in his urine but no nitrate or bacteria. He continues to have stable hydronephrosis on the left. Hemoglobin is 8.6 but was 10.6 only 2 weeks ago. He had melena on exam with guaiac positive stool. GI was consulted and we admitting patient for workup.  Review of Systems: As per HPI otherwise 10 point review of systems negative.    Past Medical History:  Diagnosis Date  . Bilateral lower extremity edema   . Bladder cancer ([place])   . CHF (congestive heart failure) (HCC)    mild  . Chronic atrial fibrillation ([place])    dx 2002--  s/p cardioversion approx 2006 or 2007-- per pt last cardiologist visit at that time-- currently be followed by pcp dr Theodis Blaze in [place]  . CKD (chronic kidney disease), stage III ([place])   .  Dysrhythmia    atrial fibrillation ; son [name] states that patient was managed on blood thinner for years for Afbib until cardio decided due to his advanced age to d/c blood dinner , no ASA can be used d/t history of GI bleed so patient is just managed with metoprol. today patient  just reports swelling in bilat lower extremeties for which he was started on lasix and has now seen some improvement   . Full dentures   . GERD (gastroesophageal reflux disease)   . Glaucoma, both eyes   . Gross hematuria    intermittent  . History of BPH   . History of GI bleed    2013--  per pt unable to find reason but had been on blood thinner,  was transfusion's  . History of kidney stones   . Hypertension   . Hypothyroidism due to amiodarone   . Iron deficiency anemia   . Macular degeneration of both eyes   . OA (osteoarthritis)   . PONV (postoperative nausea and vomiting)   . Renal mass, right    renal pelvis    Past Surgical History:  Procedure Laterality Date  . CARDIOVERSION  2002  . COLONOSCOPY  yrs ago  . CYSTOSCOPY W/ URETERAL STENT PLACEMENT Left 08/01/2017   Procedure: CYSTOSCOPY WITH STENT REPLACEMENT;  Surgeon: [name], MD;  Location: [facility];  Service: Urology;  Laterality: Left;  . CYSTOSCOPY WITH RETROGRADE PYELOGRAM, URETEROSCOPY AND STENT PLACEMENT Left 07/19/2016  Procedure: CYSTOSCOPY WITH RETROGRADE PYELOGRAM,  AND STENT PLACEMENT;  Surgeon: [name], MD;  Location: [facility];  Service: Urology;  Laterality: Left;  . CYSTOSCOPY WITH RETROGRADE PYELOGRAM, URETEROSCOPY AND STENT PLACEMENT Left 01/24/2017   Procedure: CYSTOSCOPY WITH RETROGRADE PYELOGRAM, AND STENT REPLACEMENT;  Surgeon: [name], MD;  Location: [facility];  Service: Urology;  Laterality: Left;  . CYSTOSCOPY/RETROGRADE/URETEROSCOPY Bilateral 09/15/2016   Procedure: CYSTOSCOPY/ RETROGRADE/DIAGNOSITC URETEROSCOPY/URETERAL STENT EXCHANGE;  Surgeon: [name], MD;  Location: [facility];  Service: Urology;   Laterality: Bilateral;  . EXTRACORPOREAL SHOCK WAVE LITHOTRIPSY  1970's  . EYE SURGERY Left    for glaucoma  . EYE SURGERY Bilateral    cataract extraction with IOL  . INGUINAL HERNIA REPAIR Bilateral 2007 and 1980's  . ROBOT ASSITED LAPAROSCOPIC NEPHROURETERECTOMY Right 10/12/2016   Procedure: XI ROBOT ASSITED LAPAROSCOPIC NEPHROURETERECTOMY;  Surgeon: [name], MD;  Location: [facility];  Service: Urology;  Laterality: Right;  . TRANSURETHRAL RESECTION OF BLADDER TUMOR Right 07/12/2016   Procedure: TRANSURETHRAL RESECTION OF BLADDER TUMOR (TURBT)/ BILATERAL RETROGRADE PYLEOGRAM/ RIGHT DIAGNOSTIC URETEROSCOPY;  Surgeon: [name], MD;  Location: [facility];  Service: Urology;  Laterality: Right;  . TRANSURETHRAL RESECTION OF PROSTATE  2002     reports that he quit smoking about 33 years ago. His smoking use included cigarettes. He has a 60.00 pack-year smoking history. He has never used smokeless tobacco. He reports that he drinks alcohol. He reports that he does not use drugs.  Allergies  Allergen Reactions  . Codeine Nausea And Vomiting  . Ultram [Tramadol Hcl] Other (See Comments)    Confusion     Family History  Problem Relation Age of Onset  . Stroke Mother   . Heart disease Father   . Heart disease Brother   . Heart disease Paternal Grandfather   . Heart disease Brother   . Kidney cancer Neg Hx   . Diabetes Mellitus II Neg Hx   . CAD Neg Hx     Prior to Admission medications   Medication Sig Start Date End Date Taking? Authorizing Provider  acetaminophen (TYLENOL) 500 MG tablet Take 1,000 mg by mouth every 12 (twelve) hours. 6 am and 6 pm   Yes [provider]  ALPHAGAN P 0.1 % SOLN Place 1 drop into both eyes 3 (three) times daily.  07/01/16  Yes [provider]  Ferrous Sulfate (IRON) 325 (65 Fe) MG TABS Take 325 mg by mouth 2 (two) times daily.    Yes [provider]  furosemide (LASIX) 20 MG tablet Take 20 mg by mouth 3  (three) times daily.    Yes [provider]  latanoprost (XALATAN) 0.005 % ophthalmic solution Place 1 drop into both eyes at bedtime.    Yes [provider]  levothyroxine (SYNTHROID, LEVOTHROID) 50 MCG tablet Take 75 mcg by mouth daily before breakfast.    Yes [provider]  metoprolol tartrate (LOPRESSOR) 25 MG tablet Take 12.5 mg by mouth 2 (two) times daily.    Yes [provider]  tamsulosin (FLOMAX) 0.4 MG CAPS capsule Take 0.4 mg by mouth at bedtime.    Yes [provider]  lisinopril (PRINIVIL,ZESTRIL) 2.5 MG tablet TAKE 1 TABLET (2.5MG  BY MOUTH ONCE DAILY 10/08/17   [provider]    Physical Exam: Vitals:   10/20/17 1644 10/20/17 2135  BP: 117/63 120/67  Pulse: (!) 102 (!) 101  Resp:  16  Temp: 98 F (36.7 C)   TempSrc: Oral  SpO2: 98% 99%      Constitutional: NAD, calm, comfortable Vitals:   10/20/17 1644 10/20/17 2135  BP: 117/63 120/67  Pulse: (!) 102 (!) 101  Resp:  16  Temp: 98 F (36.7 C)   TempSrc: Oral   SpO2: 98% 99%   Eyes: PERRL, lids and conjunctivae normal ENMT: Mucous membranes are moist. Posterior pharynx clear of any exudate or lesions.Normal dentition.  Neck: normal, supple, no masses, no thyromegaly Respiratory: clear to auscultation bilaterally, no wheezing, no crackles. Normal respiratory effort. No accessory muscle use.  Cardiovascular: tachycardic, no murmurs / rubs / gallops. No extremity edema. 2+ pedal pulses. No carotid bruits.  Abdomen: no tenderness, no masses palpated. No hepatosplenomegaly. Bowel sounds positive.  Musculoskeletal: no clubbing / cyanosis. No joint deformity upper and lower extremities. Good ROM, no contractures. Normal muscle tone.  Skin: no rashes, lesions, ulcers. No induration Neurologic: CN 2-12 grossly intact. Sensation intact, DTR normal. Strength 5/5 in all 4.  Psychiatric: Normal judgment and insight. Alert and oriented x 3. Normal mood.   Labs on  Admission: I have personally reviewed following labs and imaging studies  CBC: Recent Labs  Lab 10/20/17 1743  WBC 3.6*  NEUTROABS 1.7  HGB 8.6*  HCT 26.3*  MCV 98.5  PLT 175   Basic Metabolic Panel: Recent Labs  Lab 10/20/17 1743  NA 136  K 4.6  CL 103  CO2 26  GLUCOSE 151*  BUN 48*  CREATININE 1.77*  CALCIUM 8.7*   GFR: CrCl cannot be calculated (Unknown ideal weight.). Liver Function Tests: No results for input(s): AST, ALT, ALKPHOS, BILITOT, PROT, ALBUMIN in the last 168 hours. No results for input(s): LIPASE, AMYLASE in the last 168 hours. No results for input(s): AMMONIA in the last 168 hours. Coagulation Profile: Recent Labs  Lab 10/20/17 2105  INR 1.23   Cardiac Enzymes: No results for input(s): CKTOTAL, CKMB, CKMBINDEX, TROPONINI in the last 168 hours. BNP (last 3 results) No results for input(s): PROBNP in the last 8760 hours. HbA1C: No results for input(s): HGBA1C in the last 72 hours. CBG: No results for input(s): GLUCAP in the last 168 hours. Lipid Profile: No results for input(s): CHOL, HDL, LDLCALC, TRIG, CHOLHDL, LDLDIRECT in the last 72 hours. Thyroid Function Tests: No results for input(s): TSH, T4TOTAL, FREET4, T3FREE, THYROIDAB in the last 72 hours. Anemia Panel: No results for input(s): VITAMINB12, FOLATE, FERRITIN, TIBC, IRON, RETICCTPCT in the last 72 hours. Urine analysis:    Component Value Date/Time   COLORURINE YELLOW 10/20/2017 1743   APPEARANCEUR CLOUDY (A) 10/20/2017 1743   LABSPEC 1.010 10/20/2017 1743   PHURINE 7.0 10/20/2017 1743   GLUCOSEU NEGATIVE 10/20/2017 1743   HGBUR LARGE (A) 10/20/2017 1743   BILIRUBINUR NEGATIVE 10/20/2017 1743   KETONESUR NEGATIVE 10/20/2017 1743   PROTEINUR 30 (A) 10/20/2017 1743   NITRITE NEGATIVE 10/20/2017 1743   LEUKOCYTESUR LARGE (A) 10/20/2017 1743   Sepsis Labs: @LABRCNTIP (procalcitonin:4,lacticidven:4) )No results found for this or any previous visit (from the past 240 hour(s)).     Radiological Exams on Admission: Ct Renal Stone Study  Result Date: 10/20/2017 CLINICAL DATA:  Hematuria, LEFT-sided flank pain, prior RIGHT nephrectomy, history bladder cancer, prior LEFT ureteral stent, CHF, atrial fibrillation, GERD, hypertension, former smoker, stage III chronic kidney disease EXAM: CT ABDOMEN AND PELVIS WITHOUT CONTRAST TECHNIQUE: Multidetector CT imaging of the abdomen and pelvis was performed following the standard protocol without IV contrast. Sagittal and coronal MPR images reconstructed from axial data set. Oral contrast was not  administered. COMPARISON:  07/05/2017 FINDINGS: Lower chest: Increased interstitial changes at the lung bases particularly in periphery of RIGHT lower lobe. Subsegmental atelectasis at lung bases. Enlargement of cardiac chambers diffusely. Hepatobiliary: Contracted gallbladder containing a small calculus. Dilated IVC and hepatic veins question passive congestion of liver. No focal hepatic mass lesion. Pancreas: Atrophic, otherwise unremarkable Spleen: Normal appearance.  Incidental note of a small splenule. Adrenals/Urinary Tract: Prior RIGHT nephrectomy. LEFT adrenal nodule 16 x 12 mm unchanged. No LEFT renal mass. Dilated LEFT renal collecting system and ureter containing a ureteral stent which extends into the urinary bladder. Bladder unremarkable. Enlarged prostate gland with central filling defect question prior TURP. No definite bladder mass visualized. Stomach/Bowel: Descending and sigmoid diverticulosis without evidence of diverticulitis. Stomach and bowel loops otherwise normal appearance Vascular/Lymphatic: Extensive atherosclerotic calcifications of coronary arteries, aorta, iliac arteries, and visceral branch vessels. High-grade narrowing of RIGHT common iliac artery and LEFT external iliac artery. Aorta normal caliber. No adenopathy. Few RIGHT pelvic varices. Reproductive: N/A Other: No free air or free fluid. Ventral hernia containing fat in  epigastrium, defect measuring 12 x 8 mm. No bowel herniation. No acute inflammatory process. Musculoskeletal: Osseous demineralization with degenerative changes and scoliosis of lumbar spine. Spinal stenosis at L2-L3 and L3-L4. IMPRESSION: Cholelithiasis. Slightly increased chronic interstitial lung disease changes at lung bases with mild bibasilar atelectasis. Persistent LEFT hydronephrosis and hydroureter despite ureteral stent. Distal colonic diverticulosis without evidence of diverticulitis. Small ventral hernia in epigastrium containing fat. No acute intra-abdominal or intrapelvic process identified. Extensive atherosclerotic calcifications including coronary arteries and suspected high-grade narrowings of the RIGHT common iliac and LEFT external iliac arteries. Aortic Atherosclerosis (ICD10-I70.0). Electronically Signed   By: Lavonia Dana M.D.   On: 10/20/2017 18:16    EKG: Independently reviewed.  Assessment/Plan Principal Problem:   Melena Active Problems:   Hypothyroidism   Hypertension   Urothelial carcinoma of kidney, right (HCC)   Chronic atrial fibrillation (HCC)   CKD (chronic kidney disease) stage 3, GFR 30-59 ml/min (HCC)     #1 melena: Patient is not on any blood thinners. This could be upper GI or right colon source. We will place patient on telemetry serial CBCs, type and crossmatch for 2 units of packed red blood cells, IV Protonix, follow GI recommendations and input.  #2 chronic atrial fibrillation: Not on anticoagulation. Rate is controlled.  #3 chronic kidney disease stage III; At baseline.  #4 hematuria: Less likely to be UTI. Urology may need to be consulted.   #4 hypertension: Blood pressure appears controlled. Continue home regimen.   DVT prophylaxis: SCD  Code Status: DNR  Family Communication: Son at bedside  Disposition Plan: To be determined  Consults called: [facility] called by ER  Admission status: inpatient  Severity of Illness: The appropriate  patient status for this patient is INPATIENT. Inpatient status is judged to be reasonable and necessary in order to provide the required intensity of service to ensure the patient's safety. The patient's presenting symptoms, physical exam findings, and initial radiographic and laboratory data in the context of their chronic comorbidities is felt to place them at high risk for further clinical deterioration. Furthermore, it is not anticipated that the patient will be medically stable for discharge from the hospital within 2 midnights of admission. The following factors support the patient status of inpatient.   " The patient's presenting symptoms include melena. " The worrisome physical exam findings include chronically ill-looking. " The initial radiographic and laboratory data are worrisome because of Guiac  positive stool. " The chronic co-morbidities include Hx of nephrectomy.   * I certify that at the point of admission it is my clinical judgment that the patient will require inpatient hospital care spanning beyond 2 midnights from the point of admission due to high intensity of service, high risk for further deterioration and high frequency of surveillance required.Barbette Merino MD [facility] Pager [phone]  If 7PM-7AM, please contact night-coverage [email or web address] Password [facility]  10/20/2017, 9:45 PM

## 2017-10-20 NOTE — ED Provider Notes (Signed)
She is seen and evaluated.  Discussed with Margarita Mail PA-C.  Patient has solitary kidney after right nephrectomy and ureterectomy.  Gross hematuria.  Weakness.  Left-sided pain.  Workup shows red blood cells and white blood cells in urine but nitrate and bacteria negative.  Stable hydronephrosis on the left.  Renal function at baseline.  However, hemoglobin 8.6.  Down from greater than 10 just a few weeks ago.  Melena on exam.  Agree with admission for evaluation for GI bleed.   Tanna Furry, MD 10/20/17 2127

## 2017-10-20 NOTE — ED Provider Notes (Signed)
[facility] DEPT Provider Note   CSN: [id number] Arrival date & time: 10/20/17  1636     History   Chief Complaint Chief Complaint  Patient presents with  . Hematuria    HPI Austin Jackson is a 84 y.o. male who presents for Hematuria. He has a pmh of Bladder cancer. He had a R total nephrectomy in Summer 2018 and has a left ureteral stent that is changed every 6 months and is currently 3 months in. He is followed by Dr. [name].  He is chronic left-sided low back pain which comes and goes is worse when he lies down and better when he stands up.  Over the past 3 days that has worsened and is now radiating at times into the left testicle and is sharp and sometimes down the leg.  He denies any new weakness saddle anesthesia.  He has a history of GI bleed and chronic atrial fibrillation but is no longer on Xarelto secondary to the bleeding.  He has had intermittent abdominal pain, worse on the left that is sharp and radiates into his scrotum. He has made frequent stools and has felt weak and more short of breath this week., The patient has a caretaker and is otherwise unable to see secondary to glaucoma.  Yesterday the caretaker noticed some blood in the patient's urine which was minimal.  Today the patient had grossly bloody urine and is complaining of suprapubic pain.  He denies fevers, chills, nausea, vomiting.  Patient's last hemoglobin was 10 several days ago and he has a baseline creatinine of 1.6.    HPI  Past Medical History:  Diagnosis Date  . Bilateral lower extremity edema   . Bladder cancer ([place])   . CHF (congestive heart failure) (HCC)    mild  . Chronic atrial fibrillation ([place])    dx 2002--  s/p cardioversion approx 2006 or 2007-- per pt last cardiologist visit at that time-- currently be followed by pcp dr Theodis Blaze in [place]  . CKD (chronic kidney disease), stage III ([place])   . Dysrhythmia    atrial fibrillation ; son [name] states  that patient was managed on blood thinner for years for Afbib until cardio decided due to his advanced age to d/c blood dinner , no ASA can be used d/t history of GI bleed so patient is just managed with metoprol. today patient  just reports swelling in bilat lower extremeties for which he was started on lasix and has now seen some improvement   . Full dentures   . GERD (gastroesophageal reflux disease)   . Glaucoma, both eyes   . Gross hematuria    intermittent  . History of BPH   . History of GI bleed    2013--  per pt unable to find reason but had been on blood thinner,  was transfusion's  . History of kidney stones   . Hypertension   . Hypothyroidism due to amiodarone   . Iron deficiency anemia   . Macular degeneration of both eyes   . OA (osteoarthritis)   . PONV (postoperative nausea and vomiting)   . Renal mass, right    renal pelvis    Patient Active Problem List   Diagnosis Date Noted  . Urinary tract infection in male 05/08/2017  . Chronic renal disease, stage III ([place]) 05/08/2017  . Glaucoma, both eyes 05/08/2017  . Chronic atrial fibrillation ([place]) 05/08/2017  . Altered mental status 05/08/2017  . Acute metabolic encephalopathy  05/08/2017  . Acute pyelonephritis 05/08/2017  . CKD (chronic kidney disease) stage 3, GFR 30-59 ml/min (HCC) 05/08/2017  . Cellulitis of right leg 05/08/2017  . Urothelial carcinoma of kidney, right ([place]) 10/12/2016  . Acute renal failure (ARF) ([place]) 07/19/2016  . Hydronephrosis 07/19/2016  . Normocytic normochromic anemia 07/19/2016  . Hypothyroidism 07/19/2016  . Hypertension 07/19/2016  . Acute renal failure ([place]) 07/19/2016  . SIRS (systemic inflammatory response syndrome) ([place]) 07/19/2016  . Hyperkalemia 07/05/202018  . Bladder cancer ([place]) 07/12/2016    Past Surgical History:  Procedure Laterality Date  . CARDIOVERSION  2002  . COLONOSCOPY  yrs ago  . CYSTOSCOPY W/ URETERAL STENT PLACEMENT Left 08/01/2017   Procedure: CYSTOSCOPY  WITH STENT REPLACEMENT;  Surgeon: [name], MD;  Location: [facility];  Service: Urology;  Laterality: Left;  . CYSTOSCOPY WITH RETROGRADE PYELOGRAM, URETEROSCOPY AND STENT PLACEMENT Left 07/19/2016   Procedure: CYSTOSCOPY WITH RETROGRADE PYELOGRAM,  AND STENT PLACEMENT;  Surgeon: [name], MD;  Location: [facility];  Service: Urology;  Laterality: Left;  . CYSTOSCOPY WITH RETROGRADE PYELOGRAM, URETEROSCOPY AND STENT PLACEMENT Left 01/24/2017   Procedure: CYSTOSCOPY WITH RETROGRADE PYELOGRAM, AND STENT REPLACEMENT;  Surgeon: [name], MD;  Location: [facility];  Service: Urology;  Laterality: Left;  . CYSTOSCOPY/RETROGRADE/URETEROSCOPY Bilateral 09/15/2016   Procedure: CYSTOSCOPY/ RETROGRADE/DIAGNOSITC URETEROSCOPY/URETERAL STENT EXCHANGE;  Surgeon: [name], MD;  Location: [facility];  Service: Urology;  Laterality: Bilateral;  . EXTRACORPOREAL SHOCK WAVE LITHOTRIPSY  1970's  . EYE SURGERY Left    for glaucoma  . EYE SURGERY Bilateral    cataract extraction with IOL  . INGUINAL HERNIA REPAIR Bilateral 2007 and 1980's  . ROBOT ASSITED LAPAROSCOPIC NEPHROURETERECTOMY Right 10/12/2016   Procedure: XI ROBOT ASSITED LAPAROSCOPIC NEPHROURETERECTOMY;  Surgeon: [name], MD;  Location: [facility];  Service: Urology;  Laterality: Right;  . TRANSURETHRAL RESECTION OF BLADDER TUMOR Right 07/12/2016   Procedure: TRANSURETHRAL RESECTION OF BLADDER TUMOR (TURBT)/ BILATERAL RETROGRADE PYLEOGRAM/ RIGHT DIAGNOSTIC URETEROSCOPY;  Surgeon: [name], MD;  Location: [facility];  Service: Urology;  Laterality: Right;  . TRANSURETHRAL RESECTION OF PROSTATE  2002        Home Medications    Prior to Admission medications   Medication Sig Start Date End Date Taking? Authorizing Provider  acetaminophen (TYLENOL) 500 MG tablet Take 1,000 mg by mouth every 12 (twelve) hours. 6 am and 6 pm    [provider]  ALPHAGAN P 0.1 % SOLN Place 1 drop into both eyes 3 (three) times daily.   07/01/16   [provider]  cephALEXin (KEFLEX) 500 MG capsule Take 500 mg by mouth 2 (two) times daily.    [provider]  Ferrous Sulfate (IRON) 325 (65 Fe) MG TABS Take 325 mg by mouth daily.     [provider]  furosemide (LASIX) 20 MG tablet Take 20 mg by mouth 3 (three) times daily.     [provider]  latanoprost (XALATAN) 0.005 % ophthalmic solution Place 1 drop into both eyes at bedtime.     [provider]  levothyroxine (SYNTHROID, LEVOTHROID) 50 MCG tablet Take 50 mcg by mouth at bedtime.    [provider]  metoprolol tartrate (LOPRESSOR) 25 MG tablet Take 12.5 mg by mouth 3 (three) times daily.     [provider]  tamsulosin (FLOMAX) 0.4 MG CAPS capsule Take 0.4 mg by mouth at bedtime.     [provider]    Family History Family History  Problem Relation  Age of Onset  . Stroke Mother   . Heart disease Father   . Heart disease Brother   . Heart disease Paternal Grandfather   . Heart disease Brother   . Kidney cancer Neg Hx   . Diabetes Mellitus II Neg Hx   . CAD Neg Hx     Social History Social History   Tobacco Use  . Smoking status: Former Smoker    Packs/day: 2.00    Years: 30.00    Pack years: 60.00    Types: Cigarettes    Last attempt to quit: 07/07/1984    Years since quitting: 33.3  . Smokeless tobacco: Never Used  Substance Use Topics  . Alcohol use: Yes    Comment: occasional beer  . Drug use: No     Allergies   Codeine and Ultram [tramadol hcl]   Review of Systems Review of Systems Ten systems reviewed and are negative for acute change, except as noted in the HPI.    Physical Exam Updated Vital Signs BP 117/63   Pulse (!) 102   Temp 98 F (36.7 C) (Oral)   SpO2 98%   Physical Exam  Constitutional: He is oriented to person, place, and time. He appears well-developed. No distress.  Thin elderly male in NAD  HENT:  Head: Normocephalic and atraumatic.  Eyes:  Conjunctivae are normal.  Blind  Dilated Pupils. Left is misshapen BL arcus senilis  Neck: Normal range of motion. Neck supple.  Cardiovascular:  Regularly irregular rhythm  Pulmonary/Chest: Effort normal and breath sounds normal.  Abdominal: Soft. There is tenderness.  Suprapubic  Musculoskeletal:  No midline spinal tenderness. TTP left QL region  Neurological: He is alert and oriented to person, place, and time.  Skin: Skin is warm. He is not diaphoretic.  Nursing note and vitals reviewed.    ED Treatments / Results  Labs (all labs ordered are listed, but only abnormal results are displayed) Labs Reviewed - No data to display  EKG None  Radiology No results found.  Procedures Procedures (including critical care time)  Medications Ordered in ED Medications - No data to display  CRITICAL CARE Performed by: Margarita Mail Total critical care time: 40 minutes Critical care time was exclusive of separately billable procedures and treating other patients. Critical care was necessary to treat or prevent imminent or life-threatening deterioration. Critical care was time spent personally by me on the following activities: development of treatment plan with patient and/or surrogate as well as nursing, discussions with consultants, evaluation of patient's response to treatment, examination of patient, obtaining history from patient or surrogate, ordering and performing treatments and interventions, ordering and review of laboratory studies, ordering and review of radiographic studies, pulse oximetry and re-evaluation of patient's condition.   Initial Impression / Assessment and Plan / ED Course  I have reviewed the triage vital signs and the nursing notes.  Pertinent labs & imaging results that were available during my care of the patient were reviewed by me and considered in my medical decision making (see chart for details).  Clinical Course as of Oct 20 2049  Sat Oct 20, 2017  2014 Patient's HGB was 8.6 2 weeks ago  at his PCP's office. Today it is 8.6 and he has a pos occult stool.  Hemoglobin(!): 8.6 [AH]    Clinical Course User Index [AH] Margarita Mail, PA-C    Patient with hematuria, GI bleed and melanotic stool with about a 2 g drop in his hemoglobin over the past  2 weeks.  Spoke with Dr. Michail Sermon he will consult on the patient with the GI service.  Patient will be admitted by the hospitalist service.  He is started on Protonix drip.  I have ordered type and screen for assumed transfusion.  Final Clinical Impressions(s) / ED Diagnoses   Final diagnoses:  Gastrointestinal hemorrhage, unspecified gastrointestinal hemorrhage type  Melena  Abdominal pain, unspecified abdominal location  Hematuria, unspecified type    ED Discharge Orders    None       Margarita Mail, PA-C 10/21/17 0014    Tanna Furry, MD 10/23/17 1450

## 2017-10-21 ENCOUNTER — Encounter (HOSPITAL_COMMUNITY)
Admission: EM | Disposition: A | Discharge status: Home Health | Payer: Self-pay | Source: Home / Self Care | Attending: Internal Medicine

## 2017-10-21 ENCOUNTER — Inpatient Hospital Stay (HOSPITAL_COMMUNITY): Payer: Medicare Other | Admitting: Anesthesiology

## 2017-10-21 ENCOUNTER — Encounter (HOSPITAL_COMMUNITY): Payer: Self-pay | Admitting: *Deleted

## 2017-10-21 DIAGNOSIS — F05 Delirium due to known physiological condition: Secondary | ICD-10-CM

## 2017-10-21 DIAGNOSIS — R109 Unspecified abdominal pain: Secondary | ICD-10-CM

## 2017-10-21 DIAGNOSIS — N183 Chronic kidney disease, stage 3 (moderate): Secondary | ICD-10-CM

## 2017-10-21 DIAGNOSIS — K921 Melena: Secondary | ICD-10-CM

## 2017-10-21 DIAGNOSIS — R319 Hematuria, unspecified: Secondary | ICD-10-CM

## 2017-10-21 DIAGNOSIS — I482 Chronic atrial fibrillation: Secondary | ICD-10-CM

## 2017-10-21 HISTORY — PX: ESOPHAGOGASTRODUODENOSCOPY (EGD) WITH PROPOFOL: SHX5813

## 2017-10-21 LAB — CBC
HCT: 34.4 % — ABNORMAL LOW (ref 39.0–52.0)
HEMOGLOBIN: 11.2 g/dL — AB (ref 13.0–17.0)
MCH: 30.7 pg (ref 26.0–34.0)
MCHC: 32.6 g/dL (ref 30.0–36.0)
MCV: 94.2 fL (ref 78.0–100.0)
PLATELETS: 157 10*3/uL (ref 150–400)
RBC: 3.65 MIL/uL — AB (ref 4.22–5.81)
RDW: 18.6 % — ABNORMAL HIGH (ref 11.5–15.5)
WBC: 5.6 10*3/uL (ref 4.0–10.5)

## 2017-10-21 LAB — COMPREHENSIVE METABOLIC PANEL
ALK PHOS: 61 U/L (ref 38–126)
ALT: 8 U/L — ABNORMAL LOW (ref 17–63)
AST: 18 U/L (ref 15–41)
Albumin: 2.5 g/dL — ABNORMAL LOW (ref 3.5–5.0)
Anion gap: 8 (ref 5–15)
BUN: 40 mg/dL — ABNORMAL HIGH (ref 6–20)
CALCIUM: 8.2 mg/dL — AB (ref 8.9–10.3)
CO2: 21 mmol/L — ABNORMAL LOW (ref 22–32)
Chloride: 105 mmol/L (ref 101–111)
Creatinine, Ser: 1.63 mg/dL — ABNORMAL HIGH (ref 0.61–1.24)
GFR, EST AFRICAN AMERICAN: 40 mL/min — AB (ref >60)
GFR, EST NON AFRICAN AMERICAN: 34 mL/min — AB (ref >60)
Glucose, Bld: 189 mg/dL — ABNORMAL HIGH (ref 65–99)
Potassium: 4.9 mmol/L (ref 3.5–5.1)
Sodium: 134 mmol/L — ABNORMAL LOW (ref 135–145)
TOTAL PROTEIN: 7.3 g/dL (ref 6.5–8.1)
Total Bilirubin: 1.1 mg/dL (ref 0.3–1.2)

## 2017-10-21 LAB — PREPARE RBC (CROSSMATCH)

## 2017-10-21 SURGERY — ESOPHAGOGASTRODUODENOSCOPY (EGD) WITH PROPOFOL
Anesthesia: Monitor Anesthesia Care

## 2017-10-21 MED ORDER — PHENYLEPHRINE HCL 10 MG/ML IJ SOLN
INTRAMUSCULAR | Status: DC | PRN
  Administered 2017-10-21: 40 ug via INTRAVENOUS

## 2017-10-21 MED ORDER — PROPOFOL 10 MG/ML IV BOLUS
INTRAVENOUS | Status: AC
  Filled 2017-10-21: qty 40

## 2017-10-21 MED ORDER — PROPOFOL 10 MG/ML IV BOLUS
INTRAVENOUS | Status: DC | PRN
  Administered 2017-10-21: 20 mg via INTRAVENOUS

## 2017-10-21 MED ORDER — HALOPERIDOL LACTATE 5 MG/ML IJ SOLN: 1.0000 mg | Freq: Four times a day (QID) | INTRAMUSCULAR | Status: DC | PRN

## 2017-10-21 MED ORDER — QUETIAPINE FUMARATE 25 MG PO TABS: 25.0000 mg | ORAL_TABLET | Freq: Every evening | ORAL | Status: DC | PRN

## 2017-10-21 MED ORDER — PROPOFOL 500 MG/50ML IV EMUL
INTRAVENOUS | Status: DC | PRN
  Administered 2017-10-21: 25 ug/kg/min via INTRAVENOUS

## 2017-10-21 MED ORDER — PHENYLEPHRINE 40 MCG/ML (10ML) SYRINGE FOR IV PUSH (FOR BLOOD PRESSURE SUPPORT)
PREFILLED_SYRINGE | INTRAVENOUS | Status: DC | PRN
  Administered 2017-10-21: 80 ug via INTRAVENOUS
  Administered 2017-10-21: 40 ug via INTRAVENOUS
  Administered 2017-10-21: 80 ug via INTRAVENOUS

## 2017-10-21 SURGICAL SUPPLY — 15 items

## 2017-10-21 NOTE — Brief Op Note (Signed)
Gastric ulcer with bleeding stigmata without active bleeding. Gastritis. Nonbleeding duodenal AVMs noted. Mild candida esophagitis. See endopro note for details.  Continue Protonix drip today. Clear liquids ok. When tolerating solid diet then give Nystatin swish and swallow X 5 days. [facility] will f/u tomorrow.

## 2017-10-21 NOTE — Anesthesia Postprocedure Evaluation (Signed)
Anesthesia Post Note  Patient: Austin Jackson  Procedure(s) Performed: ESOPHAGOGASTRODUODENOSCOPY (EGD) WITH PROPOFOL (N/A )     Patient location during evaluation: Endoscopy Anesthesia Type: MAC Level of consciousness: awake Pain management: pain level controlled Vital Signs Assessment: post-procedure vital signs reviewed and stable Respiratory status: spontaneous breathing Cardiovascular status: stable Anesthetic complications: no    Last Vitals:  Vitals:   10/21/17 1421 10/21/17 1452  BP: (!) 93/56 127/84  Pulse: 61 89  Resp: (!) 21 20  Temp:  (!) 36.4 C  SpO2: 99% 100%    Last Pain:  Vitals:   10/21/17 1452  TempSrc: Oral  PainSc:                  [name]

## 2017-10-21 NOTE — Consult Note (Signed)
Referring Provider: Dr. Jeneen Rinks Primary Care Physician:  Neale Burly, MD Primary Gastroenterologist:  Althia Forts  Reason for Consultation:  Melena  HPI: Austin Jackson is a 84 y.o. male with history of bladder cancer s/p right nephrectomy, CBD, Afib who was on Xarelto in the past but it was stopped about 5 years ago due to a GI bleed. He reportedly had an EGD/colon 5 years ago (records unknown) per his son and no source was found. He has been having black stools for the past week per the son from the caregiver. Yesterday had bright red blood per rectum. Has been very weak. Has been having left flank pain that family thought was due to his left ureteral stent. Has been having abdominal pain as well but unable to give me details on that but mainly points to his left flank. Occasional heartburn and dysphagia over the years. Hgb 8.6 (reportedly 10.6 2 weeks ago). Heme positive. Denies NSAIDs. On iron pills.  Past Medical History:  Diagnosis Date  . Bilateral lower extremity edema   . Bladder cancer ([place])   . CHF (congestive heart failure) (HCC)    mild  . Chronic atrial fibrillation ([place])    dx 2002--  s/p cardioversion approx 2006 or 2007-- per pt last cardiologist visit at that time-- currently be followed by pcp dr Theodis Blaze in [place]  . CKD (chronic kidney disease), stage III ([place])   . Dysrhythmia    atrial fibrillation ; son [name] states that patient was managed on blood thinner for years for Afbib until cardio decided due to his advanced age to d/c blood dinner , no ASA can be used d/t history of GI bleed so patient is just managed with metoprol. today patient  just reports swelling in bilat lower extremeties for which he was started on lasix and has now seen some improvement   . Full dentures   . GERD (gastroesophageal reflux disease)   . Glaucoma, both eyes   . Gross hematuria    intermittent  . History of BPH   . History of GI bleed    2013--  per pt unable to find reason but  had been on blood thinner,  was transfusion's  . History of kidney stones   . Hypertension   . Hypothyroidism due to amiodarone   . Iron deficiency anemia   . Macular degeneration of both eyes   . OA (osteoarthritis)   . PONV (postoperative nausea and vomiting)   . Renal mass, right    renal pelvis    Past Surgical History:  Procedure Laterality Date  . CARDIOVERSION  2002  . COLONOSCOPY  yrs ago  . CYSTOSCOPY W/ URETERAL STENT PLACEMENT Left 08/01/2017   Procedure: CYSTOSCOPY WITH STENT REPLACEMENT;  Surgeon: [name], MD;  [facility];  Service: Urology;  Laterality: Left;  . CYSTOSCOPY WITH RETROGRADE PYELOGRAM, URETEROSCOPY AND STENT PLACEMENT Left 07/19/2016   Procedure: CYSTOSCOPY WITH RETROGRADE PYELOGRAM,  AND STENT PLACEMENT;  Surgeon: [name], MD;  Location: [facility];  Service: Urology;  Laterality: Left;  . CYSTOSCOPY WITH RETROGRADE PYELOGRAM, URETEROSCOPY AND STENT PLACEMENT Left 01/24/2017   Procedure: CYSTOSCOPY WITH RETROGRADE PYELOGRAM, AND STENT REPLACEMENT;  Surgeon: [name], MD;  Location[facility];  Service: Urology;  Laterality: Left;  . CYSTOSCOPY/RETROGRADE/URETEROSCOPY Bilateral 09/15/2016   Procedure: CYSTOSCOPY/ RETROGRADE/DIAGNOSITC URETEROSCOPY/URETERAL STENT EXCHANGE;  Surgeon: [name], MD;  Location: [facility];  Service: Urology;  Laterality: Bilateral;  . EXTRACORPOREAL SHOCK WAVE LITHOTRIPSY  1970's  . EYE  SURGERY Left    for glaucoma  . EYE SURGERY Bilateral    cataract extraction with IOL  . INGUINAL HERNIA REPAIR Bilateral 2007 and 1980's  . ROBOT ASSITED LAPAROSCOPIC NEPHROURETERECTOMY Right 10/12/2016   Procedure: XI ROBOT ASSITED LAPAROSCOPIC NEPHROURETERECTOMY;  Surgeon: [name], MD;  [facility]: [facility];  Service: Urology;  Laterality: Right;  . TRANSURETHRAL RESECTION OF BLADDER TUMOR Right 07/12/2016   Procedure: TRANSURETHRAL RESECTION OF BLADDER TUMOR (TURBT)/ BILATERAL RETROGRADE PYLEOGRAM/ RIGHT DIAGNOSTIC  URETEROSCOPY;  Surgeon: [name], MD;  Location[facility];  Service: Urology;  Laterality: Right;  . TRANSURETHRAL RESECTION OF PROSTATE  2002    Prior to Admission medications   Medication Sig Start Date End Date Taking? Authorizing Provider  acetaminophen (TYLENOL) 500 MG tablet Take 1,000 mg by mouth every 12 (twelve) hours. 6 am and 6 pm   Yes [provider]  ALPHAGAN P 0.1 % SOLN Place 1 drop into both eyes 3 (three) times daily.  07/01/16  Yes [provider]  Ferrous Sulfate (IRON) 325 (65 Fe) MG TABS Take 325 mg by mouth 2 (two) times daily.    Yes [provider]  furosemide (LASIX) 20 MG tablet Take 20 mg by mouth 3 (three) times daily.    Yes [provider]  latanoprost (XALATAN) 0.005 % ophthalmic solution Place 1 drop into both eyes at bedtime.    Yes [provider]  levothyroxine (SYNTHROID, LEVOTHROID) 50 MCG tablet Take 75 mcg by mouth daily before breakfast.    Yes [provider]  lisinopril (PRINIVIL,ZESTRIL) 2.5 MG tablet TAKE 1 TABLET (2.5MG  BY MOUTH ONCE DAILY 10/08/17  Yes [provider]  metoprolol tartrate (LOPRESSOR) 25 MG tablet Take 12.5 mg by mouth 3 (three) times daily.    Yes [provider]  tamsulosin (FLOMAX) 0.4 MG CAPS capsule Take 0.4 mg by mouth at bedtime.    Yes [provider]    Scheduled Meds: . acetaminophen  1,000 mg Oral Q12H  . brimonidine  1 drop Both Eyes TID  . latanoprost  1 drop Both Eyes QHS  . levothyroxine  75 mcg Oral QAC breakfast  . metoprolol tartrate  12.5 mg Oral BID  . tamsulosin  0.4 mg Oral QHS   Continuous Infusions: . sodium chloride 50 mL/hr at 10/21/17 0021  . pantoprozole (PROTONIX) infusion 8 mg/hr (10/21/17 0729)   PRN Meds:.haloperidol lactate, ondansetron **OR** ondansetron (ZOFRAN) IV, QUEtiapine  Allergies as of 10/20/2017 - Review Complete 10/20/2017  Allergen Reaction Noted  . Codeine Nausea And  Vomiting 07/12/2016  . Ultram [tramadol hcl] Other (See Comments) 07/13/2016    Family History  Problem Relation Age of Onset  . Stroke Mother   . Heart disease Father   . Heart disease Brother   . Heart disease Paternal Grandfather   . Heart disease Brother   . Kidney cancer Neg Hx   . Diabetes Mellitus II Neg Hx   . CAD Neg Hx     Social History   Socioeconomic History  . Marital status: Married    Spouse name: Not on file  . Number of children: Not on file  . Years of education: Not on file  . Highest education level: Not on file  Occupational History  . Not on file  Social Needs  . Financial resource strain: Not on file  . Food insecurity:    Worry: Not on file    Inability: Not on file  . Transportation needs:  Medical: Not on file    Non-medical: Not on file  Tobacco Use  . Smoking status: Former Smoker    Packs/day: 2.00    Years: 30.00    Pack years: 60.00    Types: Cigarettes    Last attempt to quit: 07/07/1984    Years since quitting: 33.3  . Smokeless tobacco: Never Used  Substance and Sexual Activity  . Alcohol use: Yes    Comment: occasional beer  . Drug use: No  . Sexual activity: Not on file  Lifestyle  . Physical activity:    Days per week: Not on file    Minutes per session: Not on file  . Stress: Not on file  Relationships  . Social connections:    Talks on phone: Not on file    Gets together: Not on file    Attends religious service: Not on file    Active member of club or organization: Not on file    Attends meetings of clubs or organizations: Not on file    Relationship status: Not on file  . Intimate partner violence:    Fear of current or ex partner: Not on file    Emotionally abused: Not on file    Physically abused: Not on file    Forced sexual activity: Not on file  Other Topics Concern  . Not on file  Social History Narrative  . Not on file    Review of Systems: All negative except as stated above in HPI.  Physical  Exam: Vital signs: Vitals:   10/21/17 0923 10/21/17 0936  BP: (!) 111/52 120/77  Pulse:  96  Resp:  18  Temp:  97.6 F (36.4 C)  SpO2:  100%   Last BM Date: 10/21/17 General:   Lethargic, elderly, thin, dry skin, no acute distress Head: normocephalic, atraumatic Eyes: anicteric sclera ENT: oropharynx clear Neck: supple, nontender Lungs:  Clear throughout to auscultation.   No wheezes, crackles, or rhonchi. No acute distress. Heart:  Regular rate and rhythm; no murmurs, clicks, rubs,  or gallops. Abdomen: diffuse tenderness with guarding, soft, nondistended, +BS  Rectal:  Deferred Ext: no edema  GI:  Lab Results: Recent Labs    10/20/17 1743 10/20/17 2336  WBC 3.6* 4.9  HGB 8.6* 9.0*  HCT 26.3* 27.6*  PLT 172 190   BMET Recent Labs    10/20/17 1743  NA 136  K 4.6  CL 103  CO2 26  GLUCOSE 151*  BUN 48*  CREATININE 1.77*  CALCIUM 8.7*   LFT No results for input(s): PROT, ALBUMIN, AST, ALT, ALKPHOS, BILITOT, BILIDIR, IBILI in the last 72 hours. PT/INR Recent Labs    10/20/17 2105  LABPROT 15.4*  INR 1.23     Studies/Results: Ct Renal Stone Study  Result Date: 10/20/2017 CLINICAL DATA:  Hematuria, LEFT-sided flank pain, prior RIGHT nephrectomy, history bladder cancer, prior LEFT ureteral stent, CHF, atrial fibrillation, GERD, hypertension, former smoker, stage III chronic kidney disease EXAM: CT ABDOMEN AND PELVIS WITHOUT CONTRAST TECHNIQUE: Multidetector CT imaging of the abdomen and pelvis was performed following the standard protocol without IV contrast. Sagittal and coronal MPR images reconstructed from axial data set. Oral contrast was not administered. COMPARISON:  07/05/2017 FINDINGS: Lower chest: Increased interstitial changes at the lung bases particularly in periphery of RIGHT lower lobe. Subsegmental atelectasis at lung bases. Enlargement of cardiac chambers diffusely. Hepatobiliary: Contracted gallbladder containing a small calculus. Dilated IVC and  hepatic veins question passive congestion of liver. No focal hepatic mass  lesion. Pancreas: Atrophic, otherwise unremarkable Spleen: Normal appearance.  Incidental note of a small splenule. Adrenals/Urinary Tract: Prior RIGHT nephrectomy. LEFT adrenal nodule 16 x 12 mm unchanged. No LEFT renal mass. Dilated LEFT renal collecting system and ureter containing a ureteral stent which extends into the urinary bladder. Bladder unremarkable. Enlarged prostate gland with central filling defect question prior TURP. No definite bladder mass visualized. Stomach/Bowel: Descending and sigmoid diverticulosis without evidence of diverticulitis. Stomach and bowel loops otherwise normal appearance Vascular/Lymphatic: Extensive atherosclerotic calcifications of coronary arteries, aorta, iliac arteries, and visceral branch vessels. High-grade narrowing of RIGHT common iliac artery and LEFT external iliac artery. Aorta normal caliber. No adenopathy. Few RIGHT pelvic varices. Reproductive: N/A Other: No free air or free fluid. Ventral hernia containing fat in epigastrium, defect measuring 12 x 8 mm. No bowel herniation. No acute inflammatory process. Musculoskeletal: Osseous demineralization with degenerative changes and scoliosis of lumbar spine. Spinal stenosis at L2-L3 and L3-L4. IMPRESSION: Cholelithiasis. Slightly increased chronic interstitial lung disease changes at lung bases with mild bibasilar atelectasis. Persistent LEFT hydronephrosis and hydroureter despite ureteral stent. Distal colonic diverticulosis without evidence of diverticulitis. Small ventral hernia in epigastrium containing fat. No acute intra-abdominal or intrapelvic process identified. Extensive atherosclerotic calcifications including coronary arteries and suspected high-grade narrowings of the RIGHT common iliac and LEFT external iliac arteries. Aortic Atherosclerosis (ICD10-I70.0). Electronically Signed   By: Lavonia Dana M.D.   On: 10/20/2017 18:16     Impression/Plan: GI bleed with melena and worsened anemia. Despite being on iron pills which can cause black stools I am concerned the black stools are due to an upper GI bleed. Doubt right-sided colonic source but still possible. EGD today. Risks/benefits discussed with patient and his son and they agree to proceed. Continue Protonix drip. NPO. If EGD is unrevealing, then will manage conservatively unless ongoing bleeding or worsening anemia and then may need to consider doing a colonoscopy.     LOS: 1 day   [place] C.  10/21/2017, 11:08 AM  Pager [phone]  AFTER 5 pm or on weekends please call [phone]

## 2017-10-21 NOTE — Interval H&P Note (Signed)
History and Physical Interval Note:  10/21/2017 1:32 PM  Austin Jackson  has presented today for surgery, with the diagnosis of melena  The various methods of treatment have been discussed with the patient and family. After consideration of risks, benefits and other options for treatment, the patient has consented to  Procedure(s): ESOPHAGOGASTRODUODENOSCOPY (EGD) WITH PROPOFOL (N/A) as a surgical intervention .  The patient's history has been reviewed, patient examined, no change in status, stable for surgery.  I have reviewed the patient's chart and labs.  Questions were answered to the patient's satisfaction.     [place] C.

## 2017-10-21 NOTE — Plan of Care (Signed)
  Problem: Education: Goal: Knowledge of General Education information will improve Outcome: Progressing   Problem: Health Behavior/Discharge Planning: Goal: Ability to manage health-related needs will improve Outcome: Progressing   Problem: Clinical Measurements: Goal: Ability to maintain clinical measurements within normal limits will improve Outcome: Progressing Goal: Diagnostic test results will improve Outcome: Progressing   Problem: Activity: Goal: Risk for activity intolerance will decrease Outcome: Progressing   Problem: Nutrition: Goal: Adequate nutrition will be maintained Outcome: Progressing   Problem: Clinical Measurements: Goal: Will remain free from infection Outcome: Adequate for Discharge Goal: Respiratory complications will improve Outcome: Adequate for Discharge Goal: Cardiovascular complication will be avoided Outcome: Adequate for Discharge   Problem: Coping: Goal: Level of anxiety will decrease Outcome: Adequate for Discharge   Problem: Elimination: Goal: Will not experience complications related to bowel motility Outcome: Adequate for Discharge Goal: Will not experience complications related to urinary retention Outcome: Adequate for Discharge   Problem: Pain Managment: Goal: General experience of comfort will improve Outcome: Adequate for Discharge   Problem: Safety: Goal: Ability to remain free from injury will improve Outcome: Adequate for Discharge   Problem: Skin Integrity: Goal: Risk for impaired skin integrity will decrease Outcome: Adequate for Discharge

## 2017-10-21 NOTE — H&P (View-Only) (Signed)
Referring Provider: Dr. Jeneen Rinks Primary Care Physician:  Neale Burly, MD Primary Gastroenterologist:  Althia Forts  Reason for Consultation:  Melena  HPI: Austin Jackson is a 84 y.o. male with history of bladder cancer s/p right nephrectomy, CBD, Afib who was on Xarelto in the past but it was stopped about 5 years ago due to a GI bleed. He reportedly had an EGD/colon 5 years ago (records unknown) per his son and no source was found. He has been having black stools for the past week per the son from the caregiver. Yesterday had bright red blood per rectum. Has been very weak. Has been having left flank pain that family thought was due to his left ureteral stent. Has been having abdominal pain as well but unable to give me details on that but mainly points to his left flank. Occasional heartburn and dysphagia over the years. Hgb 8.6 (reportedly 10.6 2 weeks ago). Heme positive. Denies NSAIDs. On iron pills.  Past Medical History:  Diagnosis Date  . Bilateral lower extremity edema   . Bladder cancer ([place])   . CHF (congestive heart failure) ([facility])    mild  . Chronic atrial fibrillation ([place])    dx 2002--  s/p cardioversion approx 2006 or 2007-- per pt last cardiologist visit at that time-- currently be followed by pcp dr Theodis Blaze in [place]  . CKD (chronic kidney disease), stage III ([place])   . Dysrhythmia    atrial fibrillation ; son [name] states that patient was managed on blood thinner for years for Afbib until cardio decided due to his advanced age to d/c blood dinner , no ASA can be used d/t history of GI bleed so patient is just managed with metoprol. today patient  just reports swelling in bilat lower extremeties for which he was started on lasix and has now seen some improvement   . Full dentures   . GERD (gastroesophageal reflux disease)   . Glaucoma, both eyes   . Gross hematuria    intermittent  . History of BPH   . History of GI bleed    2013--  per pt unable to find reason but  had been on blood thinner,  was transfusion's  . History of kidney stones   . Hypertension   . Hypothyroidism due to amiodarone   . Iron deficiency anemia   . Macular degeneration of both eyes   . OA (osteoarthritis)   . PONV (postoperative nausea and vomiting)   . Renal mass, right    renal pelvis    Past Surgical History:  Procedure Laterality Date  . CARDIOVERSION  2002  . COLONOSCOPY  yrs ago  . CYSTOSCOPY W/ URETERAL STENT PLACEMENT Left 08/01/2017   Procedure: CYSTOSCOPY WITH STENT REPLACEMENT;  Surgeon: [name], MD;  Location: [facility];  Service: Urology;  Laterality: Left;  . CYSTOSCOPY WITH RETROGRADE PYELOGRAM, URETEROSCOPY AND STENT PLACEMENT Left 07/19/2016   Procedure: CYSTOSCOPY WITH RETROGRADE PYELOGRAM,  AND STENT PLACEMENT;  Surgeon: [name], MD;  Location: [facility];  Service: Urology;  Laterality: Left;  . CYSTOSCOPY WITH RETROGRADE PYELOGRAM, URETEROSCOPY AND STENT PLACEMENT Left 01/24/2017   Procedure: CYSTOSCOPY WITH RETROGRADE PYELOGRAM, AND STENT REPLACEMENT;  Surgeon: [name], MD;  Location: [facility];  Service: Urology;  Laterality: Left;  . CYSTOSCOPY/RETROGRADE/URETEROSCOPY Bilateral 09/15/2016   Procedure: CYSTOSCOPY/ RETROGRADE/DIAGNOSITC URETEROSCOPY/URETERAL STENT EXCHANGE;  Surgeon: [name], MD;  Location: [facility];  Service: Urology;  Laterality: Bilateral;  . EXTRACORPOREAL SHOCK WAVE LITHOTRIPSY  1970's  . EYE  SURGERY Left    for glaucoma  . EYE SURGERY Bilateral    cataract extraction with IOL  . INGUINAL HERNIA REPAIR Bilateral 2007 and 1980's  . ROBOT ASSITED LAPAROSCOPIC NEPHROURETERECTOMY Right 10/12/2016   Procedure: XI ROBOT ASSITED LAPAROSCOPIC NEPHROURETERECTOMY;  Surgeon: [name], MD;  Location: [facility];  Service: Urology;  Laterality: Right;  . TRANSURETHRAL RESECTION OF BLADDER TUMOR Right 07/12/2016   Procedure: TRANSURETHRAL RESECTION OF BLADDER TUMOR (TURBT)/ BILATERAL RETROGRADE PYLEOGRAM/ RIGHT DIAGNOSTIC  URETEROSCOPY;  Surgeon: [name], MD;  Location: [facility];  Service: Urology;  Laterality: Right;  . TRANSURETHRAL RESECTION OF PROSTATE  2002    Prior to Admission medications   Medication Sig Start Date End Date Taking? Authorizing Provider  acetaminophen (TYLENOL) 500 MG tablet Take 1,000 mg by mouth every 12 (twelve) hours. 6 am and 6 pm   Yes [provider]  ALPHAGAN P 0.1 % SOLN Place 1 drop into both eyes 3 (three) times daily.  07/01/16  Yes [provider]  Ferrous Sulfate (IRON) 325 (65 Fe) MG TABS Take 325 mg by mouth 2 (two) times daily.    Yes [provider]  furosemide (LASIX) 20 MG tablet Take 20 mg by mouth 3 (three) times daily.    Yes [provider]  latanoprost (XALATAN) 0.005 % ophthalmic solution Place 1 drop into both eyes at bedtime.    Yes [provider]  levothyroxine (SYNTHROID, LEVOTHROID) 50 MCG tablet Take 75 mcg by mouth daily before breakfast.    Yes [provider]  lisinopril (PRINIVIL,ZESTRIL) 2.5 MG tablet TAKE 1 TABLET (2.5MG  BY MOUTH ONCE DAILY 10/08/17  Yes [provider]  metoprolol tartrate (LOPRESSOR) 25 MG tablet Take 12.5 mg by mouth 3 (three) times daily.    Yes [provider]  tamsulosin (FLOMAX) 0.4 MG CAPS capsule Take 0.4 mg by mouth at bedtime.    Yes [provider]    Scheduled Meds: . acetaminophen  1,000 mg Oral Q12H  . brimonidine  1 drop Both Eyes TID  . latanoprost  1 drop Both Eyes QHS  . levothyroxine  75 mcg Oral QAC breakfast  . metoprolol tartrate  12.5 mg Oral BID  . tamsulosin  0.4 mg Oral QHS   Continuous Infusions: . sodium chloride 50 mL/hr at 10/21/17 0021  . pantoprozole (PROTONIX) infusion 8 mg/hr (10/21/17 0729)   PRN Meds:.haloperidol lactate, ondansetron **OR** ondansetron (ZOFRAN) IV, QUEtiapine  Allergies as of 10/20/2017 - Review Complete 10/20/2017  Allergen Reaction Noted  . Codeine Nausea And  Vomiting 07/12/2016  . Ultram [tramadol hcl] Other (See Comments) 07/13/2016    Family History  Problem Relation Age of Onset  . Stroke Mother   . Heart disease Father   . Heart disease Brother   . Heart disease Paternal Grandfather   . Heart disease Brother   . Kidney cancer Neg Hx   . Diabetes Mellitus II Neg Hx   . CAD Neg Hx     Social History   Socioeconomic History  . Marital status: Married    Spouse name: Not on file  . Number of children: Not on file  . Years of education: Not on file  . Highest education level: Not on file  Occupational History  . Not on file  Social Needs  . Financial resource strain: Not on file  . Food insecurity:    Worry: Not on file    Inability: Not on file  . Transportation needs:  Medical: Not on file    Non-medical: Not on file  Tobacco Use  . Smoking status: Former Smoker    Packs/day: 2.00    Years: 30.00    Pack years: 60.00    Types: Cigarettes    Last attempt to quit: 07/07/1984    Years since quitting: 33.3  . Smokeless tobacco: Never Used  Substance and Sexual Activity  . Alcohol use: Yes    Comment: occasional beer  . Drug use: No  . Sexual activity: Not on file  Lifestyle  . Physical activity:    Days per week: Not on file    Minutes per session: Not on file  . Stress: Not on file  Relationships  . Social connections:    Talks on phone: Not on file    Gets together: Not on file    Attends religious service: Not on file    Active member of club or organization: Not on file    Attends meetings of clubs or organizations: Not on file    Relationship status: Not on file  . Intimate partner violence:    Fear of current or ex partner: Not on file    Emotionally abused: Not on file    Physically abused: Not on file    Forced sexual activity: Not on file  Other Topics Concern  . Not on file  Social History Narrative  . Not on file    Review of Systems: All negative except as stated above in HPI.  Physical  Exam: Vital signs: Vitals:   10/21/17 0923 10/21/17 0936  BP: (!) 111/52 120/77  Pulse:  96  Resp:  18  Temp:  97.6 F (36.4 C)  SpO2:  100%   Last BM Date: 10/21/17 General:   Lethargic, elderly, thin, dry skin, no acute distress Head: normocephalic, atraumatic Eyes: anicteric sclera ENT: oropharynx clear Neck: supple, nontender Lungs:  Clear throughout to auscultation.   No wheezes, crackles, or rhonchi. No acute distress. Heart:  Regular rate and rhythm; no murmurs, clicks, rubs,  or gallops. Abdomen: diffuse tenderness with guarding, soft, nondistended, +BS  Rectal:  Deferred Ext: no edema  GI:  Lab Results: Recent Labs    10/20/17 1743 10/20/17 2336  WBC 3.6* 4.9  HGB 8.6* 9.0*  HCT 26.3* 27.6*  PLT 172 190   BMET Recent Labs    10/20/17 1743  NA 136  K 4.6  CL 103  CO2 26  GLUCOSE 151*  BUN 48*  CREATININE 1.77*  CALCIUM 8.7*   LFT No results for input(s): PROT, ALBUMIN, AST, ALT, ALKPHOS, BILITOT, BILIDIR, IBILI in the last 72 hours. PT/INR Recent Labs    10/20/17 2105  LABPROT 15.4*  INR 1.23     Studies/Results: Ct Renal Stone Study  Result Date: 10/20/2017 CLINICAL DATA:  Hematuria, LEFT-sided flank pain, prior RIGHT nephrectomy, history bladder cancer, prior LEFT ureteral stent, CHF, atrial fibrillation, GERD, hypertension, former smoker, stage III chronic kidney disease EXAM: CT ABDOMEN AND PELVIS WITHOUT CONTRAST TECHNIQUE: Multidetector CT imaging of the abdomen and pelvis was performed following the standard protocol without IV contrast. Sagittal and coronal MPR images reconstructed from axial data set. Oral contrast was not administered. COMPARISON:  07/05/2017 FINDINGS: Lower chest: Increased interstitial changes at the lung bases particularly in periphery of RIGHT lower lobe. Subsegmental atelectasis at lung bases. Enlargement of cardiac chambers diffusely. Hepatobiliary: Contracted gallbladder containing a small calculus. Dilated IVC and  hepatic veins question passive congestion of liver. No focal hepatic mass  lesion. Pancreas: Atrophic, otherwise unremarkable Spleen: Normal appearance.  Incidental note of a small splenule. Adrenals/Urinary Tract: Prior RIGHT nephrectomy. LEFT adrenal nodule 16 x 12 mm unchanged. No LEFT renal mass. Dilated LEFT renal collecting system and ureter containing a ureteral stent which extends into the urinary bladder. Bladder unremarkable. Enlarged prostate gland with central filling defect question prior TURP. No definite bladder mass visualized. Stomach/Bowel: Descending and sigmoid diverticulosis without evidence of diverticulitis. Stomach and bowel loops otherwise normal appearance Vascular/Lymphatic: Extensive atherosclerotic calcifications of coronary arteries, aorta, iliac arteries, and visceral branch vessels. High-grade narrowing of RIGHT common iliac artery and LEFT external iliac artery. Aorta normal caliber. No adenopathy. Few RIGHT pelvic varices. Reproductive: N/A Other: No free air or free fluid. Ventral hernia containing fat in epigastrium, defect measuring 12 x 8 mm. No bowel herniation. No acute inflammatory process. Musculoskeletal: Osseous demineralization with degenerative changes and scoliosis of lumbar spine. Spinal stenosis at L2-L3 and L3-L4. IMPRESSION: Cholelithiasis. Slightly increased chronic interstitial lung disease changes at lung bases with mild bibasilar atelectasis. Persistent LEFT hydronephrosis and hydroureter despite ureteral stent. Distal colonic diverticulosis without evidence of diverticulitis. Small ventral hernia in epigastrium containing fat. No acute intra-abdominal or intrapelvic process identified. Extensive atherosclerotic calcifications including coronary arteries and suspected high-grade narrowings of the RIGHT common iliac and LEFT external iliac arteries. Aortic Atherosclerosis (ICD10-I70.0). Electronically Signed   By: Lavonia Dana M.D.   On: 10/20/2017 18:16     Impression/Plan: GI bleed with melena and worsened anemia. Despite being on iron pills which can cause black stools I am concerned the black stools are due to an upper GI bleed. Doubt right-sided colonic source but still possible. EGD today. Risks/benefits discussed with patient and his son and they agree to proceed. Continue Protonix drip. NPO. If EGD is unrevealing, then will manage conservatively unless ongoing bleeding or worsening anemia and then may need to consider doing a colonoscopy.     LOS: 1 day   [place] C.  10/21/2017, 11:08 AM  Pager [phone]  AFTER 5 pm or on weekends please call [phone]

## 2017-10-21 NOTE — Plan of Care (Signed)

## 2017-10-21 NOTE — Transfer of Care (Signed)
Immediate Anesthesia Transfer of Care Note  Patient: DRADEN COTTINGHAM  Procedure(s) Performed: Procedure(s): ESOPHAGOGASTRODUODENOSCOPY (EGD) WITH PROPOFOL (N/A)  Patient Location: PACU  Anesthesia Type:MAC  Level of Consciousness:  sedated, patient cooperative and responds to stimulation  Airway & Oxygen Therapy:Patient Spontanous Breathing and Patient connected to face mask oxgen  Post-op Assessment:  Report given to PACU RN and Post -op Vital signs reviewed and stable  Post vital signs:  Reviewed and stable  Last Vitals:  Vitals:   10/21/17 1359 10/21/17 1402  BP: (!) 74/32 (!) 68/33  Pulse: 82 79  Resp: 17 (!) 26  Temp: 36.4 C   SpO2: 269% 485%    Complications: No apparent anesthesia complications

## 2017-10-21 NOTE — Op Note (Signed)
[facility] Patient Name: Austin Jackson Procedure Date: 10/21/2017 MRN: [id number] Attending MD: Lear Ng , MD Date of Birth: [birth year] CSN: [id number] Age: 84 Admit Type: Inpatient Procedure:                Upper GI endoscopy Indications:              Suspected upper gastrointestinal bleeding, Acute                            post hemorrhagic anemia, Melena Providers:                Lear Ng, MD, Vista Lawman, RN, Baird Cancer, RN, Cherylynn Ridges, Technician Referring MD:              Medicines:                Propofol per Anesthesia, Monitored Anesthesia Care Complications:            No immediate complications. Estimated Blood Loss:     Estimated blood loss: none. Procedure:                Pre-Anesthesia Assessment:                           - Prior to the procedure, a History and Physical                            was performed, and patient medications and                            allergies were reviewed. The patient's tolerance of                            previous anesthesia was also reviewed. The risks                            and benefits of the procedure and the sedation                            options and risks were discussed with the patient.                            All questions were answered, and informed consent                            was obtained. Prior Anticoagulants: The patient has                            taken no previous anticoagulant or antiplatelet                            agents. ASA Grade Assessment: III - A patient with  severe systemic disease. After reviewing the risks                            and benefits, the patient was deemed in                            satisfactory condition to undergo the procedure.                           After obtaining informed consent, the endoscope was                            passed under direct vision.  Throughout the                            procedure, the patient's blood pressure, pulse, and                            oxygen saturations were monitored continuously. The                            EG-[id number] ([id number]) scope was introduced through the                            mouth, and advanced to the second part of duodenum.                            The upper GI endoscopy was accomplished without                            difficulty. The patient tolerated the procedure                            well. Scope In: Scope Out: Findings:      Patchy, yellow plaques were found in the middle third of the esophagus.      The Z-line was regular and was found 40 cm from the incisors.      One non-bleeding superficial gastric ulcer with pigmented material was       found on the greater curvature of the stomach. The lesion was 10 mm in       largest dimension.      Patchy moderate inflammation characterized by congestion (edema) and       erythema was found in the gastric body and in the gastric antrum.      The cardia and gastric fundus were normal on retroflexion.      A few small angiodysplastic lesions without bleeding were found in the       duodenal bulb and in the second portion of the duodenum.      A non-bleeding diverticulum with a large opening and no stigmata of       recent bleeding was found in the distal esophagus. Impression:               - Esophageal plaques were found, consistent with  candidiasis.                           - Z-line regular, 40 cm from the incisors.                           - Non-bleeding gastric ulcer with pigmented                            material.                           - Acute gastritis.                           - A few non-bleeding angiodysplastic lesions in the                            duodenum.                           - Diverticulum in the distal esophagus.                           - No specimens  collected. Moderate Sedation:      N/A- Per Anesthesia Care Recommendation:           - Clear liquid diet.                           - Observe patient's clinical course.                           - Give Protonix (pantoprazole): 8 mg/hr IV by                            continuous infusion. Procedure Code(s):        --- Professional ---                           [phone], Esophagogastroduodenoscopy, flexible,                            transoral; diagnostic, including collection of                            specimen(s) by brushing or washing, when performed                            (separate procedure) Diagnosis Code(s):        --- Professional ---                           D62, Acute posthemorrhagic anemia                           K92.1, Melena (includes Hematochezia)  K25.9, Gastric ulcer, unspecified as acute or                            chronic, without hemorrhage or perforation                           K29.00, Acute gastritis without bleeding                           K31.819, Angiodysplasia of stomach and duodenum                            without bleeding                           K22.9, Disease of esophagus, unspecified                           Q39.6, Congenital diverticulum of esophagus CPT copyright 2017 [employer]. All rights reserved. The codes documented in this report are preliminary and upon coder review may  be revised to meet current compliance requirements. Lear Ng, MD 10/21/2017 1:57:48 PM This report has been signed electronically. Number of Addenda: 0

## 2017-10-21 NOTE — Progress Notes (Signed)
[facility] PROGRESS NOTE    Progress Note  Austin Jackson  TKZ:[id number] DOB: 1920/06/12 DOA: 10/20/2017 PCP: Neale Burly, MD     Brief Narrative:   Austin [facility] [place] is an 84 y.o. male past medical history significant of bladder cancer, with kidney involvement status post total nephrectomy last year with left ureteral stent and hydronephrosis, also with history of GI bleed, atrial fibrillation, who her caregiver noted hematuria and black stool, UA show multiple blood cells and white cells but no bacteria or nitrates  Assessment/Plan:   Melena Patient is not on any anticoagulation, continue IV Protonix, there is no abdominal pain on physical exam We will continue to monitor CBC GI has been consulted for further evaluation. Awaiting [facility] further recommendations the patient is n.p.o.  Chronic atrial fibrillation: Rate controlled not on any anticoagulation.  Chronic kidney disease stage III: Creatinine seems to be at baseline.  Hematuria: Unclear whether this was a UTI or it was mixed with stools, will repeat a UA tomorrow morning.  Essential hypertension Pressure appears to be well controlled. \ Acute confusional state: Overnight he was confused we will start him on Seroquel as needed and Haldol IV as needed get a 12-lead EKG tomorrow morning.  DVT prophylaxis: SCD Family Communication:none Disposition Plan/Barrier to D/C: home in 2 days Code Status:     Code Status Orders  (From admission, onward)        Start     Ordered   10/20/17 2310  Do not attempt resuscitation (DNR)  Continuous    Question Answer Comment  In the event of cardiac or respiratory ARREST Do not call a "code blue"   In the event of cardiac or respiratory ARREST Do not perform Intubation, CPR, defibrillation or ACLS   In the event of cardiac or respiratory ARREST Use medication by any route, position, wound care, and other measures to relive pain and suffering. May use oxygen,  suction and manual treatment of airway obstruction as needed for comfort.      10/20/17 2309    Code Status History    Date Active Date Inactive Code Status Order ID Comments User Context   05/08/2017 0538 05/09/2017 1734 DNR [id number]  [name], MD Inpatient   07/19/2016 0330 07/23/2016 1707 DNR [id number]  [name], MD ED   07/12/2016 1426 07/13/2016 1249 Full Code [id number]  [name], MD Inpatient    Advance Directive Documentation     Most Recent Value  Type of Advance Directive  Healthcare Power of [place], Living will  Pre-existing out of facility DNR order (yellow form or pink MOST form)  -  "MOST" Form in Place?  -        IV Access:    Peripheral IV   Procedures and diagnostic studies:   Ct Renal Stone Study  Result Date: 10/20/2017 CLINICAL DATA:  Hematuria, LEFT-sided flank pain, prior RIGHT nephrectomy, history bladder cancer, prior LEFT ureteral stent, CHF, atrial fibrillation, GERD, hypertension, former smoker, stage III chronic kidney disease EXAM: CT ABDOMEN AND PELVIS WITHOUT CONTRAST TECHNIQUE: Multidetector CT imaging of the abdomen and pelvis was performed following the standard protocol without IV contrast. Sagittal and coronal MPR images reconstructed from axial data set. Oral contrast was not administered. COMPARISON:  07/05/2017 FINDINGS: Lower chest: Increased interstitial changes at the lung bases particularly in periphery of RIGHT lower lobe. Subsegmental atelectasis at lung bases. Enlargement of cardiac chambers diffusely. Hepatobiliary: Contracted gallbladder containing a small calculus. Dilated IVC and  hepatic veins question passive congestion of liver. No focal hepatic mass lesion. Pancreas: Atrophic, otherwise unremarkable Spleen: Normal appearance.  Incidental note of a small splenule. Adrenals/Urinary Tract: Prior RIGHT nephrectomy. LEFT adrenal nodule 16 x 12 mm unchanged. No LEFT renal mass. Dilated LEFT renal collecting system  and ureter containing a ureteral stent which extends into the urinary bladder. Bladder unremarkable. Enlarged prostate gland with central filling defect question prior TURP. No definite bladder mass visualized. Stomach/Bowel: Descending and sigmoid diverticulosis without evidence of diverticulitis. Stomach and bowel loops otherwise normal appearance Vascular/Lymphatic: Extensive atherosclerotic calcifications of coronary arteries, aorta, iliac arteries, and visceral branch vessels. High-grade narrowing of RIGHT common iliac artery and LEFT external iliac artery. Aorta normal caliber. No adenopathy. Few RIGHT pelvic varices. Reproductive: N/A Other: No free air or free fluid. Ventral hernia containing fat in epigastrium, defect measuring 12 x 8 mm. No bowel herniation. No acute inflammatory process. Musculoskeletal: Osseous demineralization with degenerative changes and scoliosis of lumbar spine. Spinal stenosis at L2-L3 and L3-L4. IMPRESSION: Cholelithiasis. Slightly increased chronic interstitial lung disease changes at lung bases with mild bibasilar atelectasis. Persistent LEFT hydronephrosis and hydroureter despite ureteral stent. Distal colonic diverticulosis without evidence of diverticulitis. Small ventral hernia in epigastrium containing fat. No acute intra-abdominal or intrapelvic process identified. Extensive atherosclerotic calcifications including coronary arteries and suspected high-grade narrowings of the RIGHT common iliac and LEFT external iliac arteries. Aortic Atherosclerosis (ICD10-I70.0). Electronically Signed   By: Lavonia Dana M.D.   On: 10/20/2017 18:16     Medical Consultants:    None.  Anti-Infectives:   None  Subjective:    Austin Jackson he denies any abdominal pain he had a melanotic stool this morning.  Objective:    Vitals:   10/21/17 0246 10/21/17 0551 10/21/17 0612 10/21/17 0632  BP: 96/60 (!) 82/45 (!) 90/52   Pulse: 77 79 92 93  Resp: 18 16 18 18   Temp:  97.7 F (36.5 C) 97.6 F (36.4 C) 97.7 F (36.5 C) 97.6 F (36.4 C)  TempSrc: Axillary Axillary Axillary Axillary  SpO2: 100% 98% 99% 98%  Weight:      Height:        Intake/Output Summary (Last 24 hours) at 10/21/2017 0831 Last data filed at 10/21/2017 0551 Gross per 24 hour  Intake 1073.75 ml  Output -  Net 1073.75 ml   Filed Weights   10/20/17 2315  Weight: 63.5 kg (140 lb)    Exam: General exam: In no acute distress. Respiratory system: Good air movement and clear to auscultation. Cardiovascular system: S1 & S2 heard, RRR.  Gastrointestinal system: Abdomen is nondistended, soft and nontender.  Central nervous system: Alert and oriented. No focal neurological deficits. Extremities: No pedal edema. Skin: No rashes, lesions or ulcers Psychiatry: Judgement and insight appear normal. Mood & affect appropriate.    Data Reviewed:    Labs: Basic Metabolic Panel: Recent Labs  Lab 10/20/17 1743  NA 136  K 4.6  CL 103  CO2 26  GLUCOSE 151*  BUN 48*  CREATININE 1.77*  CALCIUM 8.7*   GFR Estimated Creatinine Clearance: 20.9 mL/min (A) (by C-G formula based on SCr of 1.77 mg/dL (H)). Liver Function Tests: No results for input(s): AST, ALT, ALKPHOS, BILITOT, PROT, ALBUMIN in the last 168 hours. No results for input(s): LIPASE, AMYLASE in the last 168 hours. No results for input(s): AMMONIA in the last 168 hours. Coagulation profile Recent Labs  Lab 10/20/17 2105  INR 1.23    CBC: Recent Labs  Lab 10/20/17 1743 10/20/17 2336  WBC 3.6* 4.9  NEUTROABS 1.7  --   HGB 8.6* 9.0*  HCT 26.3* 27.6*  MCV 98.5 98.2  PLT 172 190   Cardiac Enzymes: No results for input(s): CKTOTAL, CKMB, CKMBINDEX, TROPONINI in the last 168 hours. BNP (last 3 results) No results for input(s): PROBNP in the last 8760 hours. CBG: No results for input(s): GLUCAP in the last 168 hours. D-Dimer: No results for input(s): DDIMER in the last 72 hours. Hgb A1c: No results for  input(s): HGBA1C in the last 72 hours. Lipid Profile: No results for input(s): CHOL, HDL, LDLCALC, TRIG, CHOLHDL, LDLDIRECT in the last 72 hours. Thyroid function studies: No results for input(s): TSH, T4TOTAL, T3FREE, THYROIDAB in the last 72 hours.  Invalid input(s): FREET3 Anemia work up: No results for input(s): VITAMINB12, FOLATE, FERRITIN, TIBC, IRON, RETICCTPCT in the last 72 hours. Sepsis Labs: Recent Labs  Lab 10/20/17 1743 10/20/17 2336  WBC 3.6* 4.9   Microbiology No results found for this or any previous visit (from the past 240 hour(s)).   Medications:   . acetaminophen  1,000 mg Oral Q12H  . brimonidine  1 drop Both Eyes TID  . ferrous sulfate  325 mg Oral BID  . latanoprost  1 drop Both Eyes QHS  . levothyroxine  75 mcg Oral QAC breakfast  . metoprolol tartrate  12.5 mg Oral BID  . tamsulosin  0.4 mg Oral QHS   Continuous Infusions: . sodium chloride 50 mL/hr at 10/21/17 0021  . pantoprozole (PROTONIX) infusion 8 mg/hr (10/21/17 0729)      LOS: 1 day   Charlynne Cousins  [facility] Pager [phone]  *Please refer to [email or web address], password [email or web address] to get updated schedule on who will round on this patient, as hospitalists switch teams weekly. If 7PM-7AM, please contact night-coverage at [email or web address], password [email or web address] for any overnight needs.  10/21/2017, 8:31 AM

## 2017-10-21 NOTE — Anesthesia Preprocedure Evaluation (Addendum)
Anesthesia Evaluation  Patient identified by MRN, date of birth, ID band Patient awake    Reviewed: Allergy & Precautions, NPO status , Patient's Chart, lab work & pertinent test results  History of Anesthesia Complications (+) PONV  Airway Mallampati: II  TM Distance: >3 FB     Dental   Pulmonary former smoker,    breath sounds clear to auscultation       Cardiovascular hypertension, +CHF  + dysrhythmias  Rhythm:Regular Rate:Normal     Neuro/Psych    GI/Hepatic GERD  ,History noted. CG   Endo/Other  Hypothyroidism   Renal/GU Renal disease     Musculoskeletal  (+) Arthritis ,   Abdominal   Peds  Hematology  (+) anemia ,   Anesthesia Other Findings   Reproductive/Obstetrics                            Anesthesia Physical Anesthesia Plan  ASA: III  Anesthesia Plan: MAC   Post-op Pain Management:    Induction: Intravenous  PONV Risk Score and Plan: Treatment may vary due to age or medical condition  Airway Management Planned: Simple Face Mask and Nasal Cannula  Additional Equipment:   Intra-op Plan:   Post-operative Plan:   Informed Consent: I have reviewed the patients History and Physical, chart, labs and discussed the procedure including the risks, benefits and alternatives for the proposed anesthesia with the patient or authorized representative who has indicated his/her understanding and acceptance.   Dental advisory given  Plan Discussed with: CRNA and Anesthesiologist  Anesthesia Plan Comments:        Anesthesia Quick Evaluation

## 2017-10-22 DIAGNOSIS — I10 Essential (primary) hypertension: Secondary | ICD-10-CM

## 2017-10-22 DIAGNOSIS — K922 Gastrointestinal hemorrhage, unspecified: Secondary | ICD-10-CM

## 2017-10-22 LAB — CBC
HCT: 33.8 % — ABNORMAL LOW (ref 39.0–52.0)
HCT: 33.6 % — ABNORMAL LOW (ref 39.0–52.0)
HEMOGLOBIN: 11 g/dL — AB (ref 13.0–17.0)
Hemoglobin: 11 g/dL — ABNORMAL LOW (ref 13.0–17.0)
MCH: 30.8 pg (ref 26.0–34.0)
MCH: 31.2 pg (ref 26.0–34.0)
MCHC: 32.5 g/dL (ref 30.0–36.0)
MCHC: 32.7 g/dL (ref 30.0–36.0)
MCV: 94.7 fL (ref 78.0–100.0)
MCV: 95.2 fL (ref 78.0–100.0)
Platelets: 152 10*3/uL (ref 150–400)
Platelets: 145 10*3/uL — ABNORMAL LOW (ref 150–400)
RBC: 3.57 MIL/uL — ABNORMAL LOW (ref 4.22–5.81)
RBC: 3.53 MIL/uL — ABNORMAL LOW (ref 4.22–5.81)
RDW: 18.5 % — AB (ref 11.5–15.5)
RDW: 18.4 % — AB (ref 11.5–15.5)
WBC: 4.5 10*3/uL (ref 4.0–10.5)
WBC: 3.8 10*3/uL — AB (ref 4.0–10.5)

## 2017-10-22 LAB — URINALYSIS, ROUTINE W REFLEX MICROSCOPIC
Bilirubin Urine: NEGATIVE
Glucose, UA: NEGATIVE mg/dL
KETONES UR: NEGATIVE mg/dL
NITRITE: NEGATIVE
PH: 6 (ref 5.0–8.0)
Protein, ur: 100 mg/dL — AB
Specific Gravity, Urine: 1.011 (ref 1.005–1.030)
Squamous Epithelial / LPF: NONE SEEN

## 2017-10-22 LAB — BPAM RBC
BLOOD PRODUCT EXPIRATION DATE: 201905092359
Blood Product Expiration Date: 201905092359
ISSUE DATE / TIME: 201904140211
ISSUE DATE / TIME: 201904140604
Unit Type and Rh: 6200
Unit Type and Rh: 6200

## 2017-10-22 LAB — TYPE AND SCREEN
ABO/RH(D): A POS
ANTIBODY SCREEN: NEGATIVE
Unit division: 0
Unit division: 0

## 2017-10-22 MED ORDER — FLUCONAZOLE 100 MG PO TABS
100.0000 mg | ORAL_TABLET | Freq: Every day | ORAL | Status: DC
  Administered 2017-10-23: 100 mg via ORAL
  Filled 2017-10-22: qty 1

## 2017-10-22 MED ORDER — FLUCONAZOLE IN SODIUM CHLORIDE 200-0.9 MG/100ML-% IV SOLN
200.0000 mg | Freq: Once | INTRAVENOUS | Status: AC
  Administered 2017-10-22: 200 mg via INTRAVENOUS
  Filled 2017-10-22: qty 100

## 2017-10-22 NOTE — Progress Notes (Signed)
[facility] PROGRESS NOTE    Progress Note  [name]  SWN:[id number] DOB: 1920/02/20 DOA: 10/20/2017 PCP: Neale Burly, MD     Brief Narrative:   Austin Jackson is an 84 y.o. male past medical history significant of bladder cancer, with kidney involvement status post total nephrectomy last year with left ureteral stent and hydronephrosis, also with history of GI bleed, atrial fibrillation, who her caregiver noted hematuria and black stool, UA show multiple blood cells and white cells but no bacteria or nitrates  Assessment/Plan:   Duodenal ulcer: He status post 2 units of packed red blood cells GI was consulted who performed an EGD that showed: Non-bleeding gastric ulcer with pigmented material. Acute gastritis.  A few non-bleeding angiodysplastic lesions in the   Continue IV Protonix.        Operating clear liquid diet will advance.     Hemoglobin stable hemoglobin. Further management per GI.  Esophageal Candida: Start Diflucan  Chronic atrial fibrillation: Rate controlled not on any anticoagulation.  Chronic kidney disease stage III: Creatinine seems to be at baseline.  Hematuria: With a history of a chronic left urethral stricture and a solitary kidney status post cystoscopy with left retrograde pyelogram and stent exchange Spoke with his urologist over the phone and Dr. Ellsworth Lennox he relates there is no need for further evaluation.  Essential hypertension Pressure appears to be well controlled.  Acute confusional state: No further episodes of confusion overnight, use IV Haldol as needed.  DVT prophylaxis: SCD Family Communication:none Disposition Plan/Barrier to D/C: home in 2 days Code Status:     Code Status Orders  (From admission, onward)        Start     Ordered   10/20/17 2310  Do not attempt resuscitation (DNR)  Continuous    Question Answer Comment  In the event of cardiac or respiratory ARREST Do not call a "code blue"   In the event  of cardiac or respiratory ARREST Do not perform Intubation, CPR, defibrillation or ACLS   In the event of cardiac or respiratory ARREST Use medication by any route, position, wound care, and other measures to relive pain and suffering. May use oxygen, suction and manual treatment of airway obstruction as needed for comfort.      10/20/17 2309    Code Status History    Date Active Date Inactive Code Status Order ID Comments User Context   05/08/2017 0538 05/09/2017 1734 DNR [id number]  Austin Milan, MD Inpatient   07/19/2016 0330 07/23/2016 1707 DNR [id number]  Austin Patience, MD ED   07/12/2016 1426 07/13/2016 1249 Full Code [id number]  Austin Frock, MD Inpatient    Advance Directive Documentation     Most Recent Value  Type of Advance Directive  Healthcare Power of [place], Living will  Pre-existing out of facility DNR order (yellow form or pink MOST form)  -  "MOST" Form in Place?  -        IV Access:    Peripheral IV   Procedures and diagnostic studies:   Ct Renal Stone Study  Result Date: 10/20/2017 CLINICAL DATA:  Hematuria, LEFT-sided flank pain, prior RIGHT nephrectomy, history bladder cancer, prior LEFT ureteral stent, CHF, atrial fibrillation, GERD, hypertension, former smoker, stage III chronic kidney disease EXAM: CT ABDOMEN AND PELVIS WITHOUT CONTRAST TECHNIQUE: Multidetector CT imaging of the abdomen and pelvis was performed following the standard protocol without IV contrast. Sagittal and coronal MPR images reconstructed from axial data set. Oral contrast  was not administered. COMPARISON:  07/05/2017 FINDINGS: Lower chest: Increased interstitial changes at the lung bases particularly in periphery of RIGHT lower lobe. Subsegmental atelectasis at lung bases. Enlargement of cardiac chambers diffusely. Hepatobiliary: Contracted gallbladder containing a small calculus. Dilated IVC and hepatic veins question passive congestion of liver. No focal hepatic mass lesion.  Pancreas: Atrophic, otherwise unremarkable Spleen: Normal appearance.  Incidental note of a small splenule. Adrenals/Urinary Tract: Prior RIGHT nephrectomy. LEFT adrenal nodule 16 x 12 mm unchanged. No LEFT renal mass. Dilated LEFT renal collecting system and ureter containing a ureteral stent which extends into the urinary bladder. Bladder unremarkable. Enlarged prostate gland with central filling defect question prior TURP. No definite bladder mass visualized. Stomach/Bowel: Descending and sigmoid diverticulosis without evidence of diverticulitis. Stomach and bowel loops otherwise normal appearance Vascular/Lymphatic: Extensive atherosclerotic calcifications of coronary arteries, aorta, iliac arteries, and visceral branch vessels. High-grade narrowing of RIGHT common iliac artery and LEFT external iliac artery. Aorta normal caliber. No adenopathy. Few RIGHT pelvic varices. Reproductive: N/A Other: No free air or free fluid. Ventral hernia containing fat in epigastrium, defect measuring 12 x 8 mm. No bowel herniation. No acute inflammatory process. Musculoskeletal: Osseous demineralization with degenerative changes and scoliosis of lumbar spine. Spinal stenosis at L2-L3 and L3-L4. IMPRESSION: Cholelithiasis. Slightly increased chronic interstitial lung disease changes at lung bases with mild bibasilar atelectasis. Persistent LEFT hydronephrosis and hydroureter despite ureteral stent. Distal colonic diverticulosis without evidence of diverticulitis. Small ventral hernia in epigastrium containing fat. No acute intra-abdominal or intrapelvic process identified. Extensive atherosclerotic calcifications including coronary arteries and suspected high-grade narrowings of the RIGHT common iliac and LEFT external iliac arteries. Aortic Atherosclerosis (ICD10-I70.0). Electronically Signed   By: Lavonia Dana M.D.   On: 10/20/2017 18:16     Medical Consultants:    None.  Anti-Infectives:   None  Subjective:     Austin Jackson denies any abdominal pain, no stools overnight tolerating his diet.  Objective:    Vitals:   10/21/17 1452 10/21/17 1649 10/21/17 2145 10/22/17 0553  BP: 127/84 102/61 98/64 (!) 107/59  Pulse: 89 (!) 101 74 73  Resp: 20 (!) 21 16 18   Temp: (!) 97.5 F (36.4 C) 97.7 F (36.5 C) 97.7 F (36.5 C) 97.8 F (36.6 C)  TempSrc: Oral Oral Axillary Oral  SpO2: 100% 98% 98% 98%  Weight:      Height:        Intake/Output Summary (Last 24 hours) at 10/22/2017 0819 Last data filed at 10/22/2017 0800 Gross per 24 hour  Intake 2641.67 ml  Output 300 ml  Net 2341.67 ml   Filed Weights   10/20/17 2315 10/21/17 1315  Weight: 63.5 kg (140 lb) 63.5 kg (140 lb)    Exam: General exam: In no acute distress. Respiratory system: Good air movement and clear to auscultation. Cardiovascular system: S1 & S2 heard, RRR.  Gastrointestinal system: Abdomen is nondistended, soft and nontender.  Central nervous system: Alert and oriented. No focal neurological deficits. Extremities: No pedal edema. Skin: No rashes, lesions or ulcers Psychiatry: Judgement and insight appear normal. Mood & affect appropriate.    Data Reviewed:    Labs: Basic Metabolic Panel: Recent Labs  Lab 10/20/17 1743 10/21/17 1643  NA 136 134*  K 4.6 4.9  CL 103 105  CO2 26 21*  GLUCOSE 151* 189*  BUN 48* 40*  CREATININE 1.77* 1.63*  CALCIUM 8.7* 8.2*   GFR Estimated Creatinine Clearance: 22.7 mL/min (A) (by C-G formula based on SCr of  1.63 mg/dL (H)). Liver Function Tests: Recent Labs  Lab 10/21/17 1643  AST 18  ALT 8*  ALKPHOS 61  BILITOT 1.1  PROT 7.3  ALBUMIN 2.5*   No results for input(s): LIPASE, AMYLASE in the last 168 hours. No results for input(s): AMMONIA in the last 168 hours. Coagulation profile Recent Labs  Lab 10/20/17 2105  INR 1.23    CBC: Recent Labs  Lab 10/20/17 1743 10/20/17 2336 10/21/17 1643 10/22/17 0502  WBC 3.6* 4.9 5.6 4.5  NEUTROABS 1.7  --    --   --   HGB 8.6* 9.0* 11.2* 11.0*  HCT 26.3* 27.6* 34.4* 33.8*  MCV 98.5 98.2 94.2 94.7  PLT 172 190 157 152   Cardiac Enzymes: No results for input(s): CKTOTAL, CKMB, CKMBINDEX, TROPONINI in the last 168 hours. BNP (last 3 results) No results for input(s): PROBNP in the last 8760 hours. CBG: No results for input(s): GLUCAP in the last 168 hours. D-Dimer: No results for input(s): DDIMER in the last 72 hours. Hgb A1c: No results for input(s): HGBA1C in the last 72 hours. Lipid Profile: No results for input(s): CHOL, HDL, LDLCALC, TRIG, CHOLHDL, LDLDIRECT in the last 72 hours. Thyroid function studies: No results for input(s): TSH, T4TOTAL, T3FREE, THYROIDAB in the last 72 hours.  Invalid input(s): FREET3 Anemia work up: No results for input(s): VITAMINB12, FOLATE, FERRITIN, TIBC, IRON, RETICCTPCT in the last 72 hours. Sepsis Labs: Recent Labs  Lab 10/20/17 1743 10/20/17 2336 10/21/17 1643 10/22/17 0502  WBC 3.6* 4.9 5.6 4.5   Microbiology No results found for this or any previous visit (from the past 240 hour(s)).   Medications:   . acetaminophen  1,000 mg Oral Q12H  . brimonidine  1 drop Both Eyes TID  . [START ON 10/23/2017] fluconazole  100 mg Oral Daily  . latanoprost  1 drop Both Eyes QHS  . levothyroxine  75 mcg Oral QAC breakfast  . metoprolol tartrate  12.5 mg Oral BID  . tamsulosin  0.4 mg Oral QHS   Continuous Infusions: . sodium chloride 50 mL/hr at 10/22/17 0544  . fluconazole (DIFLUCAN) IV    . pantoprozole (PROTONIX) infusion 8 mg/hr (10/22/17 0545)      LOS: 2 days   Charlynne Cousins  [facility] Pager [phone]  *Please refer to [email or web address], password [email or web address] to get updated schedule on who will round on this patient, as hospitalists switch teams weekly. If 7PM-7AM, please contact night-coverage at [email or web address], password [email or web address] for any overnight needs.  10/22/2017, 8:19 AM

## 2017-10-22 NOTE — Progress Notes (Signed)
[facility] Progress Note  [name] 84 y.o. 1919/09/29   Subjective: Black stool this afternoon. Denies abdominal pain. Tolerating clear liquid diet. Nurse and tech in room.  Objective: Vital signs: Vitals:   10/22/17 0553 10/22/17 1034  BP: (!) 107/59 111/63  Pulse: 73 (!) 108  Resp: 18   Temp: 97.8 F (36.6 C)   SpO2: 98%     Physical Exam: Gen: lethargic, elderly, thin, no acute distress, pleasant HEENT: anicteric sclera CV: RRR Chest: CTA B Abd: minimal diffuse tenderness with guarding, soft, nondistended, +BS   Lab Results: Recent Labs    10/20/17 1743 10/21/17 1643  NA 136 134*  K 4.6 4.9  CL 103 105  CO2 26 21*  GLUCOSE 151* 189*  BUN 48* 40*  CREATININE 1.77* 1.63*  CALCIUM 8.7* 8.2*   Recent Labs    10/21/17 1643  AST 18  ALT 8*  ALKPHOS 61  BILITOT 1.1  PROT 7.3  ALBUMIN 2.5*   Recent Labs    10/20/17 1743  10/21/17 1643 10/22/17 0502  WBC 3.6*   < > 5.6 4.5  NEUTROABS 1.7  --   --   --   HGB 8.6*   < > 11.2* 11.0*  HCT 26.3*   < > 34.4* 33.8*  MCV 98.5   < > 94.2 94.7  PLT 172   < > 157 152   < > = values in this interval not displayed.      Assessment/Plan: Peptic ulcer bleed with gastric ulcer with bleeding stigmata seen on EGD. Hgb stable. No further planned GI procedures. Advance diet. D/C on PPI PO BID for 3 months and then QD. Ok to d/c tomorrow if can tolerate diet from GI standpoint. F/U with GI prn. Will sign off. Call if questions.   Madison C. 10/22/2017, 1:29 PM  Pager [phone]  AFTER 5 PM or on weekends please call [phone] ID: Austin Jackson, male   DOB: 1920/05/17, 84 y.o.   MRN: [id number]

## 2017-10-22 NOTE — Plan of Care (Signed)

## 2017-10-23 DIAGNOSIS — C641 Malignant neoplasm of right kidney, except renal pelvis: Secondary | ICD-10-CM

## 2017-10-23 LAB — CBC
HCT: 33.6 % — ABNORMAL LOW (ref 39.0–52.0)
HEMOGLOBIN: 10.8 g/dL — AB (ref 13.0–17.0)
MCH: 31.1 pg (ref 26.0–34.0)
MCHC: 32.1 g/dL (ref 30.0–36.0)
MCV: 96.8 fL (ref 78.0–100.0)
Platelets: 151 10*3/uL (ref 150–400)
RBC: 3.47 MIL/uL — AB (ref 4.22–5.81)
RDW: 18.2 % — ABNORMAL HIGH (ref 11.5–15.5)
WBC: 4.2 10*3/uL (ref 4.0–10.5)

## 2017-10-23 MED ORDER — FLUCONAZOLE 100 MG PO TABS: 100.0000 mg | ORAL_TABLET | Freq: Every day | ORAL | 0 refills | Status: DC

## 2017-10-23 MED ORDER — PANTOPRAZOLE SODIUM 40 MG PO TBEC: 40.0000 mg | DELAYED_RELEASE_TABLET | Freq: Two times a day (BID) | ORAL | 3 refills | Status: DC

## 2017-10-23 MED ORDER — PANTOPRAZOLE SODIUM 40 MG PO TBEC
40.0000 mg | DELAYED_RELEASE_TABLET | Freq: Two times a day (BID) | ORAL | Status: DC
  Administered 2017-10-23: 40 mg via ORAL
  Filled 2017-10-23: qty 1

## 2017-10-23 NOTE — Care Management Important Message (Signed)
Important Message  Patient Details  Name: ANVAY TENNIS MRN: [id number] Date of Birth: 09/29/1919   Medicare Important Message Given:  Yes    Kerin Salen 10/23/2017, 10:33 AMImportant Message  Patient Details  Name: KIERON KANTNER MRN: [id number] Date of Birth: Jun 27, 1920   Medicare Important Message Given:  Yes    Kerin Salen 10/23/2017, 10:32 AM

## 2017-10-23 NOTE — Evaluation (Signed)
Physical Therapy One Time Evaluation Patient Details Name: Austin Jackson MRN: [id number] DOB: Feb 06, 1920 Today's Date: 10/23/2017   History of Present Illness  84 y.o. male past medical history significant of bladder cancer, with kidney involvement status post total nephrectomy last year with left ureteral stent and hydronephrosis, also with history of GI bleed, atrial fibrillation, HTN, OA, macular degeneration and admitted for melena due to duodenal ulcer  Clinical Impression  Patient evaluated by Physical Therapy with no further acute PT needs identified. All education has been completed and the patient has no further questions.  Pt agreeable to mobilize as he reports going home later today and wished to see his capabilities.  Pt pleasant and thankful for visit.  Pt reports sons check in frequently.  See below for any follow-up Physical Therapy or equipment needs. PT is signing off. Thank you for this referral.     Follow Up Recommendations Home health PT(if agreeable, son politely declined [facility] upon last admission)    Equipment Recommendations  None recommended by PT    Recommendations for Other Services       Precautions / Restrictions Precautions Precautions: Fall      Mobility  Bed Mobility               General bed mobility comments: pt up in recliner  Transfers Overall transfer level: Needs assistance Equipment used: Rolling walker (2 wheeled) Transfers: Sit to/from Stand Sit to Stand: Supervision         General transfer comment: pt requested only supervision, increased effort however able to perform without assist  Ambulation/Gait Ambulation/Gait assistance: Min guard;Supervision Ambulation Distance (Feet): 60 Feet Assistive device: Rolling walker (2 wheeled) Gait Pattern/deviations: Step-through pattern;Decreased stride length;Trunk flexed     General Gait Details: pt with increased trunk flexion, able to self correct RW too far forward, distance to  tolerance, pt reports moderate SOB however states this is his baseline for exertion  Stairs            Wheelchair Mobility    Modified Rankin (Stroke Patients Only)       Balance Overall balance assessment: History of Falls(reports a fall 2 weeks ago)                                           Pertinent Vitals/Pain Pain Assessment: No/denies pain    Home Living Family/patient expects to be discharged to:: Private residence Living Arrangements: Spouse/significant other Available Help at Discharge: Family;Available 24 hours/day;Personal care attendant Type of Home: House       Home Layout: One level Home Equipment: [place] - 2 wheels;Cane - single point      Prior Function Level of Independence: Independent with assistive device(s)               Hand Dominance        Extremity/Trunk Assessment        Lower Extremity Assessment Lower Extremity Assessment: Generalized weakness    Cervical / Trunk Assessment Cervical / Trunk Assessment: Kyphotic  Communication   Communication: HOH  Cognition Arousal/Alertness: Awake/alert Behavior During Therapy: WFL for tasks assessed/performed Overall Cognitive Status: Within Functional Limits for tasks assessed  General Comments      Exercises     Assessment/Plan    PT Assessment All further PT needs can be met in the next venue of care  PT Problem List Decreased strength;Decreased mobility;Decreased activity tolerance;Decreased knowledge of use of DME       PT Treatment Interventions      PT Goals (Current goals can be found in the Care Plan section)  Acute Rehab PT Goals PT Goal Formulation: All assessment and education complete, DC therapy    Frequency     Barriers to discharge        Co-evaluation               AM-PAC PT "6 Clicks" Daily Activity  Outcome Measure Difficulty turning over in bed (including  adjusting bedclothes, sheets and blankets)?: A Little Difficulty moving from lying on back to sitting on the side of the bed? : A Little Difficulty sitting down on and standing up from a chair with arms (e.g., wheelchair, bedside commode, etc,.)?: A Little Help needed moving to and from a bed to chair (including a wheelchair)?: A Little Help needed walking in hospital room?: A Little Help needed climbing 3-5 steps with a railing? : A Lot 6 Click Score: 17    End of Session   Activity Tolerance: Patient tolerated treatment well Patient left: in chair;with chair alarm set;with call bell/phone within reach Nurse Communication: Mobility status PT Visit Diagnosis: Difficulty in walking, not elsewhere classified (R26.2);History of falling (Z91.81)    Time: 1696-7893 PT Time Calculation (min) (ACUTE ONLY): 8 min   Charges:   PT Evaluation $PT Eval Low Complexity: 1 Low     PT G Codes:       Carmelia Bake, PT, DPT 10/23/2017 Pager: [phone]   York Ram E 10/23/2017, 1:18 PM

## 2017-10-23 NOTE — Discharge Summary (Signed)
Physician Discharge Summary  Austin Jackson CBJ:[id number] DOB: 1919-07-27 DOA: 10/20/2017  PCP: Neale Burly, MD  Admit date: 10/20/2017 Discharge date: 10/23/2017  Admitted From: home Disposition:  Home  Recommendations for Outpatient Follow-up:  1. Follow up with PCP in 1-2 weeks 2. Please obtain BMP/CBC in one week   Home Health:Yes Equipment/Devices:none  Discharge Condition:stable CODE STATUS:DNR Diet recommendation: Heart Healthy   Brief/Interim Summary: 84 y.o. male past medical history significant of bladder cancer, with kidney involvement status post total nephrectomy last year with left ureteral stent and hydronephrosis, also with history of GI bleed, atrial fibrillation, who her caregiver noted hematuria and black stool, UA show multiple blood cells and white cells but no bacteria or nitrates    Discharge Diagnoses:  Principal Problem:   Melena Active Problems:   Hypothyroidism   Hypertension   Urothelial carcinoma of kidney, right (HCC)   Chronic atrial fibrillation (HCC)   CKD (chronic kidney disease) stage 3, GFR 30-59 ml/min (HCC)  Duodenal ulcer: Guarded on IV Protonix, 2 units of packed red blood cells, GI was consulted who recommended  EGD that showed: Non-bleeding gastric ulcer with pigmented material. Acute gastritis.A few non-bleeding angiodysplastic lesions. Diet was advanced his Protonix was changed to oral and she will follow-up with GI as an outpatient. Hemoglobin stable hemoglobin.  Esophageal Candida: Otila Kluver Diflucan for 2 additional days.  Chronic atrial fibrillation: Rate controlled not on any anticoagulation.  Chronic kidney disease stage III: Creatinine seems to be at baseline.  Hematuria: With a history of a chronic left urethral stricture and a solitary kidney status post cystoscopy with left retrograde pyelogram and stent exchange Spoke with his urologist over the phone and Dr. Ellsworth Lennox he relates there is no need for further  evaluation.  Essential hypertension Changes made to his medication.  Acute confusional state: Resolved dose of Haldol.     Discharge Instructions  Discharge Instructions    Diet - low sodium heart healthy   Complete by:  As directed    Increase activity slowly   Complete by:  As directed      Allergies as of 10/23/2017      Reactions   Codeine Nausea And Vomiting   Lisinopril Cough   Ultram [tramadol Hcl] Other (See Comments)   Confusion       Medication List    TAKE these medications   acetaminophen 500 MG tablet Commonly known as:  TYLENOL Take 1,000 mg by mouth every 12 (twelve) hours. 6 am and 6 pm   ALPHAGAN P 0.1 % Soln Generic drug:  brimonidine Place 1 drop into both eyes 3 (three) times daily.   fluconazole 100 MG tablet Commonly known as:  DIFLUCAN Take 1 tablet (100 mg total) by mouth daily. Start taking on:  10/24/2017   furosemide 20 MG tablet Commonly known as:  LASIX Take 20 mg by mouth 3 (three) times daily.   Iron 325 (65 Fe) MG Tabs Take 325 mg by mouth 2 (two) times daily.   latanoprost 0.005 % ophthalmic solution Commonly known as:  XALATAN Place 1 drop into both eyes at bedtime.   levothyroxine 50 MCG tablet Commonly known as:  SYNTHROID, LEVOTHROID Take 75 mcg by mouth daily before breakfast.   lisinopril 2.5 MG tablet Commonly known as:  PRINIVIL,ZESTRIL TAKE 1 TABLET (2.5MG  BY MOUTH ONCE DAILY   metoprolol tartrate 25 MG tablet Commonly known as:  LOPRESSOR Take 12.5 mg by mouth 3 (three) times daily.   pantoprazole 40 MG tablet  Commonly known as:  PROTONIX Take 1 tablet (40 mg total) by mouth 2 (two) times daily.   tamsulosin 0.4 MG Caps capsule Commonly known as:  FLOMAX Take 0.4 mg by mouth at bedtime.       Allergies  Allergen Reactions  . Codeine Nausea And Vomiting  . Lisinopril Cough  . Ultram [Tramadol Hcl] Other (See Comments)    Confusion     Consultations:  GI   Procedures/Studies: Ct Renal  Stone Study  Result Date: 10/20/2017 CLINICAL DATA:  Hematuria, LEFT-sided flank pain, prior RIGHT nephrectomy, history bladder cancer, prior LEFT ureteral stent, CHF, atrial fibrillation, GERD, hypertension, former smoker, stage III chronic kidney disease EXAM: CT ABDOMEN AND PELVIS WITHOUT CONTRAST TECHNIQUE: Multidetector CT imaging of the abdomen and pelvis was performed following the standard protocol without IV contrast. Sagittal and coronal MPR images reconstructed from axial data set. Oral contrast was not administered. COMPARISON:  07/05/2017 FINDINGS: Lower chest: Increased interstitial changes at the lung bases particularly in periphery of RIGHT lower lobe. Subsegmental atelectasis at lung bases. Enlargement of cardiac chambers diffusely. Hepatobiliary: Contracted gallbladder containing a small calculus. Dilated IVC and hepatic veins question passive congestion of liver. No focal hepatic mass lesion. Pancreas: Atrophic, otherwise unremarkable Spleen: Normal appearance.  Incidental note of a small splenule. Adrenals/Urinary Tract: Prior RIGHT nephrectomy. LEFT adrenal nodule 16 x 12 mm unchanged. No LEFT renal mass. Dilated LEFT renal collecting system and ureter containing a ureteral stent which extends into the urinary bladder. Bladder unremarkable. Enlarged prostate gland with central filling defect question prior TURP. No definite bladder mass visualized. Stomach/Bowel: Descending and sigmoid diverticulosis without evidence of diverticulitis. Stomach and bowel loops otherwise normal appearance Vascular/Lymphatic: Extensive atherosclerotic calcifications of coronary arteries, aorta, iliac arteries, and visceral branch vessels. High-grade narrowing of RIGHT common iliac artery and LEFT external iliac artery. Aorta normal caliber. No adenopathy. Few RIGHT pelvic varices. Reproductive: N/A Other: No free air or free fluid. Ventral hernia containing fat in epigastrium, defect measuring 12 x 8 mm. No bowel  herniation. No acute inflammatory process. Musculoskeletal: Osseous demineralization with degenerative changes and scoliosis of lumbar spine. Spinal stenosis at L2-L3 and L3-L4. IMPRESSION: Cholelithiasis. Slightly increased chronic interstitial lung disease changes at lung bases with mild bibasilar atelectasis. Persistent LEFT hydronephrosis and hydroureter despite ureteral stent. Distal colonic diverticulosis without evidence of diverticulitis. Small ventral hernia in epigastrium containing fat. No acute intra-abdominal or intrapelvic process identified. Extensive atherosclerotic calcifications including coronary arteries and suspected high-grade narrowings of the RIGHT common iliac and LEFT external iliac arteries. Aortic Atherosclerosis (ICD10-I70.0). Electronically Signed   By: Lavonia Dana M.D.   On: 10/20/2017 18:16    Subjective: No complaints feels great.  Discharge Exam: Vitals:   10/22/17 2014 10/23/17 0414  BP: 125/84 108/70  Pulse: (!) 105 84  Resp: 20 18  Temp: 97.6 F (36.4 C) 97.6 F (36.4 C)  SpO2: 99% 100%   Vitals:   10/22/17 1034 10/22/17 1359 10/22/17 2014 10/23/17 0414  BP: 111/63 117/88 125/84 108/70  Pulse: (!) 108 87 (!) 105 84  Resp:  14 20 18   Temp:  98.2 F (36.8 C) 97.6 F (36.4 C) 97.6 F (36.4 C)  TempSrc:  Oral Oral Oral  SpO2:  97% 99% 100%  Weight:      Height:        General: Pt is alert, awake, not in acute distress Cardiovascular: RRR, S1/S2 +, no rubs, no gallops Respiratory: CTA bilaterally, no wheezing, no rhonchi Abdominal: Soft, NT, ND,  bowel sounds + Extremities: no edema, no cyanosis    The results of significant diagnostics from this hospitalization (including imaging, microbiology, ancillary and laboratory) are listed below for reference.     Microbiology: No results found for this or any previous visit (from the past 240 hour(s)).   Labs: BNP (last 3 results) No results for input(s): BNP in the last 8760 hours. Basic  Metabolic Panel: Recent Labs  Lab 10/20/17 1743 10/21/17 1643  NA 136 134*  K 4.6 4.9  CL 103 105  CO2 26 21*  GLUCOSE 151* 189*  BUN 48* 40*  CREATININE 1.77* 1.63*  CALCIUM 8.7* 8.2*   Liver Function Tests: Recent Labs  Lab 10/21/17 1643  AST 18  ALT 8*  ALKPHOS 61  BILITOT 1.1  PROT 7.3  ALBUMIN 2.5*   No results for input(s): LIPASE, AMYLASE in the last 168 hours. No results for input(s): AMMONIA in the last 168 hours. CBC: Recent Labs  Lab 10/20/17 1743 10/20/17 2336 10/21/17 1643 10/22/17 0502 10/22/17 1708 10/23/17 0446  WBC 3.6* 4.9 5.6 4.5 3.8* 4.2  NEUTROABS 1.7  --   --   --   --   --   HGB 8.6* 9.0* 11.2* 11.0* 11.0* 10.8*  HCT 26.3* 27.6* 34.4* 33.8* 33.6* 33.6*  MCV 98.5 98.2 94.2 94.7 95.2 96.8  PLT 172 190 157 152 145* 151   Cardiac Enzymes: No results for input(s): CKTOTAL, CKMB, CKMBINDEX, TROPONINI in the last 168 hours. BNP: Invalid input(s): POCBNP CBG: No results for input(s): GLUCAP in the last 168 hours. D-Dimer No results for input(s): DDIMER in the last 72 hours. Hgb A1c No results for input(s): HGBA1C in the last 72 hours. Lipid Profile No results for input(s): CHOL, HDL, LDLCALC, TRIG, CHOLHDL, LDLDIRECT in the last 72 hours. Thyroid function studies No results for input(s): TSH, T4TOTAL, T3FREE, THYROIDAB in the last 72 hours.  Invalid input(s): FREET3 Anemia work up No results for input(s): VITAMINB12, FOLATE, FERRITIN, TIBC, IRON, RETICCTPCT in the last 72 hours. Urinalysis    Component Value Date/Time   COLORURINE YELLOW 10/22/2017 0753   APPEARANCEUR CLOUDY (A) 10/22/2017 0753   LABSPEC 1.011 10/22/2017 0753   PHURINE 6.0 10/22/2017 0753   GLUCOSEU NEGATIVE 10/22/2017 0753   HGBUR LARGE (A) 10/22/2017 0753   BILIRUBINUR NEGATIVE 10/22/2017 0753   KETONESUR NEGATIVE 10/22/2017 0753   PROTEINUR 100 (A) 10/22/2017 0753   NITRITE NEGATIVE 10/22/2017 0753   LEUKOCYTESUR LARGE (A) 10/22/2017 0753   Sepsis  Labs Invalid input(s): PROCALCITONIN,  WBC,  LACTICIDVEN Microbiology No results found for this or any previous visit (from the past 240 hour(s)).   Time coordinating discharge: Over 30 minutes  SIGNED:   Charlynne Cousins, MD  [facility] 10/23/2017, 10:09 AM Pager   If 7PM-7AM, please contact night-coverage [email or web address] Password [email or web address]

## 2017-10-23 NOTE — Progress Notes (Signed)
Spoke with pt's son who is a MD at bedside concerning [place]. Pt have personal care sitters and will not need HH at present time. Son asked if Hospice will take a pt needing a ureteral stent q6 months. Pt can go to Hospice, but Hospice will not cover the cost of the ureteral stent. Son is aware of this information.

## 2017-11-26 DIAGNOSIS — K523 Indeterminate colitis: Secondary | ICD-10-CM | POA: Diagnosis not present

## 2017-11-26 DIAGNOSIS — I5022 Chronic systolic (congestive) heart failure: Secondary | ICD-10-CM | POA: Diagnosis not present

## 2017-11-26 DIAGNOSIS — I5023 Acute on chronic systolic (congestive) heart failure: Secondary | ICD-10-CM | POA: Diagnosis not present

## 2017-11-26 DIAGNOSIS — N401 Enlarged prostate with lower urinary tract symptoms: Secondary | ICD-10-CM | POA: Diagnosis not present

## 2017-11-26 DIAGNOSIS — E038 Other specified hypothyroidism: Secondary | ICD-10-CM | POA: Diagnosis not present

## 2017-12-05 DIAGNOSIS — N401 Enlarged prostate with lower urinary tract symptoms: Secondary | ICD-10-CM | POA: Diagnosis not present

## 2017-12-05 DIAGNOSIS — Z79899 Other long term (current) drug therapy: Secondary | ICD-10-CM | POA: Diagnosis not present

## 2017-12-05 DIAGNOSIS — I5022 Chronic systolic (congestive) heart failure: Secondary | ICD-10-CM | POA: Diagnosis not present

## 2017-12-05 DIAGNOSIS — E038 Other specified hypothyroidism: Secondary | ICD-10-CM | POA: Diagnosis not present

## 2017-12-08 DIAGNOSIS — B3741 Candidal cystitis and urethritis: Secondary | ICD-10-CM

## 2017-12-08 HISTORY — DX: Candidal cystitis and urethritis: B37.41

## 2017-12-22 ENCOUNTER — Emergency Department (HOSPITAL_COMMUNITY): Payer: Medicare Other

## 2017-12-22 ENCOUNTER — Other Ambulatory Visit: Payer: Self-pay

## 2017-12-22 ENCOUNTER — Encounter (HOSPITAL_COMMUNITY): Payer: Self-pay | Admitting: Emergency Medicine

## 2017-12-22 ENCOUNTER — Observation Stay (HOSPITAL_COMMUNITY)
Admission: EM | Admit: 2017-12-22 | Discharge: 2017-12-23 | Disposition: A | Discharge status: Home / Self Care | Payer: Medicare Other | Attending: Internal Medicine | Admitting: Internal Medicine

## 2017-12-22 DIAGNOSIS — D649 Anemia, unspecified: Secondary | ICD-10-CM | POA: Diagnosis not present

## 2017-12-22 DIAGNOSIS — R300 Dysuria: Principal | ICD-10-CM

## 2017-12-22 DIAGNOSIS — R1010 Upper abdominal pain, unspecified: Secondary | ICD-10-CM | POA: Diagnosis not present

## 2017-12-22 DIAGNOSIS — I10 Essential (primary) hypertension: Secondary | ICD-10-CM | POA: Diagnosis not present

## 2017-12-22 DIAGNOSIS — I129 Hypertensive chronic kidney disease with stage 1 through stage 4 chronic kidney disease, or unspecified chronic kidney disease: Secondary | ICD-10-CM | POA: Diagnosis not present

## 2017-12-22 DIAGNOSIS — J189 Pneumonia, unspecified organism: Secondary | ICD-10-CM

## 2017-12-22 DIAGNOSIS — N183 Chronic kidney disease, stage 3 unspecified: Secondary | ICD-10-CM | POA: Diagnosis present

## 2017-12-22 DIAGNOSIS — R079 Chest pain, unspecified: Secondary | ICD-10-CM | POA: Diagnosis not present

## 2017-12-22 DIAGNOSIS — I482 Chronic atrial fibrillation, unspecified: Secondary | ICD-10-CM | POA: Diagnosis present

## 2017-12-22 DIAGNOSIS — C61 Malignant neoplasm of prostate: Secondary | ICD-10-CM | POA: Diagnosis not present

## 2017-12-22 DIAGNOSIS — Z79899 Other long term (current) drug therapy: Secondary | ICD-10-CM | POA: Diagnosis not present

## 2017-12-22 DIAGNOSIS — R109 Unspecified abdominal pain: Secondary | ICD-10-CM | POA: Diagnosis present

## 2017-12-22 DIAGNOSIS — E039 Hypothyroidism, unspecified: Secondary | ICD-10-CM | POA: Diagnosis not present

## 2017-12-22 DIAGNOSIS — R102 Pelvic and perineal pain: Secondary | ICD-10-CM

## 2017-12-22 DIAGNOSIS — D500 Iron deficiency anemia secondary to blood loss (chronic): Secondary | ICD-10-CM | POA: Insufficient documentation

## 2017-12-22 DIAGNOSIS — N3090 Cystitis, unspecified without hematuria: Secondary | ICD-10-CM | POA: Insufficient documentation

## 2017-12-22 DIAGNOSIS — R1084 Generalized abdominal pain: Secondary | ICD-10-CM | POA: Diagnosis not present

## 2017-12-22 DIAGNOSIS — R10817 Generalized abdominal tenderness: Secondary | ICD-10-CM | POA: Diagnosis not present

## 2017-12-22 DIAGNOSIS — Z87891 Personal history of nicotine dependence: Secondary | ICD-10-CM | POA: Insufficient documentation

## 2017-12-22 DIAGNOSIS — B3741 Candidal cystitis and urethritis: Secondary | ICD-10-CM | POA: Diagnosis present

## 2017-12-22 DIAGNOSIS — R918 Other nonspecific abnormal finding of lung field: Secondary | ICD-10-CM | POA: Diagnosis not present

## 2017-12-22 LAB — URINALYSIS, ROUTINE W REFLEX MICROSCOPIC
BACTERIA UA: NONE SEEN
BILIRUBIN URINE: NEGATIVE
Glucose, UA: NEGATIVE mg/dL
KETONES UR: NEGATIVE mg/dL
Nitrite: NEGATIVE
PH: 6 (ref 5.0–8.0)
Protein, ur: 30 mg/dL — AB
RBC / HPF: >50 RBC/hpf — ABNORMAL HIGH (ref 0–5)
SPECIFIC GRAVITY, URINE: 1.009 (ref 1.005–1.030)
WBC, UA: >50 WBC/hpf — ABNORMAL HIGH (ref 0–5)

## 2017-12-22 LAB — COMPREHENSIVE METABOLIC PANEL
ALBUMIN: 2.8 g/dL — AB (ref 3.5–5.0)
ALK PHOS: 53 U/L (ref 38–126)
ALT: 9 U/L — AB (ref 17–63)
ANION GAP: 7 (ref 5–15)
AST: 15 U/L (ref 15–41)
BUN: 36 mg/dL — ABNORMAL HIGH (ref 6–20)
CALCIUM: 8.4 mg/dL — AB (ref 8.9–10.3)
CO2: 24 mmol/L (ref 22–32)
CREATININE: 2.29 mg/dL — AB (ref 0.61–1.24)
Chloride: 103 mmol/L (ref 101–111)
GFR calc Af Amer: 26 mL/min — ABNORMAL LOW (ref >60)
GFR calc non Af Amer: 23 mL/min — ABNORMAL LOW (ref >60)
GLUCOSE: 115 mg/dL — AB (ref 65–99)
Potassium: 3.9 mmol/L (ref 3.5–5.1)
SODIUM: 134 mmol/L — AB (ref 135–145)
TOTAL PROTEIN: 7.7 g/dL (ref 6.5–8.1)
Total Bilirubin: 0.7 mg/dL (ref 0.3–1.2)

## 2017-12-22 LAB — CBC WITH DIFFERENTIAL/PLATELET
BASOS PCT: 1 %
Basophils Absolute: 0 10*3/uL (ref 0.0–0.1)
EOS ABS: 0.1 10*3/uL (ref 0.0–0.7)
Eosinophils Relative: 1 %
HCT: 29.7 % — ABNORMAL LOW (ref 39.0–52.0)
HEMOGLOBIN: 9.2 g/dL — AB (ref 13.0–17.0)
LYMPHS ABS: 1.3 10*3/uL (ref 0.7–4.0)
Lymphocytes Relative: 33 %
MCH: 31.5 pg (ref 26.0–34.0)
MCHC: 31 g/dL (ref 30.0–36.0)
MCV: 101.7 fL — ABNORMAL HIGH (ref 78.0–100.0)
MONOS PCT: 14 %
Monocytes Absolute: 0.6 10*3/uL (ref 0.1–1.0)
NEUTROS PCT: 51 %
Neutro Abs: 1.9 10*3/uL (ref 1.7–7.7)
Platelets: 171 10*3/uL (ref 150–400)
RBC: 2.92 MIL/uL — ABNORMAL LOW (ref 4.22–5.81)
RDW: 17.1 % — AB (ref 11.5–15.5)
WBC: 3.8 10*3/uL — ABNORMAL LOW (ref 4.0–10.5)

## 2017-12-22 LAB — LACTIC ACID, PLASMA
LACTIC ACID, VENOUS: 1.5 mmol/L (ref 0.5–1.9)
LACTIC ACID, VENOUS: 1.5 mmol/L (ref 0.5–1.9)

## 2017-12-22 LAB — LIPASE, BLOOD: Lipase: 21 U/L (ref 11–51)

## 2017-12-22 LAB — TROPONIN I
TROPONIN I: 0.03 ng/mL — AB (ref <0.03)
Troponin I: 0.04 ng/mL (ref <0.03)

## 2017-12-22 MED ORDER — SODIUM CHLORIDE 0.9 % IV SOLN
INTRAVENOUS | Status: DC
  Administered 2017-12-22: 17:00:00 via INTRAVENOUS

## 2017-12-22 MED ORDER — METOPROLOL TARTRATE 25 MG PO TABS
12.5000 mg | ORAL_TABLET | Freq: Three times a day (TID) | ORAL | Status: DC
Start: 2017-12-22 — End: 2017-12-23
  Administered 2017-12-22 – 2017-12-23 (×2): 12.5 mg via ORAL
  Filled 2017-12-22 (×2): qty 1

## 2017-12-22 MED ORDER — LEVOTHYROXINE SODIUM 50 MCG PO TABS
75.0000 ug | ORAL_TABLET | Freq: Every day | ORAL | Status: DC
  Administered 2017-12-23: 75 ug via ORAL
  Filled 2017-12-22: qty 2

## 2017-12-22 MED ORDER — BRIMONIDINE TARTRATE 0.15 % OP SOLN
1.0000 [drp] | Freq: Three times a day (TID) | OPHTHALMIC | Status: DC
  Administered 2017-12-22 – 2017-12-23 (×2): 1 [drp] via OPHTHALMIC
  Filled 2017-12-22: qty 5

## 2017-12-22 MED ORDER — CEFTRIAXONE SODIUM 1 G IJ SOLR: 1.0000 g | Freq: Once | INTRAMUSCULAR | Status: DC

## 2017-12-22 MED ORDER — SODIUM CHLORIDE 0.9 % IV SOLN: 1.0000 g | INTRAVENOUS | Status: DC

## 2017-12-22 MED ORDER — SODIUM CHLORIDE 0.9 % IV SOLN
1.0000 g | Freq: Three times a day (TID) | INTRAVENOUS | Status: DC
  Administered 2017-12-22: 1 g via INTRAVENOUS
  Filled 2017-12-22: qty 1

## 2017-12-22 MED ORDER — ACETAMINOPHEN 325 MG PO TABS: 650.0000 mg | ORAL_TABLET | Freq: Four times a day (QID) | ORAL | Status: DC | PRN

## 2017-12-22 MED ORDER — ACETAMINOPHEN 650 MG RE SUPP: 650.0000 mg | Freq: Four times a day (QID) | RECTAL | Status: DC | PRN

## 2017-12-22 MED ORDER — FUROSEMIDE 40 MG PO TABS
20.0000 mg | ORAL_TABLET | Freq: Three times a day (TID) | ORAL | Status: DC
  Administered 2017-12-22 – 2017-12-23 (×2): 20 mg via ORAL
  Filled 2017-12-22 (×2): qty 1

## 2017-12-22 MED ORDER — PANTOPRAZOLE SODIUM 40 MG PO TBEC
40.0000 mg | DELAYED_RELEASE_TABLET | Freq: Two times a day (BID) | ORAL | Status: DC
  Administered 2017-12-22 – 2017-12-23 (×2): 40 mg via ORAL
  Filled 2017-12-22 (×2): qty 1

## 2017-12-22 MED ORDER — ONDANSETRON HCL 4 MG PO TABS: 4.0000 mg | ORAL_TABLET | Freq: Four times a day (QID) | ORAL | Status: DC | PRN

## 2017-12-22 MED ORDER — ONDANSETRON HCL 4 MG/2ML IJ SOLN: 4.0000 mg | Freq: Four times a day (QID) | INTRAMUSCULAR | Status: DC | PRN

## 2017-12-22 MED ORDER — FLUCONAZOLE 100 MG PO TABS
100.0000 mg | ORAL_TABLET | Freq: Every day | ORAL | Status: DC
  Administered 2017-12-23: 100 mg via ORAL
  Filled 2017-12-22: qty 1

## 2017-12-22 MED ORDER — LATANOPROST 0.005 % OP SOLN
1.0000 [drp] | Freq: Every day | OPHTHALMIC | Status: DC
  Administered 2017-12-22: 1 [drp] via OPHTHALMIC
  Filled 2017-12-22 (×2): qty 2.5

## 2017-12-22 MED ORDER — SODIUM CHLORIDE 0.9 % IV SOLN: 500.0000 mg | INTRAVENOUS | Status: DC

## 2017-12-22 MED ORDER — FERROUS SULFATE 325 (65 FE) MG PO TABS
325.0000 mg | ORAL_TABLET | Freq: Two times a day (BID) | ORAL | Status: DC
  Administered 2017-12-22 – 2017-12-23 (×2): 325 mg via ORAL
  Filled 2017-12-22 (×2): qty 1

## 2017-12-22 MED ORDER — VANCOMYCIN HCL IN DEXTROSE 1-5 GM/200ML-% IV SOLN
1000.0000 mg | INTRAVENOUS | Status: DC
  Administered 2017-12-22: 1000 mg via INTRAVENOUS
  Filled 2017-12-22: qty 200

## 2017-12-22 MED ORDER — TAMSULOSIN HCL 0.4 MG PO CAPS
0.4000 mg | ORAL_CAPSULE | Freq: Every day | ORAL | Status: DC
  Administered 2017-12-22: 0.4 mg via ORAL
  Filled 2017-12-22: qty 1

## 2017-12-22 MED ORDER — FLUCONAZOLE IN SODIUM CHLORIDE 200-0.9 MG/100ML-% IV SOLN
200.0000 mg | Freq: Once | INTRAVENOUS | Status: AC
  Administered 2017-12-22: 200 mg via INTRAVENOUS
  Filled 2017-12-22 (×2): qty 100

## 2017-12-22 NOTE — Progress Notes (Signed)
Pharmacy Antibiotic Note  Austin Jackson is a 84 y.o. male admitted on 12/22/2017 with pneumonia.  Pharmacy has been consulted for Vancomycin dosing.  Also on Cefepime  Plan: Vancomycin 1gm IV every 48 hours.  Goal trough 15-20 mcg/mL.  Change Cefepime to 1gm IV every 24 hours fo renal function. Monitor labs, micro and vitals.   Height: 5\' 8"  (172.7 cm) Weight: 140 lb (63.5 kg) IBW/kg (Calculated) : 68.4  Temp (24hrs), Avg:97.8 F (36.6 C), Min:97.8 F (36.6 C), Max:97.8 F (36.6 C)  Recent Labs  Lab 12/22/17 1450 12/22/17 1729  WBC 3.8*  --   CREATININE 2.29*  --   LATICACIDVEN 1.5 1.5    Estimated Creatinine Clearance: 16.9 mL/min (A) (by C-G formula based on SCr of 2.29 mg/dL (H)).    Allergies  Allergen Reactions  . Codeine Nausea And Vomiting  . Lisinopril Cough  . Ultram [Tramadol Hcl] Other (See Comments)    Confusion     Antimicrobials this admission: Vanc 6/15 >>  Cefepime 6/15 >>   Dose adjustments this admission: n/a   Microbiology results:  BCx:  6/15 UCx: pending   Sputum:    MRSA PCR:   Thank you for allowing pharmacy to be a part of this patient's care.  Pricilla Larsson 12/22/2017 7:15 PM

## 2017-12-22 NOTE — H&P (Addendum)
History and Physical    RAMARI BRAY JKD:[id number] DOB: Oct 10, 1919 DOA: 12/22/2017  PCP: Neale Burly, MD Consultants:  Tresa Moore - urology; Michail Sermon - GI Patient coming from:  Home - lives with wife; NOK: Son (ER physician in [place], [place]), [phone]  Chief Complaint:  Abdominal pain  HPI: Austin Jackson is a 84 y.o. male with medical history significant of macular degeneration/glaucoma with essential blindness; hypothyroidism; HTN; stage 3 CKD; afib not on AC; and bladder cancer with solitary kidney/left ureteral stent/hydronephrosis presenting with abdominal pain, dysuria, frequency.  His son received a call this afternoon - he was unable to void, significant upper/left-sided abdominal pain.  He was able to void in the ER and the discomfort has gone away for the most part.  He always has pain on the left due to hydronephrosis from the solitary kidney and stent.  He also has chronic hematuria.  Hgb after prior GI bleed was 10.3 at the time of discharge on 4/16.  No more known GI issues since last hospitalization.  He had acute onset of upper abdominal pain today, but now he reports that it started about 1 week ago.  Large BMs/gas intermixed with diarrhea.  +dysuria x 1 month - not unusual for him.  +chronic seb K on left lateral eye.  It developed some edema and surrounding erythema - new in the last week.  His son also reports that his creatinine was up from baseline of 1.6 to 3 last week - his Lasix dose was decreased.   ED Course:  Tachycardia, urinary symptoms.  No fever.  Stable BP.  UA - ?infection.  D/w Dr. Alinda Money - + yeast, consider treating.  Has stent in place, solitary kidney.  Urine culture is pending.  L basilar opacity.  Troponin 0.03.  Lactate ok.  Creatinine increased.  Urine culture pending.  Dr. Alinda Money ok with admitting here.  Review of Systems: As per HPI; otherwise review of systems reviewed and negative.   Ambulatory Status:  Ambulates with a walker  Past  Medical History:  Diagnosis Date  . Bilateral lower extremity edema   . Bladder cancer ([place])   . CHF (congestive heart failure) (HCC)    mild  . Chronic atrial fibrillation ([place])    dx 2002--  s/p cardioversion approx 2006 or 2007-- per pt last cardiologist visit at that time-- currently be followed by pcp dr Theodis Blaze in [place]  . CKD (chronic kidney disease), stage III ([place])   . Dysrhythmia    atrial fibrillation ; son [name] states that patient was managed on blood thinner for years for Afbib until cardio decided due to his advanced age to d/c blood dinner , no ASA can be used d/t history of GI bleed so patient is just managed with metoprol. today patient  just reports swelling in bilat lower extremeties for which he was started on lasix and has now seen some improvement   . Full dentures   . GERD (gastroesophageal reflux disease)   . Glaucoma, both eyes   . Gross hematuria    intermittent  . History of BPH   . History of GI bleed    2013--  per pt unable to find reason but had been on blood thinner,  was transfusion's  . History of kidney stones   . Hypertension   . Hypothyroidism due to amiodarone   . Iron deficiency anemia   . Macular degeneration of both eyes   . OA (osteoarthritis)   . PONV (  postoperative nausea and vomiting)   . PUD (peptic ulcer disease) 10/2017   with bleeding ulcer  . Renal mass, right    renal pelvis    Past Surgical History:  Procedure Laterality Date  . CARDIOVERSION  2002  . COLONOSCOPY  yrs ago  . CYSTOSCOPY W/ URETERAL STENT PLACEMENT Left 08/01/2017   Procedure: CYSTOSCOPY WITH STENT REPLACEMENT;  Surgeon: [name], MD;  Location: [facility];  Service: Urology;  Laterality: Left;  . CYSTOSCOPY WITH RETROGRADE PYELOGRAM, URETEROSCOPY AND STENT PLACEMENT Left 07/19/2016   Procedure: CYSTOSCOPY WITH RETROGRADE PYELOGRAM,  AND STENT PLACEMENT;  Surgeon: [name], MD;  Location: [facility];  Service: Urology;  Laterality: Left;  . CYSTOSCOPY  WITH RETROGRADE PYELOGRAM, URETEROSCOPY AND STENT PLACEMENT Left 01/24/2017   Procedure: CYSTOSCOPY WITH RETROGRADE PYELOGRAM, AND STENT REPLACEMENT;  Surgeon: [name], MD;  Location: [facility];  Service: Urology;  Laterality: Left;  . CYSTOSCOPY/RETROGRADE/URETEROSCOPY Bilateral 09/15/2016   Procedure: CYSTOSCOPY/ RETROGRADE/DIAGNOSITC URETEROSCOPY/URETERAL STENT EXCHANGE;  Surgeon: [name], MD;  Location: [facility];  Service: Urology;  Laterality: Bilateral;  . ESOPHAGOGASTRODUODENOSCOPY (EGD) WITH PROPOFOL N/A 10/21/2017   Procedure: ESOPHAGOGASTRODUODENOSCOPY (EGD) WITH PROPOFOL;  Surgeon: Wilford Corner, MD;  Location: [facility];  Service: Endoscopy;  Laterality: N/A;  . EXTRACORPOREAL SHOCK WAVE LITHOTRIPSY  1970's  . EYE SURGERY Left    for glaucoma  . EYE SURGERY Bilateral    cataract extraction with IOL  . INGUINAL HERNIA REPAIR Bilateral 2007 and 1980's  . ROBOT ASSITED LAPAROSCOPIC NEPHROURETERECTOMY Right 10/12/2016   Procedure: XI ROBOT ASSITED LAPAROSCOPIC NEPHROURETERECTOMY;  Surgeon: [name], MD;  Location: [facility];  Service: Urology;  Laterality: Right;  . TRANSURETHRAL RESECTION OF BLADDER TUMOR Right 07/12/2016   Procedure: TRANSURETHRAL RESECTION OF BLADDER TUMOR (TURBT)/ BILATERAL RETROGRADE PYLEOGRAM/ RIGHT DIAGNOSTIC URETEROSCOPY;  Surgeon: [name], MD;  Location[facility];  Service: Urology;  Laterality: Right;  . TRANSURETHRAL RESECTION OF PROSTATE  2002    Social History   Socioeconomic History  . Marital status: Married    Spouse name: Not on file  . Number of children: Not on file  . Years of education: Not on file  . Highest education level: Not on file  Occupational History  . Not on file  Social Needs  . Financial resource strain: Not on file  . Food insecurity:    Worry: Not on file    Inability: Not on file  . Transportation needs:    Medical: Not on file    Non-medical: Not on file  Tobacco Use  . Smoking  status: Former Smoker    Packs/day: 2.00    Years: 30.00    Pack years: 60.00    Types: Cigarettes    Last attempt to quit: 07/07/1984    Years since quitting: 33.4  . Smokeless tobacco: Never Used  Substance and Sexual Activity  . Alcohol use: Yes    Comment: occasional beer  . Drug use: No  . Sexual activity: Not on file  Lifestyle  . Physical activity:    Days per week: Not on file    Minutes per session: Not on file  . Stress: Not on file  Relationships  . Social connections:    Talks on phone: Not on file    Gets together: Not on file    Attends religious service: Not on file    Active member of club or organization: Not on file    Attends meetings of clubs or organizations: Not on file  Relationship status: Not on file  . Intimate partner violence:    Fear of current or ex partner: Not on file    Emotionally abused: Not on file    Physically abused: Not on file    Forced sexual activity: Not on file  Other Topics Concern  . Not on file  Social History Narrative  . Not on file    Allergies  Allergen Reactions  . Codeine Nausea And Vomiting  . Lisinopril Cough  . Ultram [Tramadol Hcl] Other (See Comments)    Confusion     Family History  Problem Relation Age of Onset  . Stroke Mother   . Heart disease Father   . Heart disease Brother   . Heart disease Paternal Grandfather   . Heart disease Brother   . Kidney cancer Neg Hx   . Diabetes Mellitus II Neg Hx   . CAD Neg Hx     Prior to Admission medications   Medication Sig Start Date End Date Taking? Authorizing Provider  acetaminophen (TYLENOL) 500 MG tablet Take 1,000 mg by mouth every 12 (twelve) hours. 6 am and 6 pm   Yes [provider]  ALPHAGAN P 0.1 % SOLN Place 1 drop into both eyes 3 (three) times daily.  07/01/16  Yes [provider]  Ferrous Sulfate (IRON) 325 (65 Fe) MG TABS Take 325 mg by mouth 2 (two) times daily.    Yes [provider]  furosemide (LASIX) 20 MG  tablet Take 20 mg by mouth 3 (three) times daily.    Yes [provider]  latanoprost (XALATAN) 0.005 % ophthalmic solution Place 1 drop into both eyes at bedtime.    Yes [provider]  levothyroxine (SYNTHROID, LEVOTHROID) 50 MCG tablet Take 75 mcg by mouth daily before breakfast.    Yes [provider]  metoprolol tartrate (LOPRESSOR) 25 MG tablet Take 12.5 mg by mouth 3 (three) times daily.    Yes [provider]  pantoprazole (PROTONIX) 40 MG tablet Take 1 tablet (40 mg total) by mouth 2 (two) times daily. 10/23/17  Yes Charlynne Cousins, MD  tamsulosin (FLOMAX) 0.4 MG CAPS capsule Take 0.4 mg by mouth at bedtime.    Yes [provider]  lisinopril (PRINIVIL,ZESTRIL) 2.5 MG tablet TAKE 1 TABLET (2.5MG  BY MOUTH ONCE DAILY 10/08/17   [provider]    Physical Exam: Vitals:   12/22/17 1945 12/22/17 2000 12/22/17 2015 12/22/17 2018  BP:    117/77  Pulse: (!) 112 (!) 47 (!) 109 96  Resp: 18 16 17  (!) 22  Temp:      TempSrc:      SpO2: 100% 99% 98% 99%  Weight:      Height:         General:  Appears calm and comfortable and is NAD Eyes:  Does not track with his eyes and is unable to fix; large chronic skin lesion adjacent to left eye with underlying apparent mild hematoma that is TTP and mild surrounding erythema without warmth ENT:  Hard of hearing, normal lips & tongue, mmm; edentulous Neck:  no LAD, masses or thyromegaly Cardiovascular:  Afib, no r/g, 8-1/1 systolic murmur. Trace LE edema.  Respiratory:   CTA bilaterally with no wheezes/rales/rhonchi.  Normal respiratory effort. Abdomen:  soft, mildly diffusely tender, ND, NABS Skin:  no rash or induration seen on limited exam other than as noted above Musculoskeletal:  grossly normal tone BUE/BLE, good ROM, no bony abnormality Psychiatric:  grossly  normal mood and affect, speech fluent and appropriate, AOx3 Neurologic:  CN 2-12 grossly intact, moves all extremities in  coordinated fashion, sensation intact    Radiological Exams on Admission: Ct Abdomen Pelvis Wo Contrast  Result Date: 12/22/2017 CLINICAL DATA:  Abdominal pain. Dysuria. Prostate cancer. Solitary kidney. EXAM: CT ABDOMEN AND PELVIS WITHOUT CONTRAST TECHNIQUE: Multidetector CT imaging of the abdomen and pelvis was performed following the standard protocol without IV contrast. COMPARISON:  10/20/2017 FINDINGS: Lower chest: Interstitial accentuation at the lung bases is likely within normal variation in this age group. Moderate cardiomegaly with coronary artery atherosclerosis. Trace right pleural fluid persists. Hepatobiliary: Normal liver. Normal gallbladder, without biliary ductal dilatation. Pancreas: Normal pancreas for age, without duct dilatation. Spleen: Normal in size, without focal abnormality. Adrenals/Urinary Tract: Similar bilateral adrenal nodularity, greater on the left. Right renal absence. Left renal cortical thinning. There is left extrarenal pelvis without overt hydronephrosis. Left ureteric stent originates in the left extrarenal pelvis and terminates in the urinary bladder. No urinary tract calculi. Stomach/Bowel: Proximal gastric underdistention. Extensive colonic diverticulosis. Normal terminal ileum. The appendix is likely identified on 56/2. No surrounding inflammation. Normal small bowel caliber. Vascular/Lymphatic: Advanced aortic and branch vessel atherosclerosis. No abdominopelvic adenopathy. Reproductive: Moderate prostatomegaly with probable TURP defect. Other: No significant free fluid. No free intraperitoneal air. Right inguinal hernia repair. Fat containing ventral abdominal wall hernia. Musculoskeletal: Lumbosacral spondylosis is advanced. A chronic compression deformity at T11 is moderate to severe with mild ventral canal encroachment. S-shaped lumbar spine curvature. IMPRESSION: 1.  No acute process in the abdomen or pelvis. 2. Solitary left kidney with ureteric stent in  place. Left extrarenal pelvis, without overt hydronephrosis and no evidence of ureteric calculi. 3. Small right pleural effusion. 4. Coronary artery atherosclerosis. Aortic Atherosclerosis (ICD10-I70.0). 5. Other incidental findings, as above. Electronically Signed   By: Abigail Miyamoto M.D.   On: 12/22/2017 16:54   Dg Chest 1 View  Result Date: 12/22/2017 CLINICAL DATA:  Abdominal pain. EXAM: CHEST  1 VIEW COMPARISON:  10/02/2017 FINDINGS: There is cardiac enlargement. Aortic atherosclerosis noted. Decreased lung volumes with asymmetric elevation of right hemidiaphragm. Asymmetric airspace opacity within the left base is identified. Right lung relatively clear. IMPRESSION: 1. Left base airspace opacity worrisome for pneumonia. Followup PA and lateral chest X-ray is recommended in 3-4 weeks following trial of antibiotic therapy to ensure resolution and exclude underlying malignancy. 2. Cardiac enlargement and aortic atherosclerosis. Aortic Atherosclerosis (ICD10-I70.0). Electronically Signed   By: Kerby Moors M.D.   On: 12/22/2017 17:11   Dg Chest 2 View  Result Date: 12/22/2017 CLINICAL DATA:  Chest pain EXAM: CHEST - 2 VIEW COMPARISON:  12/22/2017, 10/02/2016 FINDINGS: Cardiomegaly with vascular congestion. Streaky atelectasis at the left base. No pleural effusion. Aortic atherosclerosis. No pneumothorax. IMPRESSION: 1. Streaky atelectasis at the left base.  No focal consolidation. 2. Cardiomegaly with vascular congestion. Electronically Signed   By: Donavan Foil M.D.   On: 12/22/2017 19:14    EKG: Independently reviewed.  Afib with rate 98; nonspecific ST changes with no evidence of acute ischemia   Labs on Admission: I have personally reviewed the available labs and imaging studies at the time of the admission.  Pertinent labs:   Glucose 115 BUN 36/Creatinine 2.29/GFR 26; prior 40/1.63/40 on 4/14 Albumin 2.8 Troponin 0.03 Lactate 1.5 WBC 3.8 Hgb 9.2; prior 10.8 on 4/16 UA: large Hgb,  large ketones, 30 protein; no bacteria, ?50 RBC and WBC; +budding yeast Urine culture pending  Assessment/Plan Principal Problem:  Abdominal pain Active Problems:   Normocytic normochromic anemia   Hypothyroidism   Hypertension   Chronic atrial fibrillation (HCC)   CKD (chronic kidney disease) stage 3, GFR 30-59 ml/min (HCC)   Candidal cystitis and urethritis   Abdominal pain -Patient with known h/o bladder CA and prior GI bleed in 4/19 presenting with acute abdominal pain and urinary retention -Patient was able to void in the ER and had resolution of pain -Suspect that he had bladder spasm or other cause for acute retention with spontaneous resolution -[name] is an ER physician; I offered d/c to home under his care, but he prefers overnight observation to ensure no recurrence of symptoms -He was given a dose of Cefepime in the ER for coverage of PNA, but 2-view CXR confirms that there is no PNA present (patient does not have respiratory s/sx, as well)  Anemia -Mildly worse than at the time of last d/c -Will monitor overnight -Currently low suspicion for recurrent GI bleeding but if Hgb is continuing to fall he may need repeat hemoccult testing  CKD -Slightly worse than baseline, but better than last week according to the patient's son -His Lasix dose was decreased by PCP and this has resulted in improved renal function -Will recheck in AM -Continue home diuretics, no IVF  Candidal cystitis -Urinary retention may be related to fungal elements seen on UA -Urine culture is pending -No further antibiotics, but will treat with antifungal -Recommended treatment appears to be with Diflucan 200 mg x 1 (full dose loading dose) and then 100 mg PO daily for a total of 14 days (based on renal impairment)  HTN -Continue Lopressor -Hold Lisinopril due to renal impairment  Afib -Chronic -Rate is mildly elevated -Continue Lopressor -Not on AC given h/o GI bleeding -Borderline  troponin is likely related to mild demand ischemia, will trend  DVT prophylaxis:  SCDs Code Status:  DNR - confirmed with patient/family Family Communication: Physician son present throughout evaluation  Disposition Plan:  Home once clinically improved Consults called: None  Admission status: It is my clinical opinion that referral for OBSERVATION is reasonable and necessary in this patient based on the above information provided. The aforementioned taken together are felt to place the patient at high risk for further clinical deterioration. However it is anticipated that the patient may be medically stable for discharge from the hospital within 24 to 48 hours.    Karmen Bongo MD [facility]  If note is complete, please contact covering daytime or nighttime physician. [email or web address] Password [facility]  12/22/2017, 8:35 PM

## 2017-12-22 NOTE — ED Provider Notes (Signed)
[facility] EMERGENCY DEPARTMENT Provider Note   CSN: [id number] Arrival date & time: 12/22/17  1433     History   Chief Complaint Chief Complaint  Patient presents with  . Dysuria  . Abdominal Pain    HPI Austin Jackson is a 84 y.o. male.   Dysuria    Abdominal Pain   Associated symptoms include dysuria.  Pt was seen at 1515.  Per pt, c/o gradual onset and persistence of constant generalized abd "pain" for the past week.  Has been associated with dysuria and urinary frequency. Describes the abd pain as "aching."  Denies N/V, no diarrhea, no fevers, no back pain, no rash, no CP/SOB, no black or blood in stools, no hematuria, no testicular pain/swelling.      Past Medical History:  Diagnosis Date  . Bilateral lower extremity edema   . Bladder cancer ([place])   . CHF (congestive heart failure) (HCC)    mild  . Chronic atrial fibrillation ([place])    dx 2002--  s/p cardioversion approx 2006 or 2007-- per pt last cardiologist visit at that time-- currently be followed by pcp dr Theodis Blaze in [place]  . CKD (chronic kidney disease), stage III ([place])   . Dysrhythmia    atrial fibrillation ; son Austin Jackson states that patient was managed on blood thinner for years for Afbib until cardio decided due to his advanced age to d/c blood dinner , no ASA can be used d/t history of GI bleed so patient is just managed with metoprol. today patient  just reports swelling in bilat lower extremeties for which he was started on lasix and has now seen some improvement   . Full dentures   . GERD (gastroesophageal reflux disease)   . Glaucoma, both eyes   . Gross hematuria    intermittent  . History of BPH   . History of GI bleed    2013--  per pt unable to find reason but had been on blood thinner,  was transfusion's  . History of kidney stones   . Hypertension   . Hypothyroidism due to amiodarone   . Iron deficiency anemia   . Macular degeneration of both eyes   . OA (osteoarthritis)   . PONV  (postoperative nausea and vomiting)   . PUD (peptic ulcer disease) 10/2017   with bleeding ulcer  . Renal mass, right    renal pelvis    Patient Active Problem List   Diagnosis Date Noted  . Melena 10/20/2017  . Urinary tract infection in male 05/08/2017  . Chronic renal disease, stage III ([place]) 05/08/2017  . Glaucoma, both eyes 05/08/2017  . Chronic atrial fibrillation ([place]) 05/08/2017  . Altered mental status 05/08/2017  . Acute metabolic encephalopathy 66/12/3014  . Acute pyelonephritis 05/08/2017  . CKD (chronic kidney disease) stage 3, GFR 30-59 ml/min (HCC) 05/08/2017  . Cellulitis of right leg 05/08/2017  . Urothelial carcinoma of kidney, right ([place]) 10/12/2016  . Acute renal failure (ARF) ([place]) 07/19/2016  . Hydronephrosis 07/19/2016  . Normocytic normochromic anemia 07/19/2016  . Hypothyroidism 07/19/2016  . Hypertension 07/19/2016  . Acute renal failure ([place]) 07/19/2016  . SIRS (systemic inflammatory response syndrome) ([place]) 07/19/2016  . Hyperkalemia 05-Jan-202018  . Bladder cancer ([place]) 07/12/2016    Past Surgical History:  Procedure Laterality Date  . CARDIOVERSION  2002  . COLONOSCOPY  yrs ago  . CYSTOSCOPY W/ URETERAL STENT PLACEMENT Left 08/01/2017   Procedure: CYSTOSCOPY WITH STENT REPLACEMENT;  Surgeon: [name], MD;  Location:  [facility];  Service: Urology;  Laterality: Left;  . CYSTOSCOPY WITH RETROGRADE PYELOGRAM, URETEROSCOPY AND STENT PLACEMENT Left 07/19/2016   Procedure: CYSTOSCOPY WITH RETROGRADE PYELOGRAM,  AND STENT PLACEMENT;  Surgeon: [name], MD;  Location: [facility];  Service: Urology;  Laterality: Left;  . CYSTOSCOPY WITH RETROGRADE PYELOGRAM, URETEROSCOPY AND STENT PLACEMENT Left 01/24/2017   Procedure: CYSTOSCOPY WITH RETROGRADE PYELOGRAM, AND STENT REPLACEMENT;  Surgeon: [name], MD;  Location: [facility];  Service: Urology;  Laterality: Left;  . CYSTOSCOPY/RETROGRADE/URETEROSCOPY Bilateral 09/15/2016   Procedure: CYSTOSCOPY/  RETROGRADE/DIAGNOSITC URETEROSCOPY/URETERAL STENT EXCHANGE;  Surgeon: [name], MD;  Location: [facility];  Service: Urology;  Laterality: Bilateral;  . ESOPHAGOGASTRODUODENOSCOPY (EGD) WITH PROPOFOL N/A 10/21/2017   Procedure: ESOPHAGOGASTRODUODENOSCOPY (EGD) WITH PROPOFOL;  Surgeon: Wilford Corner, MD;  Location: [facility];  Service: Endoscopy;  Laterality: N/A;  . EXTRACORPOREAL SHOCK WAVE LITHOTRIPSY  1970's  . EYE SURGERY Left    for glaucoma  . EYE SURGERY Bilateral    cataract extraction with IOL  . INGUINAL HERNIA REPAIR Bilateral 2007 and 1980's  . ROBOT ASSITED LAPAROSCOPIC NEPHROURETERECTOMY Right 10/12/2016   Procedure: XI ROBOT ASSITED LAPAROSCOPIC NEPHROURETERECTOMY;  Surgeon: [name], MD;  Location: [facility];  Service: Urology;  Laterality: Right;  . TRANSURETHRAL RESECTION OF BLADDER TUMOR Right 07/12/2016   Procedure: TRANSURETHRAL RESECTION OF BLADDER TUMOR (TURBT)/ BILATERAL RETROGRADE PYLEOGRAM/ RIGHT DIAGNOSTIC URETEROSCOPY;  Surgeon: [name], MD;  Location: [facility];  Service: Urology;  Laterality: Right;  . TRANSURETHRAL RESECTION OF PROSTATE  2002        Home Medications    Prior to Admission medications   Medication Sig Start Date End Date Taking? Authorizing Provider  acetaminophen (TYLENOL) 500 MG tablet Take 1,000 mg by mouth every 12 (twelve) hours. 6 am and 6 pm    [provider]  ALPHAGAN P 0.1 % SOLN Place 1 drop into both eyes 3 (three) times daily.  07/01/16   [provider]  Ferrous Sulfate (IRON) 325 (65 Fe) MG TABS Take 325 mg by mouth 2 (two) times daily.     [provider]  fluconazole (DIFLUCAN) 100 MG tablet Take 1 tablet (100 mg total) by mouth daily. 10/24/17   Charlynne Cousins, MD  furosemide (LASIX) 20 MG tablet Take 20 mg by mouth 3 (three) times daily.     [provider]  latanoprost (XALATAN) 0.005 % ophthalmic solution Place 1 drop into both eyes at bedtime.      [provider]  levothyroxine (SYNTHROID, LEVOTHROID) 50 MCG tablet Take 75 mcg by mouth daily before breakfast.     [provider]  lisinopril (PRINIVIL,ZESTRIL) 2.5 MG tablet TAKE 1 TABLET (2.5MG  BY MOUTH ONCE DAILY 10/08/17   [provider]  metoprolol tartrate (LOPRESSOR) 25 MG tablet Take 12.5 mg by mouth 3 (three) times daily.     [provider]  pantoprazole (PROTONIX) 40 MG tablet Take 1 tablet (40 mg total) by mouth 2 (two) times daily. 10/23/17   Charlynne Cousins, MD  tamsulosin (FLOMAX) 0.4 MG CAPS capsule Take 0.4 mg by mouth at bedtime.     [provider]    Family History Family History  Problem Relation Age of Onset  . Stroke Mother   . Heart disease Father   . Heart disease Brother   . Heart disease Paternal Grandfather   . Heart disease Brother   . Kidney cancer Neg Hx   . Diabetes Mellitus II Neg Hx   .  CAD Neg Hx     Social History Social History   Tobacco Use  . Smoking status: Former Smoker    Packs/day: 2.00    Years: 30.00    Pack years: 60.00    Types: Cigarettes    Last attempt to quit: 07/07/1984    Years since quitting: 33.4  . Smokeless tobacco: Never Used  Substance Use Topics  . Alcohol use: Yes    Comment: occasional beer  . Drug use: No     Allergies   Codeine; Lisinopril; and Ultram [tramadol hcl]   Review of Systems Review of Systems  Gastrointestinal: Positive for abdominal pain.  Genitourinary: Positive for dysuria.  ROS: Statement: All systems negative except as marked or noted in the HPI; Constitutional: Negative for fever and chills. ; ; Eyes: Negative for eye pain, redness and discharge. ; ; ENMT: Negative for ear pain, hoarseness, nasal congestion, sinus pressure and sore throat. ; ; Cardiovascular: Negative for chest pain, palpitations, diaphoresis, dyspnea and peripheral edema. ; ; Respiratory: Negative for cough, wheezing and stridor. ; ; Gastrointestinal: +abd pain. Negative  for nausea, vomiting, diarrhea, blood in stool, hematemesis, jaundice and rectal bleeding. . ; ; Genitourinary: +dysuria, urinary frequency.  Negative for flank pain and hematuria. ; ; Genital:  No penile drainage or rash, no testicular pain or swelling, no scrotal rash or swelling. ;; Musculoskeletal: Negative for back pain and neck pain. Negative for swelling and trauma.; ; Skin: Negative for pruritus, rash, abrasions, blisters, bruising and skin lesion.; ; Neuro: Negative for headache, lightheadedness and neck stiffness. Negative for weakness, altered level of consciousness, altered mental status, extremity weakness, paresthesias, involuntary movement, seizure and syncope.        Physical Exam Updated Vital Signs BP 127/67 (BP Location: Left Arm)   Pulse (!) 104   Temp 97.8 F (36.6 C) (Oral)   Resp 16   Ht 5\' 8"  (1.727 m)   Wt 63.5 kg (140 lb)   SpO2 98%   BMI 21.29 kg/m   Physical Exam 1520: Physical examination:  Nursing notes reviewed; Vital signs and O2 SAT reviewed;  Constitutional: Well developed, Well nourished, In no acute distress; Head:  Normocephalic, atraumatic; Eyes: EOMI, PERRL, No scleral icterus; ENMT: Mouth and pharynx normal, Mucous membranes dry; Neck: Supple, Full range of motion, No lymphadenopathy; Cardiovascular: Irregular rate and rhythm, No gallop; Respiratory: Breath sounds clear & equal bilaterally, No wheezes.  Speaking full sentences with ease, Normal respiratory effort/excursion; Chest: Nontender, Movement normal; Abdomen: Soft, +diffuse tenderness to palp. Nondistended, Normal bowel sounds; Genitourinary: No CVA tenderness; Extremities: Peripheral pulses normal, No tenderness, +1 RLE edema, with calf asymmetry that pt states is baseline for him..; Neuro: AA&Ox3, rambling historian. Major CN grossly intact.  Speech clear. No gross focal motor or sensory deficits in extremities.; Skin: Color normal, Warm, Dry.   ED Treatments / Results  Labs (all labs  ordered are listed, but only abnormal results are displayed)   EKG EKG Interpretation  Date/Time:  Saturday December 22 2017 14:51:14 EDT Ventricular Rate:  98 PR Interval:    QRS Duration: 113 QT Interval:  377 QTC Calculation: 482 R Axis:   120 Text Interpretation:  Atrial fibrillation Ventricular premature complex Incomplete right bundle branch block Lateral infarct, old Baseline wander When compared with ECG of 10/22/2017 No significant change was found Confirmed by Francine Graven [id number]) on 12/22/2017 3:26:14 PM   Radiology   Procedures Procedures (including critical care time)  Medications Ordered in ED Medications - No  data to display   Initial Impression / Assessment and Plan / ED Course  I have reviewed the triage vital signs and the nursing notes.  Pertinent labs & imaging results that were available during my care of the patient were reviewed by me and considered in my medical decision making (see chart for details).  MDM Reviewed: previous chart, vitals and nursing note Reviewed previous: labs and ECG Interpretation: labs, ECG, x-ray and CT scan   Results for orders placed or performed during the hospital encounter of 12/22/17  Comprehensive metabolic panel  Result Value Ref Range   Sodium 134 (L) 135 - 145 mmol/L   Potassium 3.9 3.5 - 5.1 mmol/L   Chloride 103 101 - 111 mmol/L   CO2 24 22 - 32 mmol/L   Glucose, Bld 115 (H) 65 - 99 mg/dL   BUN 36 (H) 6 - 20 mg/dL   Creatinine, Ser 2.29 (H) 0.61 - 1.24 mg/dL   Calcium 8.4 (L) 8.9 - 10.3 mg/dL   Total Protein 7.7 6.5 - 8.1 g/dL   Albumin 2.8 (L) 3.5 - 5.0 g/dL   AST 15 15 - 41 U/L   ALT 9 (L) 17 - 63 U/L   Alkaline Phosphatase 53 38 - 126 U/L   Total Bilirubin 0.7 0.3 - 1.2 mg/dL   GFR calc non Af Amer 23 (L) >60 mL/min   GFR calc Af Amer 26 (L) >60 mL/min   Anion gap 7 5 - 15  Lipase, blood  Result Value Ref Range   Lipase 21 11 - 51 U/L  CBC with Differential  Result Value Ref Range   WBC 3.8 (L)  4.0 - 10.5 K/uL   RBC 2.92 (L) 4.22 - 5.81 MIL/uL   Hemoglobin 9.2 (L) 13.0 - 17.0 g/dL   HCT 29.7 (L) 39.0 - 52.0 %   MCV 101.7 (H) 78.0 - 100.0 fL   MCH 31.5 26.0 - 34.0 pg   MCHC 31.0 30.0 - 36.0 g/dL   RDW 17.1 (H) 11.5 - 15.5 %   Platelets 171 150 - 400 K/uL   Neutrophils Relative % 51 %   Neutro Abs 1.9 1.7 - 7.7 K/uL   Lymphocytes Relative 33 %   Lymphs Abs 1.3 0.7 - 4.0 K/uL   Monocytes Relative 14 %   Monocytes Absolute 0.6 0.1 - 1.0 K/uL   Eosinophils Relative 1 %   Eosinophils Absolute 0.1 0.0 - 0.7 K/uL   Basophils Relative 1 %   Basophils Absolute 0.0 0.0 - 0.1 K/uL  Lactic acid, plasma  Result Value Ref Range   Lactic Acid, Venous 1.5 0.5 - 1.9 mmol/L  Urinalysis, Routine w reflex microscopic  Result Value Ref Range   Color, Urine YELLOW YELLOW   APPearance CLOUDY (A) CLEAR   Specific Gravity, Urine 1.009 1.005 - 1.030   pH 6.0 5.0 - 8.0   Glucose, UA NEGATIVE NEGATIVE mg/dL   Hgb urine dipstick LARGE (A) NEGATIVE   Bilirubin Urine NEGATIVE NEGATIVE   Ketones, ur NEGATIVE NEGATIVE mg/dL   Protein, ur 30 (A) NEGATIVE mg/dL   Nitrite NEGATIVE NEGATIVE   Leukocytes, UA LARGE (A) NEGATIVE   RBC / HPF >50 (H) 0 - 5 RBC/hpf   WBC, UA >50 (H) 0 - 5 WBC/hpf   Bacteria, UA NONE SEEN NONE SEEN   WBC Clumps PRESENT    Budding Yeast PRESENT   Troponin I  Result Value Ref Range   Troponin I 0.03 (HH) <0.03 ng/mL   Ct Abdomen  Pelvis Wo Contrast Result Date: 12/22/2017 CLINICAL DATA:  Abdominal pain. Dysuria. Prostate cancer. Solitary kidney. EXAM: CT ABDOMEN AND PELVIS WITHOUT CONTRAST TECHNIQUE: Multidetector CT imaging of the abdomen and pelvis was performed following the standard protocol without IV contrast. COMPARISON:  10/20/2017 FINDINGS: Lower chest: Interstitial accentuation at the lung bases is likely within normal variation in this age group. Moderate cardiomegaly with coronary artery atherosclerosis. Trace right pleural fluid persists. Hepatobiliary: Normal  liver. Normal gallbladder, without biliary ductal dilatation. Pancreas: Normal pancreas for age, without duct dilatation. Spleen: Normal in size, without focal abnormality. Adrenals/Urinary Tract: Similar bilateral adrenal nodularity, greater on the left. Right renal absence. Left renal cortical thinning. There is left extrarenal pelvis without overt hydronephrosis. Left ureteric stent originates in the left extrarenal pelvis and terminates in the urinary bladder. No urinary tract calculi. Stomach/Bowel: Proximal gastric underdistention. Extensive colonic diverticulosis. Normal terminal ileum. The appendix is likely identified on 56/2. No surrounding inflammation. Normal small bowel caliber. Vascular/Lymphatic: Advanced aortic and branch vessel atherosclerosis. No abdominopelvic adenopathy. Reproductive: Moderate prostatomegaly with probable TURP defect. Other: No significant free fluid. No free intraperitoneal air. Right inguinal hernia repair. Fat containing ventral abdominal wall hernia. Musculoskeletal: Lumbosacral spondylosis is advanced. A chronic compression deformity at T11 is moderate to severe with mild ventral canal encroachment. S-shaped lumbar spine curvature. IMPRESSION: 1.  No acute process in the abdomen or pelvis. 2. Solitary left kidney with ureteric stent in place. Left extrarenal pelvis, without overt hydronephrosis and no evidence of ureteric calculi. 3. Small right pleural effusion. 4. Coronary artery atherosclerosis. Aortic Atherosclerosis (ICD10-I70.0). 5. Other incidental findings, as above. Electronically Signed   By: Abigail Miyamoto M.D.   On: 12/22/2017 16:54   Dg Chest 1 View Result Date: 12/22/2017 CLINICAL DATA:  Abdominal pain. EXAM: CHEST  1 VIEW COMPARISON:  10/02/2017 FINDINGS: There is cardiac enlargement. Aortic atherosclerosis noted. Decreased lung volumes with asymmetric elevation of right hemidiaphragm. Asymmetric airspace opacity within the left base is identified. Right lung  relatively clear. IMPRESSION: 1. Left base airspace opacity worrisome for pneumonia. Followup PA and lateral chest X-ray is recommended in 3-4 weeks following trial of antibiotic therapy to ensure resolution and exclude underlying malignancy. 2. Cardiac enlargement and aortic atherosclerosis. Aortic Atherosclerosis (ICD10-I70.0). Electronically Signed   By: Kerby Moors M.D.   On: 12/22/2017 17:11    Results for TYUS, KALLAM (MRN [id number]) as of 12/22/2017 17:49  Ref. Range 05/09/2017 05:05 07/30/2017 14:37 10/20/2017 17:43 10/21/2017 16:43 12/22/2017 14:50  BUN Latest Ref Range: 6 - 20 mg/dL 31 (H) 37 (H) 48 (H) 40 (H) 36 (H)  Creatinine Latest Ref Range: 0.61 - 1.24 mg/dL 1.51 (H) 1.64 (H) 1.77 (H) 1.63 (H) 2.29 (H)     1755:  CXR with infiltrate and UA with possible UTI (UC is pending); will tx abx. Ureteral stent in place.  T/C returned from [facility] Dr. Alinda Money, case discussed, including:  HPI, pertinent PM/SHx, VS/PE, dx testing, ED course and treatment: stent is in place and Udip often has RBC/WBC d/t same, UC appropriate, consider anti-fungal tx for yeast seen on Udip, no acute surgical issue at this time, medical management only needed at this time, OK to stay at [facility].   1825:  T/C returned from [facility]. [name], case discussed, including:  HPI, pertinent PM/SHx, VS/PE, dx testing, ED course and treatment:  Agreeable to admit, requests to obtain 2 view CXR to f/u [facility] above.      Final Clinical Impressions(s) / ED Diagnoses   Final  diagnoses:  None    ED Discharge Orders    None       Francine Graven, DO 12/27/17 1603

## 2017-12-22 NOTE — ED Notes (Signed)
Patient denies pain and is resting comfortably.  

## 2017-12-22 NOTE — ED Notes (Signed)
Patient transported to X-ray 

## 2017-12-22 NOTE — ED Triage Notes (Signed)
Per EMS, "patient has abdominal pain, we don't know much else."   Per patient lower abdominal pain that started yesterday with painful urination, and urgency. Patient states history of prostate cancer.

## 2017-12-22 NOTE — ED Notes (Signed)
Pt was complaining of stomach pain and requested to urinate and have BM. Pt placed on bedpan. Unable to have BM however passed gas and immediatly felt better.

## 2017-12-23 DIAGNOSIS — N183 Chronic kidney disease, stage 3 (moderate): Secondary | ICD-10-CM

## 2017-12-23 DIAGNOSIS — B3741 Candidal cystitis and urethritis: Secondary | ICD-10-CM | POA: Diagnosis not present

## 2017-12-23 DIAGNOSIS — R300 Dysuria: Secondary | ICD-10-CM | POA: Diagnosis not present

## 2017-12-23 DIAGNOSIS — I10 Essential (primary) hypertension: Secondary | ICD-10-CM

## 2017-12-23 DIAGNOSIS — I482 Chronic atrial fibrillation: Secondary | ICD-10-CM | POA: Diagnosis not present

## 2017-12-23 DIAGNOSIS — R102 Pelvic and perineal pain: Secondary | ICD-10-CM | POA: Diagnosis not present

## 2017-12-23 DIAGNOSIS — E039 Hypothyroidism, unspecified: Secondary | ICD-10-CM | POA: Diagnosis not present

## 2017-12-23 LAB — BASIC METABOLIC PANEL
ANION GAP: 9 (ref 5–15)
BUN: 34 mg/dL — ABNORMAL HIGH (ref 6–20)
CALCIUM: 8.7 mg/dL — AB (ref 8.9–10.3)
CO2: 22 mmol/L (ref 22–32)
Chloride: 108 mmol/L (ref 101–111)
Creatinine, Ser: 2.04 mg/dL — ABNORMAL HIGH (ref 0.61–1.24)
GFR, EST AFRICAN AMERICAN: 30 mL/min — AB (ref >60)
GFR, EST NON AFRICAN AMERICAN: 26 mL/min — AB (ref >60)
GLUCOSE: 97 mg/dL (ref 65–99)
POTASSIUM: 4.1 mmol/L (ref 3.5–5.1)
Sodium: 139 mmol/L (ref 135–145)

## 2017-12-23 LAB — URINE CULTURE: Culture: NO GROWTH

## 2017-12-23 LAB — CBC
HEMATOCRIT: 29.6 % — AB (ref 39.0–52.0)
HEMOGLOBIN: 9.3 g/dL — AB (ref 13.0–17.0)
MCH: 31.7 pg (ref 26.0–34.0)
MCHC: 31.4 g/dL (ref 30.0–36.0)
MCV: 101 fL — ABNORMAL HIGH (ref 78.0–100.0)
Platelets: 163 10*3/uL (ref 150–400)
RBC: 2.93 MIL/uL — AB (ref 4.22–5.81)
RDW: 17.2 % — ABNORMAL HIGH (ref 11.5–15.5)
WBC: 3.6 10*3/uL — AB (ref 4.0–10.5)

## 2017-12-23 LAB — TROPONIN I
Troponin I: 0.03 ng/mL (ref <0.03)
Troponin I: 0.04 ng/mL (ref <0.03)

## 2017-12-23 MED ORDER — FLUCONAZOLE 100 MG PO TABS: 100.0000 mg | ORAL_TABLET | Freq: Every day | ORAL | 0 refills | Status: AC

## 2017-12-23 MED ORDER — FUROSEMIDE 20 MG PO TABS: 20.0000 mg | ORAL_TABLET | Freq: Two times a day (BID) | ORAL | Status: AC

## 2017-12-23 NOTE — Progress Notes (Signed)
Discharge instructions gone over with patient and son, verbalized understanding. IV removed, patient tolerated procedure well. Printed prescription given to patient.

## 2017-12-23 NOTE — Discharge Summary (Signed)
Physician Discharge Summary  Austin Jackson YWV:[id number] DOB: [birth year] DOA: 12/22/2017  PCP: Austin Burly, MD  Admit date: 12/22/2017 Discharge date: 12/23/2017  Time spent: 35 minutes  Recommendations for Outpatient Follow-up:  1. Repeat basic metabolic panel to follow electrolytes and renal function 2. Assess blood pressure/volume status and adjust antihypertensive regimen and diuretics as needed. 3. Repeat CBC to follow Hgb trend    Discharge Diagnoses:  Principal Problem:   Abdominal pain Active Problems:   Normocytic normochromic anemia   Hypothyroidism   Hypertension   Chronic atrial fibrillation (HCC)   CKD (chronic kidney disease) stage 3, GFR 30-59 ml/min (HCC)   Candidal cystitis and urethritis   Dysuria   Acute suprapubic pain   Discharge Condition: Stable and improved.  Patient discharged home with instructions to follow-up with PCP in 10 days.  Diet recommendation: heart healthy diet  Filed Weights   12/22/17 1446 12/22/17 2156  Weight: 63.5 kg (140 lb) 69.5 kg (153 lb 3.5 oz)    History of present illness:  Patient be written by Dr. Lorin Jackson on 12/22/2017. 84 y.o. male with medical history significant of macular degeneration/glaucoma with essential blindness; hypothyroidism; HTN; stage 3 CKD; afib not on AC; and bladder cancer with solitary kidney/left ureteral stent/hydronephrosis presenting with abdominal pain, dysuria, frequency.  His son received a call this afternoon - he was unable to void, significant upper/left-sided abdominal pain.  He was able to void in the ER and the discomfort has gone away for the most part.  He always has pain on the left due to hydronephrosis from the solitary kidney and stent.  He also has chronic hematuria.  Hgb after prior GI bleed was 10.3 at the time of discharge on 4/16.  No more known GI issues since last hospitalization.  He had acute onset of upper abdominal pain today, but now he reports that it started about 1 week ago.   Large BMs/gas intermixed with diarrhea.  +dysuria x 1 month - not unusual for him.   Hospital Course:  1-abdominal pain -Patient with known history of bladder cancer and prior GI bleed in April 2019 who presented with abdominal pain and urinary retention. -While in the ED prior to any intervention patient was able to void and he has complete resolution of his symptoms -Patient was found with yeast infection in the urine and after contacting urology on-call (Dr. Alinda Money) decision was made to treat for a 10 days course with Diflucan. -Patient was placed on observation to assure that there was no further recurrence of symptoms; at what appears to be most likely urinary  Spasm. -Outpatient follow-up with urology after discharge.  2-anemia  -stable overall -No signs of acute bleeding -Repeat CBC to follow hemoglobin trend as an outpatient  3-Chronic kidney disea  -Improved and close to baseline at discharge -Continue Lasix as adjusted by PCP taken 20 mg twice a day only -No further lisinopril will be pursued. -Repeat be made to follow electrolytes and renal function follow-up visit.  4-essential hypertension -Continue Lopressor  5-chronic atrial fibrillation -Continue beta-blocker for rate control -No anticoagulation given prior history of GI bleeding.  6-candidal cystitis -Patient will be treated with Diflucan for 10 days (see above for details) -Advised to keep himself well-hydrated.   Procedures:  Low for x-ray reports.  Consultations:  Urology curbside (Dr. Lorin Jackson discussed case and received recommendations by Dr. Alinda Money   Discharge Exam: Vitals:   12/22/17 2156 12/23/17 0526  BP: 114/75 129/77  Pulse: (!) 106  98  Resp: 18   Temp: 97.9 F (36.6 C) 97.6 F (36.4 C)  SpO2: 99% 99%    General: Afebrile, denies chest pain, no nausea, no vomiting.  Patient is voiding without any discomfort at this time. Cardiovascular: Rate control, no rubs, no gallops, no  JVD Respiratory: No wheezing, no crackles, good air movement bilaterally.  Good oxygen saturation on room air. Abdomen: Soft, nontender, nondistended, positive bowel sounds. Skin: left temporal region with ulcerated SBK wound; no signs of superimposed infection.  Discharge Instructions   Discharge Instructions    Diet - low sodium heart healthy   Complete by:  As directed    Discharge instructions   Complete by:  As directed    Take medications as prescribed Keep yourself well-hydrated Follow heart healthy diet and watch your weight on daily basis. Arrange follow-up with PCP in 10 days and follow with urology as previously scheduled.     Allergies as of 12/23/2017      Reactions   Codeine Nausea And Vomiting   Lisinopril Cough   Ultram [tramadol Hcl] Other (See Comments)   Confusion       Medication List    STOP taking these medications   lisinopril 2.5 MG tablet Commonly known as:  PRINIVIL,ZESTRIL     TAKE these medications   acetaminophen 500 MG tablet Commonly known as:  TYLENOL Take 1,000 mg by mouth every 12 (twelve) hours. 6 am and 6 pm   ALPHAGAN P 0.1 % Soln Generic drug:  brimonidine Place 1 drop into both eyes 3 (three) times daily.   fluconazole 100 MG tablet Commonly known as:  DIFLUCAN Take 1 tablet (100 mg total) by mouth daily for 10 days. Start taking on:  12/24/2017   furosemide 20 MG tablet Commonly known as:  LASIX Take 1 tablet (20 mg total) by mouth 2 (two) times daily. What changed:  when to take this   Iron 325 (65 Fe) MG Tabs Take 325 mg by mouth 2 (two) times daily.   latanoprost 0.005 % ophthalmic solution Commonly known as:  XALATAN Place 1 drop into both eyes at bedtime.   levothyroxine 75 MCG tablet Commonly known as:  SYNTHROID, LEVOTHROID Take 75 mcg by mouth daily before breakfast.   metoprolol tartrate 25 MG tablet Commonly known as:  LOPRESSOR Take 12.5 mg by mouth 3 (three) times daily.   pantoprazole 40 MG  tablet Commonly known as:  PROTONIX Take 1 tablet (40 mg total) by mouth 2 (two) times daily.   sodium bicarbonate 650 MG tablet Take 650 mg by mouth daily.   tamsulosin 0.4 MG Caps capsule Commonly known as:  FLOMAX Take 0.4 mg by mouth at bedtime.      Allergies  Allergen Reactions  . Codeine Nausea And Vomiting  . Lisinopril Cough  . Ultram [Tramadol Hcl] Other (See Comments)    Confusion    Follow-up Information    Hasanaj, Samul Dada, MD. Schedule an appointment as soon as possible for a visit in 10 day(s).   Specialty:  Internal Medicine Contact information: [place] [phone]           The results of significant diagnostics from this hospitalization (including imaging, microbiology, ancillary and laboratory) are listed below for reference.    Significant Diagnostic Studies: Ct Abdomen Pelvis Wo Contrast  Result Date: 12/22/2017 CLINICAL DATA:  Abdominal pain. Dysuria. Prostate cancer. Solitary kidney. EXAM: CT ABDOMEN AND PELVIS WITHOUT CONTRAST TECHNIQUE: Multidetector CT  imaging of the abdomen and pelvis was performed following the standard protocol without IV contrast. COMPARISON:  10/20/2017 FINDINGS: Lower chest: Interstitial accentuation at the lung bases is likely within normal variation in this age group. Moderate cardiomegaly with coronary artery atherosclerosis. Trace right pleural fluid persists. Hepatobiliary: Normal liver. Normal gallbladder, without biliary ductal dilatation. Pancreas: Normal pancreas for age, without duct dilatation. Spleen: Normal in size, without focal abnormality. Adrenals/Urinary Tract: Similar bilateral adrenal nodularity, greater on the left. Right renal absence. Left renal cortical thinning. There is left extrarenal pelvis without overt hydronephrosis. Left ureteric stent originates in the left extrarenal pelvis and terminates in the urinary bladder. No urinary tract calculi. Stomach/Bowel: Proximal  gastric underdistention. Extensive colonic diverticulosis. Normal terminal ileum. The appendix is likely identified on 56/2. No surrounding inflammation. Normal small bowel caliber. Vascular/Lymphatic: Advanced aortic and branch vessel atherosclerosis. No abdominopelvic adenopathy. Reproductive: Moderate prostatomegaly with probable TURP defect. Other: No significant free fluid. No free intraperitoneal air. Right inguinal hernia repair. Fat containing ventral abdominal wall hernia. Musculoskeletal: Lumbosacral spondylosis is advanced. A chronic compression deformity at T11 is moderate to severe with mild ventral canal encroachment. S-shaped lumbar spine curvature. IMPRESSION: 1.  No acute process in the abdomen or pelvis. 2. Solitary left kidney with ureteric stent in place. Left extrarenal pelvis, without overt hydronephrosis and no evidence of ureteric calculi. 3. Small right pleural effusion. 4. Coronary artery atherosclerosis. Aortic Atherosclerosis (ICD10-I70.0). 5. Other incidental findings, as above. Electronically Signed   By: Abigail Miyamoto M.D.   On: 12/22/2017 16:54   Dg Chest 1 View  Result Date: 12/22/2017 CLINICAL DATA:  Abdominal pain. EXAM: CHEST  1 VIEW COMPARISON:  10/02/2017 FINDINGS: There is cardiac enlargement. Aortic atherosclerosis noted. Decreased lung volumes with asymmetric elevation of right hemidiaphragm. Asymmetric airspace opacity within the left base is identified. Right lung relatively clear. IMPRESSION: 1. Left base airspace opacity worrisome for pneumonia. Followup PA and lateral chest X-ray is recommended in 3-4 weeks following trial of antibiotic therapy to ensure resolution and exclude underlying malignancy. 2. Cardiac enlargement and aortic atherosclerosis. Aortic Atherosclerosis (ICD10-I70.0). Electronically Signed   By: Kerby Moors M.D.   On: 12/22/2017 17:11   Dg Chest 2 View  Result Date: 12/22/2017 CLINICAL DATA:  Chest pain EXAM: CHEST - 2 VIEW COMPARISON:   12/22/2017, 10/02/2016 FINDINGS: Cardiomegaly with vascular congestion. Streaky atelectasis at the left base. No pleural effusion. Aortic atherosclerosis. No pneumothorax. IMPRESSION: 1. Streaky atelectasis at the left base.  No focal consolidation. 2. Cardiomegaly with vascular congestion. Electronically Signed   By: Donavan Foil M.D.   On: 12/22/2017 19:14    Microbiology: No results found for this or any previous visit (from the past 240 hour(s)).   Labs: Basic Metabolic Panel: Recent Labs  Lab 12/22/17 1450 12/23/17 0354  NA 134* 139  K 3.9 4.1  CL 103 108  CO2 24 22  GLUCOSE 115* 97  BUN 36* 34*  CREATININE 2.29* 2.04*  CALCIUM 8.4* 8.7*   Liver Function Tests: Recent Labs  Lab 12/22/17 1450  AST 15  ALT 9*  ALKPHOS 53  BILITOT 0.7  PROT 7.7  ALBUMIN 2.8*   Recent Labs  Lab 12/22/17 1450  LIPASE 21   CBC: Recent Labs  Lab 12/22/17 1450 12/23/17 0354  WBC 3.8* 3.6*  NEUTROABS 1.9  --   HGB 9.2* 9.3*  HCT 29.7* 29.6*  MCV 101.7* 101.0*  PLT 171 163   Cardiac Enzymes: Recent Labs  Lab 12/22/17 1450 12/22/17 2156 12/23/17  0353 12/23/17 0905  TROPONINI 0.03* 0.04* 0.03* 0.04*      Signed:  Barton Dubois MD.  [facility] 12/23/2017, 12:06 PM

## 2018-01-15 DIAGNOSIS — N134 Hydroureter: Secondary | ICD-10-CM | POA: Diagnosis not present

## 2018-01-15 DIAGNOSIS — R8279 Other abnormal findings on microbiological examination of urine: Secondary | ICD-10-CM | POA: Diagnosis not present

## 2018-01-15 DIAGNOSIS — C651 Malignant neoplasm of right renal pelvis: Secondary | ICD-10-CM | POA: Diagnosis not present

## 2018-01-15 DIAGNOSIS — N183 Chronic kidney disease, stage 3 (moderate): Secondary | ICD-10-CM | POA: Diagnosis not present

## 2018-01-15 DIAGNOSIS — C672 Malignant neoplasm of lateral wall of bladder: Secondary | ICD-10-CM | POA: Diagnosis not present

## 2018-01-18 ENCOUNTER — Other Ambulatory Visit: Payer: Self-pay | Admitting: Urology

## 2018-01-28 ENCOUNTER — Emergency Department (HOSPITAL_COMMUNITY): Payer: Medicare Other

## 2018-01-28 ENCOUNTER — Other Ambulatory Visit: Payer: Self-pay

## 2018-01-28 ENCOUNTER — Encounter (HOSPITAL_COMMUNITY): Payer: Self-pay | Admitting: Emergency Medicine

## 2018-01-28 ENCOUNTER — Emergency Department (HOSPITAL_COMMUNITY)
Admission: EM | Admit: 2018-01-28 | Discharge: 2018-01-29 | Disposition: A | Discharge status: Home / Self Care | Payer: Medicare Other | Attending: Emergency Medicine | Admitting: Emergency Medicine

## 2018-01-28 ENCOUNTER — Encounter (HOSPITAL_BASED_OUTPATIENT_CLINIC_OR_DEPARTMENT_OTHER): Payer: Self-pay | Admitting: *Deleted

## 2018-01-28 DIAGNOSIS — I130 Hypertensive heart and chronic kidney disease with heart failure and stage 1 through stage 4 chronic kidney disease, or unspecified chronic kidney disease: Secondary | ICD-10-CM | POA: Insufficient documentation

## 2018-01-28 DIAGNOSIS — R4020 Unspecified coma: Secondary | ICD-10-CM | POA: Diagnosis not present

## 2018-01-28 DIAGNOSIS — Z8551 Personal history of malignant neoplasm of bladder: Secondary | ICD-10-CM | POA: Diagnosis not present

## 2018-01-28 DIAGNOSIS — I4891 Unspecified atrial fibrillation: Secondary | ICD-10-CM | POA: Diagnosis not present

## 2018-01-28 DIAGNOSIS — I509 Heart failure, unspecified: Secondary | ICD-10-CM | POA: Insufficient documentation

## 2018-01-28 DIAGNOSIS — Z79899 Other long term (current) drug therapy: Secondary | ICD-10-CM | POA: Insufficient documentation

## 2018-01-28 DIAGNOSIS — R404 Transient alteration of awareness: Secondary | ICD-10-CM | POA: Diagnosis not present

## 2018-01-28 DIAGNOSIS — J90 Pleural effusion, not elsewhere classified: Secondary | ICD-10-CM | POA: Diagnosis not present

## 2018-01-28 DIAGNOSIS — Z87891 Personal history of nicotine dependence: Secondary | ICD-10-CM | POA: Insufficient documentation

## 2018-01-28 DIAGNOSIS — E039 Hypothyroidism, unspecified: Secondary | ICD-10-CM | POA: Diagnosis not present

## 2018-01-28 DIAGNOSIS — N183 Chronic kidney disease, stage 3 (moderate): Secondary | ICD-10-CM | POA: Diagnosis not present

## 2018-01-28 DIAGNOSIS — N390 Urinary tract infection, site not specified: Secondary | ICD-10-CM | POA: Diagnosis not present

## 2018-01-28 DIAGNOSIS — R000 Tachycardia, unspecified: Secondary | ICD-10-CM | POA: Diagnosis not present

## 2018-01-28 DIAGNOSIS — R531 Weakness: Secondary | ICD-10-CM | POA: Diagnosis not present

## 2018-01-28 DIAGNOSIS — R4182 Altered mental status, unspecified: Secondary | ICD-10-CM | POA: Diagnosis not present

## 2018-01-28 LAB — URINALYSIS, ROUTINE W REFLEX MICROSCOPIC
BILIRUBIN URINE: NEGATIVE
Glucose, UA: NEGATIVE mg/dL
KETONES UR: NEGATIVE mg/dL
NITRITE: NEGATIVE
PROTEIN: 100 mg/dL — AB
RBC / HPF: >50 RBC/hpf — ABNORMAL HIGH (ref 0–5)
Specific Gravity, Urine: 1.012 (ref 1.005–1.030)
pH: 6 (ref 5.0–8.0)

## 2018-01-28 LAB — CBC WITH DIFFERENTIAL/PLATELET
BASOS PCT: 1 %
Basophils Absolute: 0 10*3/uL (ref 0.0–0.1)
EOS ABS: 0.1 10*3/uL (ref 0.0–0.7)
EOS PCT: 2 %
HCT: 31 % — ABNORMAL LOW (ref 39.0–52.0)
HEMOGLOBIN: 10.1 g/dL — AB (ref 13.0–17.0)
Lymphocytes Relative: 35 %
Lymphs Abs: 1.2 10*3/uL (ref 0.7–4.0)
MCH: 31.8 pg (ref 26.0–34.0)
MCHC: 32.6 g/dL (ref 30.0–36.0)
MCV: 97.5 fL (ref 78.0–100.0)
Monocytes Absolute: 0.5 10*3/uL (ref 0.1–1.0)
Monocytes Relative: 15 %
NEUTROS PCT: 47 %
Neutro Abs: 1.7 10*3/uL (ref 1.7–7.7)
PLATELETS: 155 10*3/uL (ref 150–400)
RBC: 3.18 MIL/uL — AB (ref 4.22–5.81)
RDW: 16.1 % — ABNORMAL HIGH (ref 11.5–15.5)
WBC: 3.6 10*3/uL — AB (ref 4.0–10.5)

## 2018-01-28 LAB — COMPREHENSIVE METABOLIC PANEL
ALBUMIN: 2.2 g/dL — AB (ref 3.5–5.0)
ALT: 13 U/L (ref 0–44)
ANION GAP: 6 (ref 5–15)
AST: 26 U/L (ref 15–41)
Alkaline Phosphatase: 56 U/L (ref 38–126)
BUN: 31 mg/dL — ABNORMAL HIGH (ref 8–23)
CHLORIDE: 107 mmol/L (ref 98–111)
CO2: 27 mmol/L (ref 22–32)
Calcium: 8.1 mg/dL — ABNORMAL LOW (ref 8.9–10.3)
Creatinine, Ser: 2 mg/dL — ABNORMAL HIGH (ref 0.61–1.24)
GFR calc non Af Amer: 27 mL/min — ABNORMAL LOW (ref >60)
GFR, EST AFRICAN AMERICAN: 31 mL/min — AB (ref >60)
GLUCOSE: 105 mg/dL — AB (ref 70–99)
Potassium: 3.8 mmol/L (ref 3.5–5.1)
SODIUM: 140 mmol/L (ref 135–145)
Total Bilirubin: 1 mg/dL (ref 0.3–1.2)
Total Protein: 7.1 g/dL (ref 6.5–8.1)

## 2018-01-28 LAB — TROPONIN I: TROPONIN I: 0.04 ng/mL — AB (ref <0.03)

## 2018-01-28 MED ORDER — SODIUM CHLORIDE 0.9 % IV SOLN
Freq: Once | INTRAVENOUS | Status: AC
  Administered 2018-01-28: 500 mL/h via INTRAVENOUS

## 2018-01-28 MED ORDER — CEPHALEXIN 500 MG PO CAPS: 500.0000 mg | ORAL_CAPSULE | Freq: Three times a day (TID) | ORAL | 0 refills | Status: DC

## 2018-01-28 MED ORDER — SODIUM CHLORIDE 0.9 % IV SOLN
1.0000 g | Freq: Once | INTRAVENOUS | Status: AC
  Administered 2018-01-29: 1 g via INTRAVENOUS
  Filled 2018-01-28: qty 10

## 2018-01-28 NOTE — ED Notes (Signed)
Dr Lacinda Axon at the bedside to discuss a plan with son.

## 2018-01-28 NOTE — ED Notes (Signed)
Pt back from x-ray.

## 2018-01-28 NOTE — Progress Notes (Signed)
Spoke w/ pt son, [name] Eisenberg, via phone for pre-op interview.  Son verbalized understanding for pt to be npo after mn w/ exception clear liquids until 0830 (no cream/milk products) and arrive at 1230.   Needs istat 8.  Current ekg and cxr in chart and epic.  Will take synthroid and metoprolol am dos w/ sips of water.  Will need pt son in pre-op , pt legally blind and uses walker.

## 2018-01-28 NOTE — Discharge Instructions (Signed)
Prescription for Keflex or cephalexin.  Increase fluids.  Follow-up with urologist.

## 2018-01-28 NOTE — ED Notes (Signed)
Date and time results received: 01/28/18 5:59 PM  (use smartphrase ".now" to insert current time)  Test: Troponin Critical Value: 0.04  Name of Provider Notified: [facility]  Orders Received? Or Actions Taken?: Orders Received - See Orders for details

## 2018-01-28 NOTE — ED Triage Notes (Signed)
Pt brought in by ems for pt being less responsive than usual.  Pt was up all night wandering around the home thinking someone was there.  Pt today has been sleeping more than usual.  Son is a "doctor" in another city and when the neighbor called him he requested hospital transport.

## 2018-01-28 NOTE — ED Notes (Signed)
Wet pt mouth with sponge. Pt is alert and oriented. Pt states he is schedule for surgery on July 24th. He has planned for that, wondering why he is still here in the ED.

## 2018-01-29 DIAGNOSIS — N390 Urinary tract infection, site not specified: Secondary | ICD-10-CM | POA: Diagnosis not present

## 2018-01-29 NOTE — ED Provider Notes (Signed)
[facility] EMERGENCY DEPARTMENT Provider Note   CSN: [id number] Arrival date & time: 01/28/18  1543     History   Chief Complaint Chief Complaint  Patient presents with  . Altered Mental Status    HPI Austin Jackson is a 84 y.o. male.  Level 5 caveat for altered mental status.  History obtained from EMS and son.  Patient arrived from home via EMS.  He is allegedly less interactive than usual.  He was up all night wandering around the house thinking someone was there.  He has been sleeping more than usual.  His behavior was noted by the neighbor.  The patient has no complaints.  He noted he is scheduled for urological surgery on July 24 (cystoscopy with retrograde pyelogram).  He has a solitary left kidney secondary to a right nephroureterectomy on 10/12/2016 secondary to cancer.     Past Medical History:  Diagnosis Date  . Anemia, normocytic normochromic   . Bilateral lower extremity edema   . 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 Candidal cystitis and urethritis 12/2017  . CHF (congestive heart failure) ([place]) August 25, 202018  . Chronic atrial fibrillation ([place])    dx 2002--  s/p cardioversion approx 2006 or 2007-- per pt last cardiologist visit at that time-- currently be followed by pcp dr Theodis Blaze in [place];  no longer on blood thinner due to advanced age  . CKD (chronic kidney disease), stage III ([place])   . Full dentures   . GERD (gastroesophageal reflux disease)   . Glaucoma, both eyes   . Gross hematuria    intermittent  . History of GI bleed    2013--  per pt unable to find reason but had been on blood thinner,  was transfusion's/  10-10-17 per EGD esophageal candida  . History of kidney stones   . Hypertension   . Hypothyroidism due to amiodarone   . Legally blind    bilateral due to glaucoma and macular degeneration  . Macular  degeneration of both eyes   . OA (osteoarthritis)   . PONV (postoperative nausea and vomiting)   . Solitary left kidney    acquired-- 10-12-2016  s/p  right nerphoureterectomy   . Ureteral stricture, left   . Uses walker     Patient Active Problem List   Diagnosis Date Noted  . Dysuria   . Acute suprapubic pain   . Abdominal pain 12/22/2017  . Candidal cystitis and urethritis 12/22/2017  . Melena 10/20/2017  . Urinary tract infection in male 05/08/2017  . Chronic renal disease, stage III ([place]) 05/08/2017  . Glaucoma, both eyes 05/08/2017  . Chronic atrial fibrillation ([place]) 05/08/2017  . Altered mental status 05/08/2017  . Acute metabolic encephalopathy 03/47/4259  . Acute pyelonephritis 05/08/2017  . CKD (chronic kidney disease) stage 3, GFR 30-59 ml/min (H[facility]) 05/08/2017  . Cellulitis of right leg 05/08/2017  . Urothelial carcinoma of kidney, right ([place]) 10/12/2016  . Hydronephrosis 07/19/2016  . Normocytic normochromic anemia 07/19/2016  . Hypothyroidism 07/19/2016  . Hypertension 07/19/2016  . SIRS (systemic inflammatory response syndrome) ([place]) 07/19/2016  . Hyperkalemia August 25, 202018  . Bladder cancer ([place]) 07/12/2016    Past Surgical History:  Procedure Laterality Date  . CARDIOVERSION  2002  . CATARACT EXTRACTION W/ INTRAOCULAR LENS  IMPLANT, BILATERAL    . COLONOSCOPY  yrs ago  . CYSTOSCOPY W/ URETERAL STENT  PLACEMENT Left 08/01/2017   Procedure: CYSTOSCOPY WITH STENT REPLACEMENT;  Surgeon: [name], MD;  Location: [facility];  Service: Urology;  Laterality: Left;  . CYSTOSCOPY WITH RETROGRADE PYELOGRAM, URETEROSCOPY AND STENT PLACEMENT Left 07/19/2016   Procedure: CYSTOSCOPY WITH RETROGRADE PYELOGRAM,  AND STENT PLACEMENT;  Surgeon: [name], MD;  Location: [facility];  Service: Urology;  Laterality: Left;  . CYSTOSCOPY WITH RETROGRADE PYELOGRAM, URETEROSCOPY AND STENT PLACEMENT Left 01/24/2017   Procedure: CYSTOSCOPY WITH RETROGRADE PYELOGRAM, AND STENT  REPLACEMENT;  Surgeon: [name], MD;  Location: [facility];  Service: Urology;  Laterality: Left;  . CYSTOSCOPY/RETROGRADE/URETEROSCOPY Bilateral 09/15/2016   Procedure: CYSTOSCOPY/ RETROGRADE/DIAGNOSITC URETEROSCOPY/URETERAL STENT EXCHANGE;  Surgeon: [name], MD;  Location: [facility];  Service: Urology;  Laterality: Bilateral;  . ESOPHAGOGASTRODUODENOSCOPY (EGD) WITH PROPOFOL N/A 10/21/2017   Procedure: ESOPHAGOGASTRODUODENOSCOPY (EGD) WITH PROPOFOL;  Surgeon: Wilford Corner, MD;  Location: [facility];  Service: Endoscopy;  Laterality: N/A;  . EXTRACORPOREAL SHOCK WAVE LITHOTRIPSY  1970's  . GLAUCOMA SURGERY Left   . INGUINAL HERNIA REPAIR Bilateral 2007 and 1980's  . ROBOT ASSITED LAPAROSCOPIC NEPHROURETERECTOMY Right 10/12/2016   Procedure: XI ROBOT ASSITED LAPAROSCOPIC NEPHROURETERECTOMY;  Surgeon: [name], MD;  Location: [facility];  Service: Urology;  Laterality: Right;  . TRANSURETHRAL RESECTION OF BLADDER TUMOR Right 07/12/2016   Procedure: TRANSURETHRAL RESECTION OF BLADDER TUMOR (TURBT)/ BILATERAL RETROGRADE PYLEOGRAM/ RIGHT DIAGNOSTIC URETEROSCOPY;  Surgeon: [name], MD;  Location: [facility];  Service: Urology;  Laterality: Right;  . TRANSURETHRAL RESECTION OF PROSTATE  2002        Home Medications    Prior to Admission medications   Medication Sig Start Date End Date Taking? Authorizing Provider  furosemide (LASIX) 20 MG tablet Take 1 tablet (20 mg total) by mouth 2 (two) times daily. Patient taking differently: Take 20 mg by mouth 2 (two) times daily.  12/23/17  Yes Barton Dubois, MD  levothyroxine (SYNTHROID, LEVOTHROID) 50 MCG tablet Take 50 mcg by mouth daily before breakfast.    Yes [provider]  metoprolol tartrate (LOPRESSOR) 25 MG tablet Take 12.5 mg by mouth 3 (three) times daily.    Yes [provider]  sodium bicarbonate 650 MG tablet Take 650 mg by mouth every morning.  12/04/17  Yes [provider]    tamsulosin (FLOMAX) 0.4 MG CAPS capsule Take 0.4 mg by mouth at bedtime.    Yes [provider]  acetaminophen (TYLENOL) 500 MG tablet Take 1,000 mg by mouth every 12 (twelve) hours. 6 am and 6 pm    [provider]  ALPHAGAN P 0.1 % SOLN Place 1 drop into both eyes 3 (three) times daily.  07/01/16   [provider]  cephALEXin (KEFLEX) 500 MG capsule Take 1 capsule (500 mg total) by mouth 3 (three) times daily. 01/28/18   Nat Christen, MD  Ferrous Sulfate (IRON) 325 (65 Fe) MG TABS Take 325 mg by mouth 2 (two) times daily.     [provider]  latanoprost (XALATAN) 0.005 % ophthalmic solution Place 1 drop into both eyes at bedtime.     [provider]  lisinopril (PRINIVIL,ZESTRIL) 2.5 MG tablet Take 2.5 mg by mouth daily.    [provider]    Family History Family History  Problem Relation Age of Onset  . Stroke Mother   . Heart disease Father   . Heart disease Brother   . Heart disease Paternal Grandfather   . Heart disease Brother   . Kidney cancer Neg  Hx   . Diabetes Mellitus II Neg Hx   . CAD Neg Hx     Social History Social History   Tobacco Use  . Smoking status: Former Smoker    Packs/day: 2.00    Years: 30.00    Pack years: 60.00    Types: Cigarettes    Last attempt to quit: 07/07/1984    Years since quitting: 33.5  . Smokeless tobacco: Never Used  Substance Use Topics  . Alcohol use: Yes    Comment: occasional beer  . Drug use: No     Allergies   Codeine; Lisinopril; and Ultram [tramadol hcl]   Review of Systems Review of Systems  Unable to perform ROS: Mental status change     Physical Exam Updated Vital Signs BP 137/65   Pulse 97   Resp (!) 22   Wt 69.4 kg (153 lb)   SpO2 96%   BMI 23.26 kg/m   Physical Exam  Constitutional: He is oriented to person, place, and time.  Dehydrated, answering questions appropriately.  HENT:  Head: Normocephalic and atraumatic.  Eyes: Conjunctivae are  normal.  Neck: Neck supple.  Cardiovascular: Normal rate and regular rhythm.  Pulmonary/Chest: Effort normal and breath sounds normal.  Abdominal: Soft. Bowel sounds are normal.  Genitourinary:  Genitourinary Comments: No CVAT.  Musculoskeletal: Normal range of motion.  Neurological: He is alert and oriented to person, place, and time.  Skin: Skin is warm and dry.  Psychiatric: He has a normal mood and affect. His behavior is normal.  Nursing note and vitals reviewed.    ED Treatments / Results  Labs (all labs ordered are listed, but only abnormal results are displayed) Labs Reviewed  CBC WITH DIFFERENTIAL/PLATELET - Abnormal; Notable for the following components:      Result Value   WBC 3.6 (*)    RBC 3.18 (*)    Hemoglobin 10.1 (*)    HCT 31.0 (*)    RDW 16.1 (*)    All other components within normal limits  COMPREHENSIVE METABOLIC PANEL - Abnormal; Notable for the following components:   Glucose, Bld 105 (*)    BUN 31 (*)    Creatinine, Ser 2.00 (*)    Calcium 8.1 (*)    Albumin 2.2 (*)    GFR calc non Af Amer 27 (*)    GFR calc Af Amer 31 (*)    All other components within normal limits  URINALYSIS, ROUTINE W REFLEX MICROSCOPIC - Abnormal; Notable for the following components:   Color, Urine AMBER (*)    APPearance TURBID (*)    Hgb urine dipstick LARGE (*)    Protein, ur 100 (*)    Leukocytes, UA MODERATE (*)    RBC / HPF >50 (*)    WBC, UA >50 (*)    Bacteria, UA FEW (*)    All other components within normal limits  TROPONIN I - Abnormal; Notable for the following components:   Troponin I 0.04 (*)    All other components within normal limits  URINE CULTURE    EKG EKG Interpretation  Date/Time:  Monday January 28 2018 16:00:01 EDT Ventricular Rate:  85 PR Interval:    QRS Duration: 105 QT Interval:  503 QTC Calculation: 551 R Axis:   127 Text Interpretation:  Atrial fibrillation Right axis deviation Low voltage, extremity and precordial leads Prolonged  QT interval Confirmed by Nat Christen [phone]) on 01/28/2018 5:54:08 PM   Radiology Ct Head Wo Contrast  Result Date: 01/28/2018  CLINICAL DATA:  84 year old male less responsive than usual. Initial encounter. EXAM: CT HEAD WITHOUT CONTRAST TECHNIQUE: Contiguous axial images were obtained from the base of the skull through the vertex without intravenous contrast. COMPARISON:  None. FINDINGS: Brain: No intracranial hemorrhage or CT evidence of large acute infarct. Marked chronic microvascular changes. Atrophy. No intracranial mass lesion noted on this unenhanced exam. Vascular: Vascular calcifications Skull: No acute abnormality. Sinuses/Orbits: No acute orbital abnormality. Visualized paranasal sinuses are clear. Other: Mastoid air cells and middle ear cavities are clear. IMPRESSION: No acute intracranial abnormality. Marked chronic microvascular changes. Atrophy. Electronically Signed   By: Genia Del M.D.   On: 01/28/2018 17:56   Dg Chest Port 1 View  Result Date: 01/28/2018 CLINICAL DATA:  Altered mental status. EXAM: PORTABLE CHEST 1 VIEW COMPARISON:  Radiographs of December 22, 2017. FINDINGS: Stable cardiomegaly with central pulmonary vascular congestion. Atherosclerosis of thoracic aorta is noted. Increased bilateral perihilar and basilar densities are noted concerning for worsening edema or atelectasis. No pneumothorax is noted. Small pleural effusions are noted. Bony thorax is unremarkable. IMPRESSION: Stable cardiomegaly with central pulmonary vascular congestion and increased bilateral perihilar and basilar interstitial densities concerning for edema or possibly subsegmental atelectasis. Small bilateral pleural effusions are now noted as well. Aortic Atherosclerosis (ICD10-I70.0). Electronically Signed   By: Marijo Conception, M.D.   On: 01/28/2018 17:28    Procedures Procedures (including critical care time)  Medications Ordered in ED Medications  0.9 %  sodium chloride infusion ( Intravenous  Stopped 01/28/18 1906)  cefTRIAXone (ROCEPHIN) 1 g in sodium chloride 0.9 % 100 mL IVPB (0 g Intravenous Stopped 01/29/18 0051)     Initial Impression / Assessment and Plan / ED Course  I have reviewed the triage vital signs and the nursing notes.  Pertinent labs & imaging results that were available during my care of the patient were reviewed by me and considered in my medical decision making (see chart for details).     Patient presents with altered mental status.  I was able to discuss his condition with his son who is a physician.  He states reasonably normal baseline behavior.  Urinalysis shows evidence of infection.  Labs stable.  He is in atrial fibrillation, but this is not new.  CT head and chest x-ray showed no acute findings.  He was given IV fluids and IV Rocephin in the emergency department.  Urine culture.  Discharge medications cephalexin 500 mg.  These findings were discussed with the son and he agrees with treatment plan.  Final Clinical Impressions(s) / ED Diagnoses   Final diagnoses:  Urinary tract infection without hematuria, site unspecified    ED Discharge Orders        Ordered    cephALEXin (KEFLEX) 500 MG capsule  3 times daily     01/28/18 2358       Nat Christen, MD 01/29/18 1537

## 2018-01-30 ENCOUNTER — Ambulatory Visit (HOSPITAL_BASED_OUTPATIENT_CLINIC_OR_DEPARTMENT_OTHER): Payer: Medicare Other | Admitting: Anesthesiology

## 2018-01-30 ENCOUNTER — Other Ambulatory Visit: Payer: Self-pay

## 2018-01-30 ENCOUNTER — Encounter (HOSPITAL_BASED_OUTPATIENT_CLINIC_OR_DEPARTMENT_OTHER): Payer: Self-pay | Admitting: *Deleted

## 2018-01-30 ENCOUNTER — Encounter (HOSPITAL_BASED_OUTPATIENT_CLINIC_OR_DEPARTMENT_OTHER)
Admission: RE | Disposition: A | Discharge status: Home / Self Care | Payer: Self-pay | Source: Ambulatory Visit | Attending: Urology

## 2018-01-30 ENCOUNTER — Ambulatory Visit (HOSPITAL_BASED_OUTPATIENT_CLINIC_OR_DEPARTMENT_OTHER)
Admission: RE | Admit: 2018-01-30 | Discharge: 2018-01-30 | Disposition: A | Discharge status: Home / Self Care | Payer: Medicare Other | Source: Ambulatory Visit | Attending: Urology | Admitting: Urology

## 2018-01-30 DIAGNOSIS — I482 Chronic atrial fibrillation: Secondary | ICD-10-CM | POA: Insufficient documentation

## 2018-01-30 DIAGNOSIS — J449 Chronic obstructive pulmonary disease, unspecified: Secondary | ICD-10-CM | POA: Diagnosis not present

## 2018-01-30 DIAGNOSIS — H3530 Unspecified macular degeneration: Secondary | ICD-10-CM | POA: Diagnosis not present

## 2018-01-30 DIAGNOSIS — Z905 Acquired absence of kidney: Secondary | ICD-10-CM | POA: Insufficient documentation

## 2018-01-30 DIAGNOSIS — Z79899 Other long term (current) drug therapy: Secondary | ICD-10-CM | POA: Insufficient documentation

## 2018-01-30 DIAGNOSIS — N183 Chronic kidney disease, stage 3 (moderate): Secondary | ICD-10-CM | POA: Diagnosis not present

## 2018-01-30 DIAGNOSIS — I509 Heart failure, unspecified: Secondary | ICD-10-CM | POA: Diagnosis not present

## 2018-01-30 DIAGNOSIS — Z8249 Family history of ischemic heart disease and other diseases of the circulatory system: Secondary | ICD-10-CM | POA: Insufficient documentation

## 2018-01-30 DIAGNOSIS — Z8551 Personal history of malignant neoplasm of bladder: Secondary | ICD-10-CM | POA: Diagnosis not present

## 2018-01-30 DIAGNOSIS — E038 Other specified hypothyroidism: Secondary | ICD-10-CM | POA: Diagnosis not present

## 2018-01-30 DIAGNOSIS — M1990 Unspecified osteoarthritis, unspecified site: Secondary | ICD-10-CM | POA: Diagnosis not present

## 2018-01-30 DIAGNOSIS — Z888 Allergy status to other drugs, medicaments and biological substances status: Secondary | ICD-10-CM | POA: Insufficient documentation

## 2018-01-30 DIAGNOSIS — Z885 Allergy status to narcotic agent status: Secondary | ICD-10-CM | POA: Insufficient documentation

## 2018-01-30 DIAGNOSIS — Z87891 Personal history of nicotine dependence: Secondary | ICD-10-CM | POA: Diagnosis not present

## 2018-01-30 DIAGNOSIS — I130 Hypertensive heart and chronic kidney disease with heart failure and stage 1 through stage 4 chronic kidney disease, or unspecified chronic kidney disease: Secondary | ICD-10-CM | POA: Insufficient documentation

## 2018-01-30 DIAGNOSIS — Z8553 Personal history of malignant neoplasm of renal pelvis: Secondary | ICD-10-CM | POA: Diagnosis not present

## 2018-01-30 DIAGNOSIS — N131 Hydronephrosis with ureteral stricture, not elsewhere classified: Secondary | ICD-10-CM | POA: Diagnosis not present

## 2018-01-30 DIAGNOSIS — Z466 Encounter for fitting and adjustment of urinary device: Secondary | ICD-10-CM | POA: Diagnosis not present

## 2018-01-30 DIAGNOSIS — H548 Legal blindness, as defined in USA: Secondary | ICD-10-CM | POA: Insufficient documentation

## 2018-01-30 DIAGNOSIS — E039 Hypothyroidism, unspecified: Secondary | ICD-10-CM | POA: Diagnosis not present

## 2018-01-30 DIAGNOSIS — N135 Crossing vessel and stricture of ureter without hydronephrosis: Secondary | ICD-10-CM | POA: Diagnosis not present

## 2018-01-30 HISTORY — PX: CYSTOSCOPY W/ RETROGRADES: SHX1426

## 2018-01-30 HISTORY — DX: Reserved for concepts with insufficient information to code with codable children: IMO0002

## 2018-01-30 HISTORY — DX: Candidal cystitis and urethritis: B37.41

## 2018-01-30 HISTORY — DX: Legal blindness, as defined in USA: H54.8

## 2018-01-30 HISTORY — DX: Crossing vessel and stricture of ureter without hydronephrosis: N13.5

## 2018-01-30 HISTORY — DX: Anemia, unspecified: D64.9

## 2018-01-30 HISTORY — PX: CYSTOSCOPY W/ URETERAL STENT PLACEMENT: SHX1429

## 2018-01-30 HISTORY — DX: Renal agenesis, unilateral: Q60.0

## 2018-01-30 HISTORY — DX: Dependence on other enabling machines and devices: Z99.89

## 2018-01-30 LAB — POCT I-STAT, CHEM 8
BUN: 29 mg/dL — ABNORMAL HIGH (ref 8–23)
CALCIUM ION: 1.16 mmol/L (ref 1.15–1.40)
CHLORIDE: 104 mmol/L (ref 98–111)
CREATININE: 2.1 mg/dL — AB (ref 0.61–1.24)
GLUCOSE: 98 mg/dL (ref 70–99)
HCT: 35 % — ABNORMAL LOW (ref 39.0–52.0)
Hemoglobin: 11.9 g/dL — ABNORMAL LOW (ref 13.0–17.0)
POTASSIUM: 4 mmol/L (ref 3.5–5.1)
Sodium: 143 mmol/L (ref 135–145)
TCO2: 25 mmol/L (ref 22–32)

## 2018-01-30 SURGERY — CYSTOSCOPY, WITH RETROGRADE PYELOGRAM
Anesthesia: General | Site: Renal | Laterality: Left

## 2018-01-30 MED ORDER — ONDANSETRON HCL 4 MG/2ML IJ SOLN
4.0000 mg | Freq: Once | INTRAMUSCULAR | Status: DC | PRN
  Filled 2018-01-30: qty 2

## 2018-01-30 MED ORDER — LIDOCAINE 2% (20 MG/ML) 5 ML SYRINGE
INTRAMUSCULAR | Status: DC | PRN
  Administered 2018-01-30: 60 mg via INTRAVENOUS

## 2018-01-30 MED ORDER — SODIUM CHLORIDE 0.9 % IV SOLN
1.0000 g | INTRAVENOUS | Status: AC
  Administered 2018-01-30: 1 g via INTRAVENOUS
  Filled 2018-01-30: qty 10

## 2018-01-30 MED ORDER — STERILE WATER FOR IRRIGATION IR SOLN
Status: DC | PRN
  Administered 2018-01-30: 3000 mL

## 2018-01-30 MED ORDER — FENTANYL CITRATE (PF) 100 MCG/2ML IJ SOLN
INTRAMUSCULAR | Status: AC
  Filled 2018-01-30: qty 2

## 2018-01-30 MED ORDER — FENTANYL CITRATE (PF) 100 MCG/2ML IJ SOLN
25.0000 ug | INTRAMUSCULAR | Status: DC | PRN
  Administered 2018-01-30: 12.5 ug via INTRAVENOUS
  Filled 2018-01-30: qty 1

## 2018-01-30 MED ORDER — SODIUM CHLORIDE 0.9 % IV SOLN
INTRAVENOUS | Status: DC
  Administered 2018-01-30: 13:00:00 via INTRAVENOUS
  Filled 2018-01-30: qty 1000

## 2018-01-30 MED ORDER — MEPERIDINE HCL 25 MG/ML IJ SOLN
6.2500 mg | INTRAMUSCULAR | Status: DC | PRN
Start: 2018-01-30 — End: 2018-01-30
  Filled 2018-01-30: qty 1

## 2018-01-30 MED ORDER — ONDANSETRON HCL 4 MG/2ML IJ SOLN
INTRAMUSCULAR | Status: DC | PRN
  Administered 2018-01-30: 4 mg via INTRAVENOUS

## 2018-01-30 MED ORDER — PROPOFOL 10 MG/ML IV BOLUS
INTRAVENOUS | Status: DC | PRN
  Administered 2018-01-30 (×3): 20 mg via INTRAVENOUS

## 2018-01-30 MED ORDER — SODIUM CHLORIDE 0.9 % IV SOLN
INTRAVENOUS | Status: AC
  Filled 2018-01-30: qty 100

## 2018-01-30 MED ORDER — ONDANSETRON HCL 4 MG/2ML IJ SOLN
INTRAMUSCULAR | Status: AC
  Filled 2018-01-30: qty 2

## 2018-01-30 MED ORDER — FENTANYL CITRATE (PF) 100 MCG/2ML IJ SOLN
INTRAMUSCULAR | Status: DC | PRN
  Administered 2018-01-30 (×2): 25 ug via INTRAVENOUS

## 2018-01-30 MED ORDER — LIDOCAINE 2% (20 MG/ML) 5 ML SYRINGE
INTRAMUSCULAR | Status: AC
  Filled 2018-01-30: qty 5

## 2018-01-30 MED ORDER — CEFTRIAXONE SODIUM 1 G IJ SOLR
INTRAMUSCULAR | Status: AC
  Filled 2018-01-30: qty 10

## 2018-01-30 MED ORDER — LIDOCAINE HCL URETHRAL/MUCOSAL 2 % EX GEL
CUTANEOUS | Status: DC | PRN
  Administered 2018-01-30: 1

## 2018-01-30 MED ORDER — PROPOFOL 10 MG/ML IV BOLUS
INTRAVENOUS | Status: AC
  Filled 2018-01-30: qty 20

## 2018-01-30 MED ORDER — IOHEXOL 300 MG/ML  SOLN
INTRAMUSCULAR | Status: DC | PRN
  Administered 2018-01-30: 10 mL

## 2018-01-30 SURGICAL SUPPLY — 27 items
BAG DRAIN URO-CYSTO SKYTR STRL (DRAIN) ×3 IMPLANT
BASKET LASER NITINOL 1.9FR (BASKET) IMPLANT
CATH INTERMIT  6FR 70CM (CATHETERS) ×3 IMPLANT
CLOTH BEACON ORANGE TIMEOUT ST (SAFETY) ×3 IMPLANT
FIBER LASER FLEXIVA 365 (UROLOGICAL SUPPLIES) IMPLANT
FIBER LASER TRAC TIP (UROLOGICAL SUPPLIES) IMPLANT
GLOVE BIO SURGEON STRL SZ7.5 (GLOVE) ×3 IMPLANT
GLOVE BIOGEL PI IND STRL 7.5 (GLOVE) ×2 IMPLANT
GLOVE BIOGEL PI INDICATOR 7.5 (GLOVE) ×4
GOWN STRL REUS W/TWL LRG LVL3 (GOWN DISPOSABLE) ×6 IMPLANT
GUIDEWIRE ANG ZIPWIRE 038X150 (WIRE) ×3 IMPLANT
GUIDEWIRE STR DUAL SENSOR (WIRE) IMPLANT
INFUSOR MANOMETER BAG 3000ML (MISCELLANEOUS) ×3 IMPLANT
IV NS 1000ML (IV SOLUTION)
IV NS 1000ML BAXH (IV SOLUTION) IMPLANT
IV NS IRRIG 3000ML ARTHROMATIC (IV SOLUTION) IMPLANT
KIT TURNOVER CYSTO (KITS) ×3 IMPLANT
MANIFOLD NEPTUNE II (INSTRUMENTS) ×3 IMPLANT
NS IRRIG 500ML POUR BTL (IV SOLUTION) IMPLANT
PACK CYSTO (CUSTOM PROCEDURE TRAY) ×3 IMPLANT
STENT URET 6FRX26 CONTOUR (STENTS) ×3 IMPLANT
SYR 10ML LL (SYRINGE) ×3 IMPLANT
TUBE CONNECTING 12'X1/4 (SUCTIONS)
TUBE CONNECTING 12X1/4 (SUCTIONS) IMPLANT
TUBE FEEDING 8FR 16IN STR KANG (MISCELLANEOUS) IMPLANT
TUBING UROLOGY SET (TUBING) IMPLANT
WATER STERILE IRR 3000ML UROMA (IV SOLUTION) ×3 IMPLANT

## 2018-01-30 NOTE — Transfer of Care (Signed)
  Last Vitals:  Vitals Value Taken Time  BP 92/54 01/30/2018  3:05 PM  Temp    Pulse 103 01/30/2018  3:08 PM  Resp 19 01/30/2018  3:08 PM  SpO2 100 % 01/30/2018  3:08 PM  Vitals shown include unvalidated device data.  Last Pain:  Vitals:   01/30/18 1243  TempSrc:   PainSc: 0-No pain         Immediate Anesthesia Transfer of Care Note  Patient: Austin Jackson  Procedure(s) Performed: Procedure(s) (LRB): CYSTOSCOPY WITH RETROGRADE PYELOGRAM (Left) CYSTOSCOPY WITH STENT REPLACEMENT (Left)  Patient Location: PACU  Anesthesia Type: MAC  Level of Consciousness: awake, alert  and oriented  Airway & Oxygen Therapy: Patient Spontanous Breathing and Patient connected to face mask oxygen  Post-op Assessment: Report given to PACU RN and Post -op Vital signs reviewed and stable  Post vital signs: Reviewed and stable  Complications: No apparent anesthesia complications

## 2018-01-30 NOTE — Anesthesia Procedure Notes (Signed)
Procedure Name: MAC Date/Time: 01/30/2018 2:37 PM Performed by: Josephine Igo, MD Pre-anesthesia Checklist: Patient identified, Timeout performed, Emergency Drugs available, Suction available and Patient being monitored Oxygen Delivery Method: Simple face mask Placement Confirmation: positive ETCO2,  CO2 detector and breath sounds checked- equal and bilateral

## 2018-01-30 NOTE — Op Note (Signed)
NAMEJAMALL, STROHMEIER MEDICAL RECORD [id number] ACCOUNT [id number] DATE OF BIRTH:11-02-1919 FACILITY: [facility] LOCATION: [facility] PHYSICIAN:Esmeralda Blanford, MD  OPERATIVE REPORT  DATE OF PROCEDURE:  01/30/2018  PREOPERATIVE DIAGNOSIS:  Chronic left ureteral stricture, history of bladder cancer and right renal pelvis cancer.  PROCEDURE: 1.  Cystoscopy with left retrograde pyelogram, interpretation. 2.  Exchange left ureteral stent 6 x 26 contour, no tether.  ESTIMATED BLOOD LOSS:  Nil.    COMPLICATIONS:  None.  SPECIMENS:  None.  FINDINGS: 1.  Persistent left mild hydronephrosis. 2.  Successful left ureteral stent exchange.  INDICATIONS:  The patient is a very pleasant 84 year old gentleman with history of right renal pelvis cancer status post right nephroureterectomy several years ago.  He tolerated that fairly well for his age.  He unfortunately also had bladder cancer and  in treatment of that developed a left ureteral stricture and has been stent dependent for several years with stent changes every 6 months, which he tolerates fairly well.  He has had significant functional decline in the last year; however, he does  remain independent with his wife with significant help with his family and he presents for a left stent exchange today.  Informed consent was obtained and placed in medical record.  DESCRIPTION OF PROCEDURE:  The patient is identified.  The procedure being left ureteral stent change was confirmed.  Procedure timeout was performed.  Intravenous antibiotics administered.  Monitored anesthesia sedation was delivered.  The patient was  placed into a low lithotomy position, sterile field was created by prepping and draping the patient's penis, perineum and proximal thighs, 10 mL of viscous lidocaine was introduced per urethra and allowed to set for approximately 90 seconds and  cystourethroscopy was performed using a 20 French rigid cystoscope with offset lens.   Inspection of the anterior and posterior urethra revealed only prior TURP defect of the prostate, wide-open fossa.  Inspection of bladder revealed no papillary tumors.   There was a distal end of the left ureteral stent seen exiting a new left ureteral orifice.  Right ureteral orifice was surgically absent.  The distal end of the right stent was grasped.  It was brought out in its entirety and set aside for discard.  It  was inspected and intact.  The left ureteral orifice was cannulated with a [place] catheter and left retrograde pyelogram was obtained.  Left retrograde pyelogram demonstrated a single left ureter and single system left kidney.  There was significant tortuosity and some hydronephrosis with minimal hydroureteronephrosis.  As per his typical, a 0.038 ZIPwire was advanced to lower pole of  which a new 6 x 26 contour type stent was placed using cystoscopic and fluoroscopic guidance.  Good proximal and distal points were noted.  Bladder was empty per cystoscope.  Procedure terminated.  The patient tolerated the procedure well.  No immediate  perinatal complications.  The patient was taken to postanesthesia care in stable condition.  TN/NUANCE  D:01/30/2018 T:01/30/2018 JOB:[id number]

## 2018-01-30 NOTE — H&P (Signed)
Austin Jackson is an 84 y.o. male.    Chief Complaint: Pre-op LEFT Ureteral Stent Change.   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   Post-Nephrectomy Course:  06/2017 - CT - no recurrence  12/2017 - CT - no recurrence   2 - LEFT Hydronephrosis - Left UVJ stricure at site of prior bladder cancer managed with Q21mo stenting.   Recent Course:  01/2017 - Left stent exchange 6x26 contour  07/2017 - Left stent exchange 6x26 contour   3 - Solitary Left kidney / Stage 3 Renal Insufficiency - s/p right nephrectomy 10/2016. Most recent Cr 1.5-2 post-op.   PMH sig for AFib (not anticaogulated), mild CHF/lasix (still able to walk [facility]oudn [facility]), gloucoma. Bilateral inguinal hernia repair. His son [name] is ER MD in [facility] and available at [phone]. His PCP is Dr. Eula Fried in [place].   Today " Austin Jackson " is seen to proceed with LEFT Ureteral stent exchange. No interval fevers.     Past Medical History:  Diagnosis Date  . Anemia, normocytic normochromic   . Bilateral lower extremity edema   . 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 Candidal cystitis and urethritis 12/2017  . CHF (congestive heart failure) ([place]) March 27, 202018  . Chronic atrial fibrillation ([place])    dx 2002--  s/p cardioversion approx 2006 or 2007-- per pt last cardiologist visit at that time-- currently be followed by pcp dr Theodis Blaze in [place];  no longer on blood thinner due to advanced age  . CKD (chronic kidney disease), stage III ([place])   . Full dentures   . GERD (gastroesophageal reflux disease)   . Glaucoma, both eyes   . Gross hematuria    intermittent  . History of GI bleed    2013--  per pt unable to find reason but had been on blood  thinner,  was transfusion's/  10-10-17 per EGD esophageal candida  . History of kidney stones   . Hypertension   . Hypothyroidism due to amiodarone   . Legally blind    bilateral due to glaucoma and macular degeneration  . Macular degeneration of both eyes   . OA (osteoarthritis)   . PONV (postoperative nausea and vomiting)   . Solitary left kidney    acquired-- 10-12-2016  s/p  right nerphoureterectomy   . Ureteral stricture, left   . Uses walker     Past Surgical History:  Procedure Laterality Date  . CARDIOVERSION  2002  . CATARACT EXTRACTION W/ INTRAOCULAR LENS  IMPLANT, BILATERAL    . COLONOSCOPY  yrs ago  . CYSTOSCOPY W/ URETERAL STENT PLACEMENT Left 08/01/2017   Procedure: CYSTOSCOPY WITH STENT REPLACEMENT;  Surgeon: [name], MD;  Location: [facility];  Service: Urology;  Laterality: Left;  . CYSTOSCOPY WITH RETROGRADE PYELOGRAM, URETEROSCOPY AND STENT PLACEMENT Left 07/19/2016   Procedure: CYSTOSCOPY WITH RETROGRADE PYELOGRAM,  AND STENT PLACEMENT;  Surgeon: [name], MD;  Location: [facility];  Service: Urology;  Laterality: Left;  . CYSTOSCOPY WITH RETROGRADE PYELOGRAM, URETEROSCOPY AND STENT PLACEMENT Left 01/24/2017   Procedure: CYSTOSCOPY WITH RETROGRADE PYELOGRAM, AND STENT REPLACEMENT;  Surgeon: [name], MD;  Location: [facility];  Service: Urology;  Laterality: Left;  . CYSTOSCOPY/RETROGRADE/URETEROSCOPY Bilateral 09/15/2016   Procedure: CYSTOSCOPY/  RETROGRADE/DIAGNOSITC URETEROSCOPY/URETERAL STENT EXCHANGE;  Surgeon: [name], MD;  Location: [facility];  Service: Urology;  Laterality: Bilateral;  . ESOPHAGOGASTRODUODENOSCOPY (EGD) WITH PROPOFOL N/A 10/21/2017   Procedure: ESOPHAGOGASTRODUODENOSCOPY (EGD) WITH PROPOFOL;  Surgeon: Wilford Corner, MD;  Location: [facility];  Service: Endoscopy;  Laterality: N/A;  . EXTRACORPOREAL SHOCK WAVE LITHOTRIPSY  1970's  . GLAUCOMA SURGERY Left   . INGUINAL HERNIA REPAIR Bilateral 2007 and 1980's  . ROBOT ASSITED  LAPAROSCOPIC NEPHROURETERECTOMY Right 10/12/2016   Procedure: XI ROBOT ASSITED LAPAROSCOPIC NEPHROURETERECTOMY;  Surgeon: [name], MD;  Location: [facility];  Service: Urology;  Laterality: Right;  . TRANSURETHRAL RESECTION OF BLADDER TUMOR Right 07/12/2016   Procedure: TRANSURETHRAL RESECTION OF BLADDER TUMOR (TURBT)/ BILATERAL RETROGRADE PYLEOGRAM/ RIGHT DIAGNOSTIC URETEROSCOPY;  Surgeon: [name], MD;  Location: [facility];  Service: Urology;  Laterality: Right;  . TRANSURETHRAL RESECTION OF PROSTATE  2002    Family History  Problem Relation Age of Onset  . Stroke Mother   . Heart disease Father   . Heart disease Brother   . Heart disease Paternal Grandfather   . Heart disease Brother   . Kidney cancer Neg Hx   . Diabetes Mellitus II Neg Hx   . CAD Neg Hx    Social History:  reports that he quit smoking about 33 years ago. His smoking use included cigarettes. He has a 60.00 pack-year smoking history. He has never used smokeless tobacco. He reports that he drinks alcohol. He reports that he does not use drugs.  Allergies:  Allergies  Allergen Reactions  . Codeine Nausea And Vomiting  . Lisinopril Cough  . Ultram [Tramadol Hcl] Other (See Comments)    Confusion     Medications Prior to Admission  Medication Sig Dispense Refill  . acetaminophen (TYLENOL) 500 MG tablet Take 1,000 mg by mouth every 12 (twelve) hours. 6 am and 6 pm    . ALPHAGAN P 0.1 % SOLN Place 1 drop into both eyes 3 (three) times daily.   5  . cephALEXin (KEFLEX) 500 MG capsule Take 1 capsule (500 mg total) by mouth 3 (three) times daily. 21 capsule 0  . Ferrous Sulfate (IRON) 325 (65 Fe) MG TABS Take 325 mg by mouth 2 (two) times daily.     . furosemide (LASIX) 20 MG tablet Take 1 tablet (20 mg total) by mouth 2 (two) times daily. (Patient taking differently: Take 20 mg by mouth 2 (two) times daily. )    . latanoprost (XALATAN) 0.005 % ophthalmic solution Place 1 drop into both eyes at  bedtime.     Marland Kitchen levothyroxine (SYNTHROID, LEVOTHROID) 50 MCG tablet Take 50 mcg by mouth daily before breakfast.     . metoprolol tartrate (LOPRESSOR) 25 MG tablet Take 12.5 mg by mouth 3 (three) times daily.     . sodium bicarbonate 650 MG tablet Take 650 mg by mouth every morning.   0  . tamsulosin (FLOMAX) 0.4 MG CAPS capsule Take 0.4 mg by mouth at bedtime.     Marland Kitchen lisinopril (PRINIVIL,ZESTRIL) 2.5 MG tablet Take 2.5 mg by mouth daily.      Results for orders placed or performed during the hospital encounter of 01/30/18 (from the past 48 hour(s))  I-STAT, chem 8     Status: Abnormal   Collection Time: 01/30/18 12:44 PM  Result Value Ref Range   Sodium 143 135 - 145 mmol/L   Potassium 4.0 3.5 - 5.1 mmol/L   Chloride 104 98 - 111 mmol/L  BUN 29 (H) 8 - 23 mg/dL   Creatinine, Ser 2.10 (H) 0.61 - 1.24 mg/dL   Glucose, Bld 98 70 - 99 mg/dL   Calcium, Ion 1.16 1.15 - 1.40 mmol/L   TCO2 25 22 - 32 mmol/L   Hemoglobin 11.9 (L) 13.0 - 17.0 g/dL   HCT 35.0 (L) 39.0 - 52.0 %   Ct Head Wo Contrast  Result Date: 01/28/2018 CLINICAL DATA:  84 year old male less responsive than usual. Initial encounter. EXAM: CT HEAD WITHOUT CONTRAST TECHNIQUE: Contiguous axial images were obtained from the base of the skull through the vertex without intravenous contrast. COMPARISON:  None. FINDINGS: Brain: No intracranial hemorrhage or CT evidence of large acute infarct. Marked chronic microvascular changes. Atrophy. No intracranial mass lesion noted on this unenhanced exam. Vascular: Vascular calcifications Skull: No acute abnormality. Sinuses/Orbits: No acute orbital abnormality. Visualized paranasal sinuses are clear. Other: Mastoid air cells and middle ear cavities are clear. IMPRESSION: No acute intracranial abnormality. Marked chronic microvascular changes. Atrophy. Electronically Signed   By: Genia Del M.D.   On: 01/28/2018 17:56   Dg Chest Port 1 View  Result Date: 01/28/2018 CLINICAL DATA:  Altered  mental status. EXAM: PORTABLE CHEST 1 VIEW COMPARISON:  Radiographs of December 22, 2017. FINDINGS: Stable cardiomegaly with central pulmonary vascular congestion. Atherosclerosis of thoracic aorta is noted. Increased bilateral perihilar and basilar densities are noted concerning for worsening edema or atelectasis. No pneumothorax is noted. Small pleural effusions are noted. Bony thorax is unremarkable. IMPRESSION: Stable cardiomegaly with central pulmonary vascular congestion and increased bilateral perihilar and basilar interstitial densities concerning for edema or possibly subsegmental atelectasis. Small bilateral pleural effusions are now noted as well. Aortic Atherosclerosis (ICD10-I70.0). Electronically Signed   By: Marijo Conception, M.D.   On: 01/28/2018 17:28    Review of Systems  Constitutional: Negative.  Negative for fever.  HENT: Negative.   Eyes: Negative.   Respiratory: Negative.   Cardiovascular: Negative.  Negative for chest pain.  Gastrointestinal: Negative.   Genitourinary: Negative.  Negative for flank pain.  Musculoskeletal: Negative.   Skin: Negative.   Neurological: Negative.   Endo/Heme/Allergies: Bruises/bleeds easily.  Psychiatric/Behavioral: Negative.     Blood pressure 113/78, pulse 87, temperature (!) 97.4 F (36.3 C), temperature source Axillary, height 5\' 8"  (1.727 m), weight 69.4 kg (153 lb), SpO2 98 %. Physical Exam  Constitutional:  Very frail, elderly, continues to have functional declien, but AOx3. Stone at [employer].   HENT:  Head: Normocephalic.  Eyes: Pupils are equal, round, and reactive to light.  Cardiovascular: Normal rate.  Respiratory: Effort normal.  GI: Soft.  Genitourinary:  Genitourinary Comments: No CVAT at present.   Musculoskeletal: Normal range of motion.  Neurological: He is alert.  Skin: Skin is warm.  Psychiatric: He has a normal mood and affect.     Assessment/Plan  Proceed as planned with LEFT Ureteral change. Risks, benefits,  alternatives, expected peri-op course discussed perviously with pt and family and reiterated today.   [name], MD 01/30/2018, 2:23 PM

## 2018-01-30 NOTE — Anesthesia Postprocedure Evaluation (Signed)
Anesthesia Post Note  Patient: Austin Jackson  Procedure(s) Performed: CYSTOSCOPY WITH RETROGRADE PYELOGRAM (Left Renal) CYSTOSCOPY WITH STENT REPLACEMENT (Left Renal)     Patient location during evaluation: PACU Anesthesia Type: General Level of consciousness: awake and alert Pain management: pain level controlled Vital Signs Assessment: post-procedure vital signs reviewed and stable Respiratory status: spontaneous breathing, nonlabored ventilation and respiratory function stable Cardiovascular status: blood pressure returned to baseline and stable Postop Assessment: no apparent nausea or vomiting Anesthetic complications: no    Last Vitals:  Vitals:   01/30/18 1540 01/30/18 1545  BP:  121/76  Pulse: (!) 108   Resp: 18 14  Temp:    SpO2: 100% 98%    Last Pain:  Vitals:   01/30/18 1545  TempSrc:   PainSc: 5                  Catalina Gravel

## 2018-01-30 NOTE — Discharge Instructions (Signed)
1 - You may have urinary urgency (bladder spasms) and bloody urine on / off with stent in place. This is normal.  2 - Call MD or go to ER for fever >102, severe pain / nausea / vomiting not relieved by medications, or acute change in medical status  Post Anesthesia Home Care Instructions  Activity: Get plenty of rest for the remainder of the day. A responsible individual must stay with you for 24 hours following the procedure.  For the next 24 hours, DO NOT: -Drive a car -Operate machinery -Drink alcoholic beverages -Take any medication unless instructed by your physician -Make any legal decisions or sign important papers.  Meals: Start with liquid foods such as gelatin or soup. Progress to regular foods as tolerated. Avoid greasy, spicy, heavy foods. If nausea and/or vomiting occur, drink only clear liquids until the nausea and/or vomiting subsides. Call your physician if vomiting continues.  Special Instructions/Symptoms: Your throat may feel dry or sore from the anesthesia or the breathing tube placed in your throat during surgery. If this causes discomfort, gargle with warm salt water. The discomfort should disappear within 24 hours.  If you had a scopolamine patch placed behind your ear for the management of post- operative nausea and/or vomiting:  1. The medication in the patch is effective for 72 hours, after which it should be removed.  Wrap patch in a tissue and discard in the trash. Wash hands thoroughly with soap and water. 2. You may remove the patch earlier than 72 hours if you experience unpleasant side effects which may include dry mouth, dizziness or visual disturbances. 3. Avoid touching the patch. Wash your hands with soap and water after contact with the patch.     

## 2018-01-30 NOTE — OR Nursing (Signed)
Left stent removed by dr Tresa Moore at [phone]

## 2018-01-30 NOTE — Anesthesia Preprocedure Evaluation (Addendum)
Anesthesia Evaluation  Patient identified by MRN, date of birth, ID band Patient awake    Reviewed: Allergy & Precautions, NPO status , Patient's Chart, lab work & pertinent test results, reviewed documented beta blocker date and time   History of Anesthesia Complications (+) PONV and history of anesthetic complications  Airway Mallampati: I  TM Distance: >3 FB Neck ROM: Full    Dental  (+) Edentulous Upper, Edentulous Lower   Pulmonary COPD,  COPD inhaler, former smoker,    breath sounds clear to auscultation       Cardiovascular hypertension, Pt. on medications and Pt. on home beta blockers (-) angina+CHF  + dysrhythmias Atrial Fibrillation  Rhythm:Irregular Rate:Normal  Chronic atrial fibrillation- not on any anticoagulants   Neuro/Psych Glaucoma Macular degeneration- legally blind negative psych ROS   GI/Hepatic Neg liver ROS, GERD  Controlled,  Endo/Other  Hypothyroidism   Renal/GU Renal InsufficiencyRenal disease (creat 1.63)Solitary left kidney Hx/o renal calculi   Hx/o Bladder Ca    Musculoskeletal  (+) Arthritis , Osteoarthritis,    Abdominal   Peds  Hematology  (+) anemia , Hb 10.1   Anesthesia Other Findings   Reproductive/Obstetrics                           Anesthesia Physical  Anesthesia Plan  ASA: III  Anesthesia Plan: General   Post-op Pain Management:    Induction: Intravenous  PONV Risk Score and Plan: 4 or greater and Ondansetron, Dexamethasone and Treatment may vary due to age or medical condition  Airway Management Planned: LMA  Additional Equipment:   Intra-op Plan:   Post-operative Plan: Extubation in OR  Informed Consent: I have reviewed the patients History and Physical, chart, labs and discussed the procedure including the risks, benefits and alternatives for the proposed anesthesia with the patient or authorized representative who has indicated  his/her understanding and acceptance.   Dental advisory given  Plan Discussed with: CRNA and Surgeon  Anesthesia Plan Comments:         Anesthesia Quick Evaluation

## 2018-01-30 NOTE — Brief Op Note (Signed)
01/30/2018  2:57 PM  PATIENT:  Austin Jackson  84 y.o. male  PRE-OPERATIVE DIAGNOSIS:  LEFT URETERAL STRICTURE  POST-OPERATIVE DIAGNOSIS:  LEFT URETERAL STRICTURE  PROCEDURE:  Procedure(s): CYSTOSCOPY WITH RETROGRADE PYELOGRAM (Left) CYSTOSCOPY WITH STENT REPLACEMENT (Left)  SURGEON:  Surgeon(s) and Role:    * [name], MD - Primary  PHYSICIAN ASSISTANT:   ASSISTANTS: none   ANESTHESIA:   MAC  EBL:  minimal   BLOOD ADMINISTERED:none  DRAINS: none   LOCAL MEDICATIONS USED:  LIDOCAINE   SPECIMEN:  No Specimen  DISPOSITION OF SPECIMEN:  N/A  COUNTS:  YES  TOURNIQUET:  * No tourniquets in log *  DICTATION: .Other Dictation: Dictation Number [id number]  PLAN OF CARE: Discharge to home after PACU  PATIENT DISPOSITION:  PACU - hemodynamically stable.   Delay start of Pharmacological VTE agent (>24hrs) due to surgical blood loss or risk of bleeding: yes

## 2018-01-31 ENCOUNTER — Encounter (HOSPITAL_BASED_OUTPATIENT_CLINIC_OR_DEPARTMENT_OTHER): Payer: Self-pay | Admitting: Urology

## 2018-01-31 LAB — URINE CULTURE: Culture: 20000 — AB

## 2018-02-01 ENCOUNTER — Telehealth: Payer: Self-pay

## 2018-02-01 NOTE — Telephone Encounter (Signed)
Post ED Visit - Positive Culture Follow-up  Culture report reviewed by antimicrobial stewardship pharmacist:  []  Elenor Quinones, Pharm.D. []  Heide Guile, Pharm.D., BCPS AQ-ID []  Parks Neptune, Pharm.D., BCPS []  Alycia Rossetti, Pharm.D., BCPS []  [place], Pharm.D., BCPS, AAHIVP []  Legrand Como, Pharm.D., BCPS, AAHIVP []  Salome Arnt, PharmD, BCPS []  Johnnette Gourd, PharmD, BCPS []  Hughes Better, PharmD, BCPS []  Leeroy Cha, PharmD Georga Bora Pharm D Positive urine culture Treated with Cephalexin, organism sensitive to the same and no further patient follow-up is required at this time.  Genia Del 02/01/2018, 10:11 AM

## 2018-02-25 DIAGNOSIS — Z79899 Other long term (current) drug therapy: Secondary | ICD-10-CM | POA: Diagnosis not present

## 2018-02-25 DIAGNOSIS — I5022 Chronic systolic (congestive) heart failure: Secondary | ICD-10-CM | POA: Diagnosis not present

## 2018-02-25 DIAGNOSIS — E038 Other specified hypothyroidism: Secondary | ICD-10-CM | POA: Diagnosis not present

## 2018-02-25 DIAGNOSIS — N401 Enlarged prostate with lower urinary tract symptoms: Secondary | ICD-10-CM | POA: Diagnosis not present

## 2018-03-12 DIAGNOSIS — H401233 Low-tension glaucoma, bilateral, severe stage: Secondary | ICD-10-CM | POA: Diagnosis not present

## 2018-03-12 DIAGNOSIS — H353133 Nonexudative age-related macular degeneration, bilateral, advanced atrophic without subfoveal involvement: Secondary | ICD-10-CM | POA: Diagnosis not present

## 2018-05-28 DIAGNOSIS — E038 Other specified hypothyroidism: Secondary | ICD-10-CM | POA: Diagnosis not present

## 2018-05-28 DIAGNOSIS — I5022 Chronic systolic (congestive) heart failure: Secondary | ICD-10-CM | POA: Diagnosis not present

## 2018-05-28 DIAGNOSIS — N401 Enlarged prostate with lower urinary tract symptoms: Secondary | ICD-10-CM | POA: Diagnosis not present

## 2018-06-09 DIAGNOSIS — R58 Hemorrhage, not elsewhere classified: Secondary | ICD-10-CM | POA: Diagnosis not present

## 2018-06-09 DIAGNOSIS — W19XXXA Unspecified fall, initial encounter: Secondary | ICD-10-CM | POA: Diagnosis not present

## 2018-06-09 DIAGNOSIS — R000 Tachycardia, unspecified: Secondary | ICD-10-CM | POA: Diagnosis not present

## 2018-06-10 DIAGNOSIS — S199XXA Unspecified injury of neck, initial encounter: Secondary | ICD-10-CM | POA: Diagnosis not present

## 2018-06-10 DIAGNOSIS — I10 Essential (primary) hypertension: Secondary | ICD-10-CM | POA: Diagnosis not present

## 2018-06-10 DIAGNOSIS — Z79890 Hormone replacement therapy (postmenopausal): Secondary | ICD-10-CM | POA: Diagnosis not present

## 2018-06-10 DIAGNOSIS — W06XXXA Fall from bed, initial encounter: Secondary | ICD-10-CM | POA: Diagnosis not present

## 2018-06-10 DIAGNOSIS — Z79899 Other long term (current) drug therapy: Secondary | ICD-10-CM | POA: Diagnosis not present

## 2018-06-10 DIAGNOSIS — S0101XA Laceration without foreign body of scalp, initial encounter: Secondary | ICD-10-CM | POA: Diagnosis not present

## 2018-06-10 DIAGNOSIS — E039 Hypothyroidism, unspecified: Secondary | ICD-10-CM | POA: Diagnosis not present

## 2018-06-10 DIAGNOSIS — S0990XA Unspecified injury of head, initial encounter: Secondary | ICD-10-CM | POA: Diagnosis not present

## 2018-07-01 DIAGNOSIS — I5021 Acute systolic (congestive) heart failure: Secondary | ICD-10-CM | POA: Diagnosis not present

## 2018-07-04 DIAGNOSIS — I5021 Acute systolic (congestive) heart failure: Secondary | ICD-10-CM | POA: Diagnosis not present

## 2018-07-04 DIAGNOSIS — I4891 Unspecified atrial fibrillation: Secondary | ICD-10-CM | POA: Diagnosis not present

## 2018-07-04 DIAGNOSIS — A419 Sepsis, unspecified organism: Secondary | ICD-10-CM | POA: Diagnosis not present

## 2018-07-04 DIAGNOSIS — I509 Heart failure, unspecified: Secondary | ICD-10-CM | POA: Diagnosis not present

## 2018-07-04 DIAGNOSIS — I5043 Acute on chronic combined systolic (congestive) and diastolic (congestive) heart failure: Secondary | ICD-10-CM | POA: Diagnosis not present

## 2018-07-04 DIAGNOSIS — R0902 Hypoxemia: Secondary | ICD-10-CM | POA: Diagnosis not present

## 2018-07-04 DIAGNOSIS — J189 Pneumonia, unspecified organism: Secondary | ICD-10-CM | POA: Diagnosis present

## 2018-07-04 DIAGNOSIS — Z79899 Other long term (current) drug therapy: Secondary | ICD-10-CM | POA: Diagnosis not present

## 2018-07-04 DIAGNOSIS — S0990XA Unspecified injury of head, initial encounter: Secondary | ICD-10-CM | POA: Diagnosis not present

## 2018-07-04 DIAGNOSIS — N183 Chronic kidney disease, stage 3 (moderate): Secondary | ICD-10-CM | POA: Diagnosis present

## 2018-07-04 DIAGNOSIS — N400 Enlarged prostate without lower urinary tract symptoms: Secondary | ICD-10-CM | POA: Diagnosis present

## 2018-07-04 DIAGNOSIS — H409 Unspecified glaucoma: Secondary | ICD-10-CM | POA: Diagnosis present

## 2018-07-04 DIAGNOSIS — Z66 Do not resuscitate: Secondary | ICD-10-CM | POA: Diagnosis present

## 2018-07-04 DIAGNOSIS — R531 Weakness: Secondary | ICD-10-CM | POA: Diagnosis not present

## 2018-07-04 DIAGNOSIS — E039 Hypothyroidism, unspecified: Secondary | ICD-10-CM | POA: Diagnosis present

## 2018-07-04 DIAGNOSIS — R791 Abnormal coagulation profile: Secondary | ICD-10-CM | POA: Diagnosis not present

## 2018-07-04 DIAGNOSIS — R0602 Shortness of breath: Secondary | ICD-10-CM | POA: Diagnosis not present

## 2018-07-04 DIAGNOSIS — R0989 Other specified symptoms and signs involving the circulatory and respiratory systems: Secondary | ICD-10-CM | POA: Diagnosis not present

## 2018-07-04 DIAGNOSIS — Z87891 Personal history of nicotine dependence: Secondary | ICD-10-CM | POA: Diagnosis not present

## 2018-07-04 DIAGNOSIS — N390 Urinary tract infection, site not specified: Secondary | ICD-10-CM | POA: Diagnosis not present

## 2018-07-04 DIAGNOSIS — N3001 Acute cystitis with hematuria: Secondary | ICD-10-CM | POA: Diagnosis not present

## 2018-07-04 DIAGNOSIS — R05 Cough: Secondary | ICD-10-CM | POA: Diagnosis not present

## 2018-07-04 DIAGNOSIS — I130 Hypertensive heart and chronic kidney disease with heart failure and stage 1 through stage 4 chronic kidney disease, or unspecified chronic kidney disease: Secondary | ICD-10-CM | POA: Diagnosis not present

## 2018-07-04 DIAGNOSIS — E871 Hypo-osmolality and hyponatremia: Secondary | ICD-10-CM | POA: Diagnosis not present

## 2018-07-12 DIAGNOSIS — N183 Chronic kidney disease, stage 3 (moderate): Secondary | ICD-10-CM | POA: Diagnosis not present

## 2018-07-12 DIAGNOSIS — C651 Malignant neoplasm of right renal pelvis: Secondary | ICD-10-CM | POA: Diagnosis not present

## 2018-07-12 DIAGNOSIS — Z905 Acquired absence of kidney: Secondary | ICD-10-CM | POA: Diagnosis not present

## 2018-07-12 DIAGNOSIS — C672 Malignant neoplasm of lateral wall of bladder: Secondary | ICD-10-CM | POA: Diagnosis not present

## 2018-07-15 ENCOUNTER — Other Ambulatory Visit: Payer: Self-pay | Admitting: Urology

## 2018-07-16 ENCOUNTER — Other Ambulatory Visit: Payer: Self-pay

## 2018-07-16 ENCOUNTER — Encounter (HOSPITAL_BASED_OUTPATIENT_CLINIC_OR_DEPARTMENT_OTHER): Payer: Self-pay | Admitting: *Deleted

## 2018-07-16 NOTE — Progress Notes (Signed)
Spoke with patients son [name] via telephone for pre op interview. Son verbalized understanding for patient to be NPO after MN. Patient to take all morning medications with a sip of water AM of surgery except Lasix. Current EKG on chart and Epic. Will need ISTAT 8. Son will need to come back to pre op with patient. Arrival time 0830.

## 2018-07-18 DIAGNOSIS — R404 Transient alteration of awareness: Secondary | ICD-10-CM | POA: Diagnosis not present

## 2018-07-31 ENCOUNTER — Ambulatory Visit (HOSPITAL_BASED_OUTPATIENT_CLINIC_OR_DEPARTMENT_OTHER): Admit: 2018-07-31 | Payer: Medicare Other | Admitting: Urology

## 2018-07-31 SURGERY — CYSTOSCOPY, WITH RETROGRADE PYELOGRAM AND URETERAL STENT INSERTION
Anesthesia: General | Laterality: Left

## 2018-08-10 DIAGNOSIS — 419620001 Death: Secondary | SNOMED CT | POA: Diagnosis not present

## 2018-08-10 DEATH — deceased

## 2020-05-02 IMAGING — DX DG CHEST 2V
2 series · 2 of 2 positions shown · non-contrast
Comparison: 12/22/2017, 10/02/2016

CLINICAL DATA: Chest pain

EXAM:
CHEST - 2 VIEW

[chest ap]
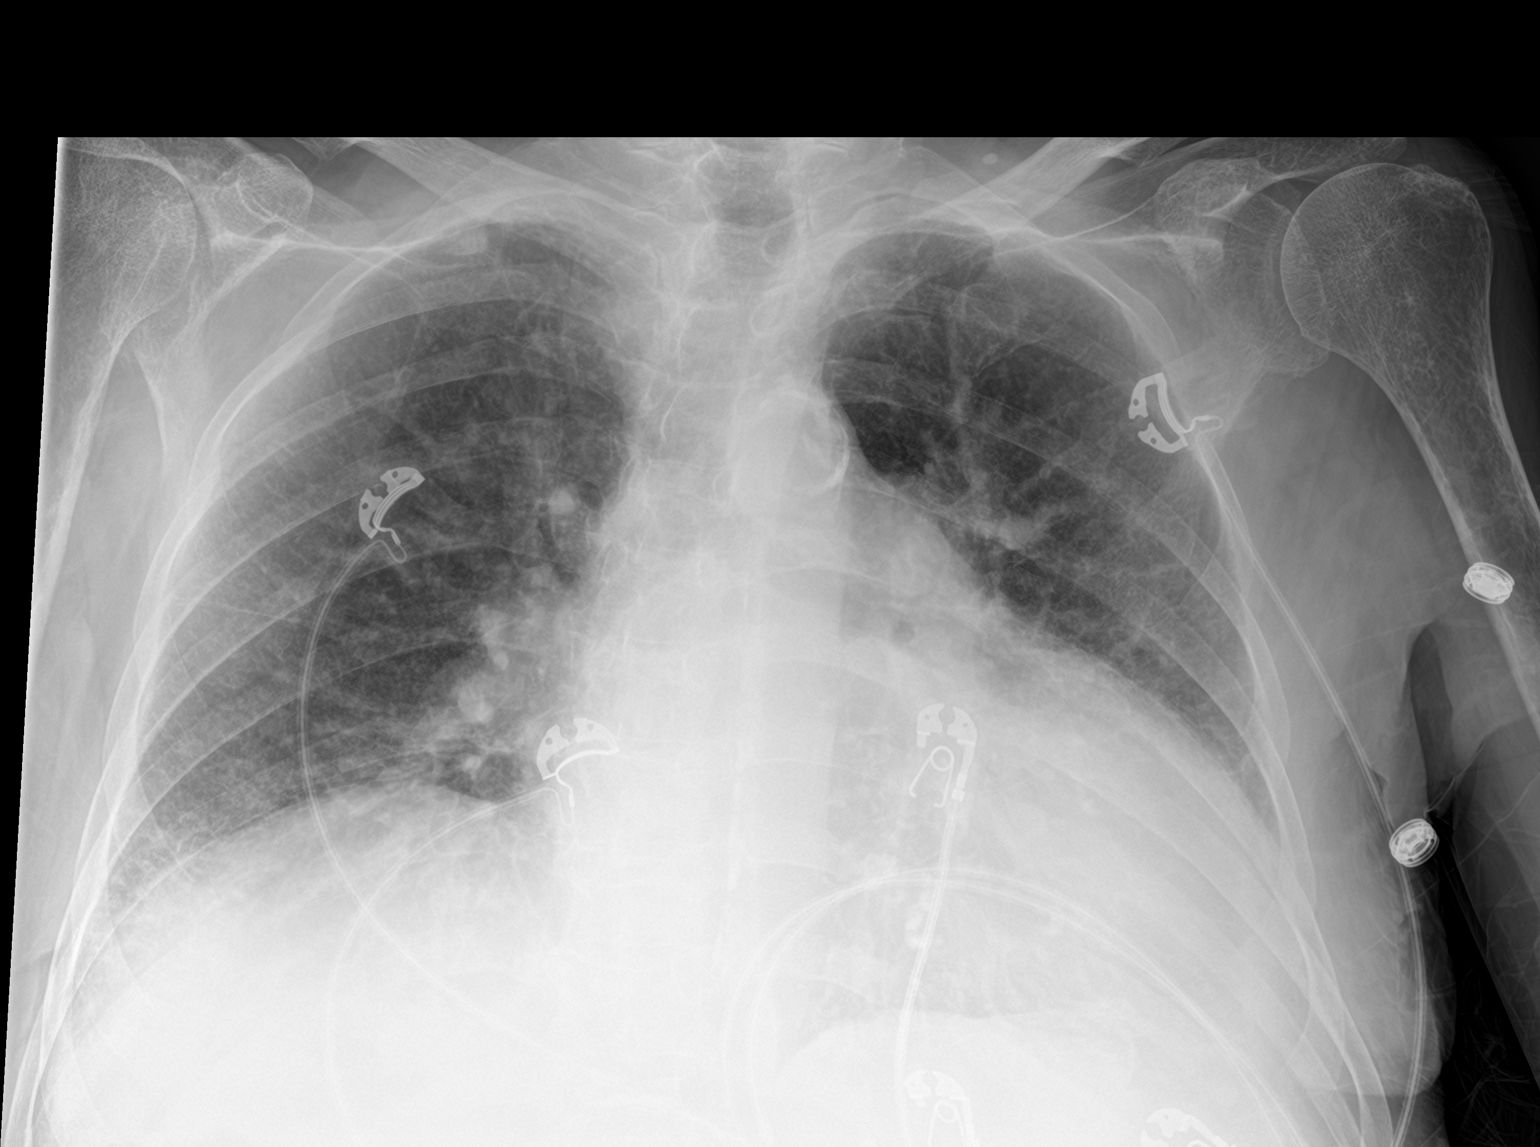

[chest lat]
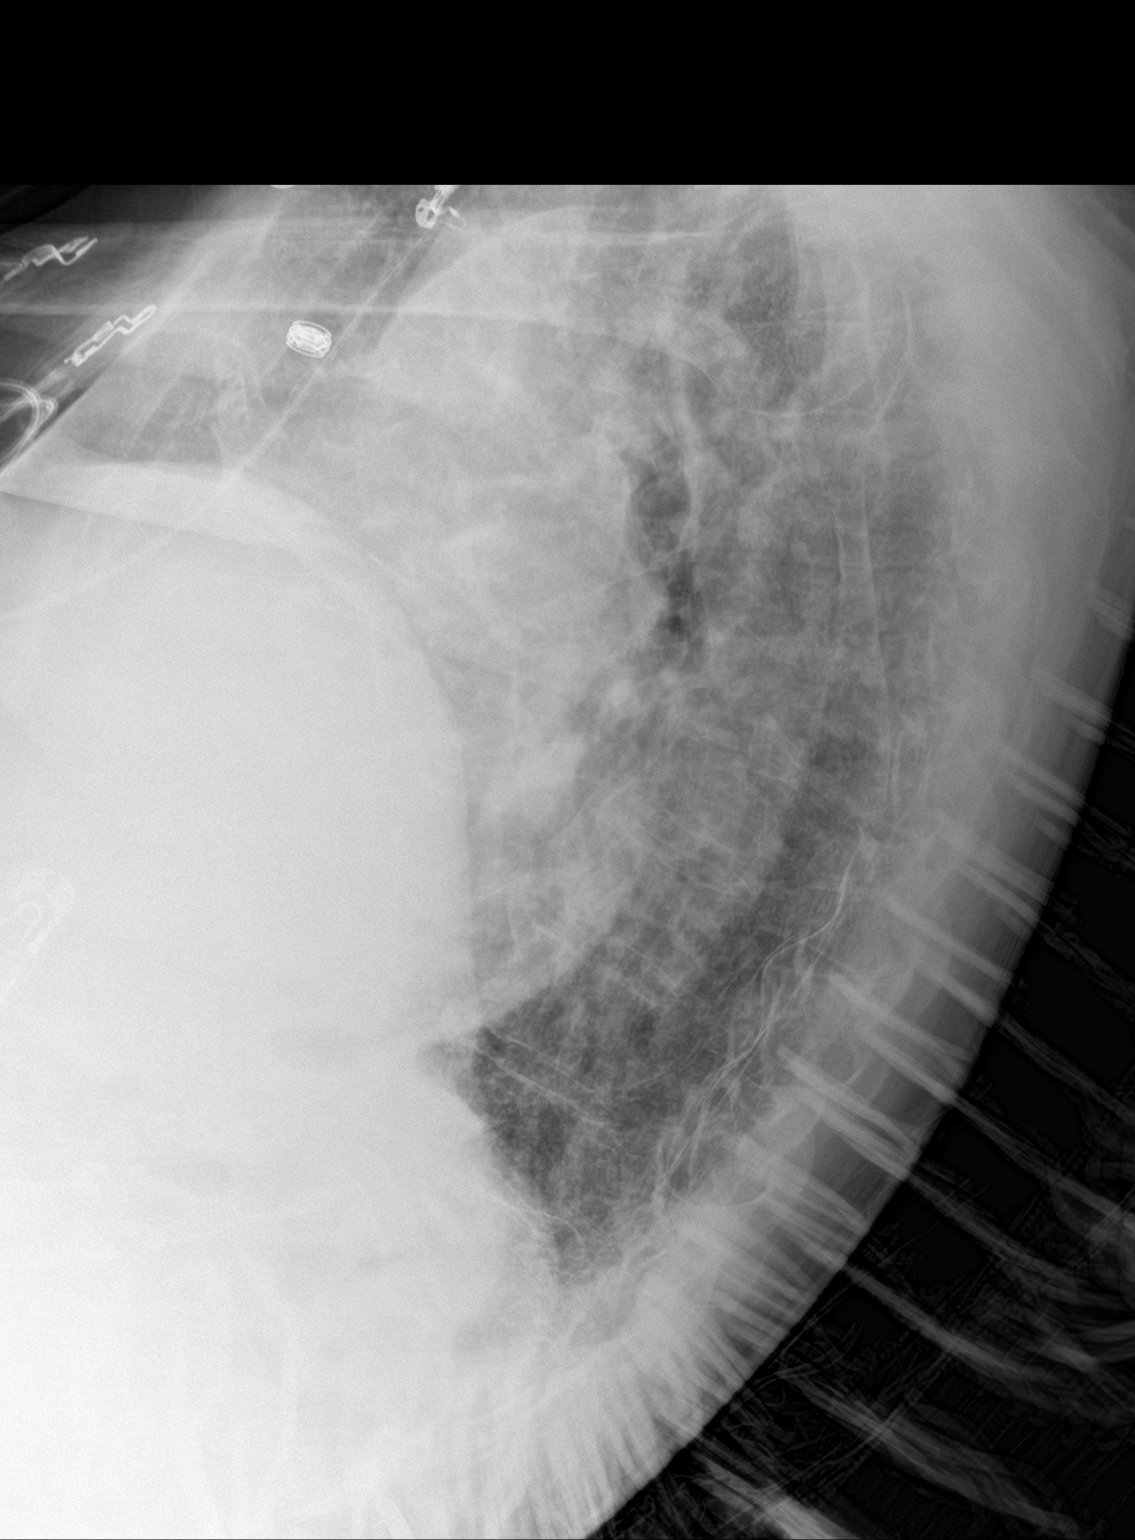

[2 of 2 positions shown; findings below may reference images not displayed]

FINDINGS: Cardiomegaly with vascular congestion. Streaky atelectasis at the
left base. No pleural effusion. Aortic atherosclerosis. No
pneumothorax.
IMPRESSION: 1. Streaky atelectasis at the left base.  No focal consolidation.
2. Cardiomegaly with vascular congestion.

## 2020-05-02 IMAGING — CT CT ABD-PELV W/O CM
2 of 4 series · 16 of 46 positions shown, 18 images · non-contrast
Comparison: 10/20/2017

CLINICAL DATA: Abdominal pain. Dysuria. Prostate cancer. Solitary
kidney.

EXAM:
CT ABDOMEN AND PELVIS WITHOUT CONTRAST
TECHNIQUE: Multidetector CT imaging of the abdomen and pelvis was performed
following the standard protocol without IV contrast.

[Series 2: axial st · axial · 0.71mm/px · z∈[-606,-201]mm · 13 of 89 slices shown, 15 images]
[im 4/89  soft-tissue]
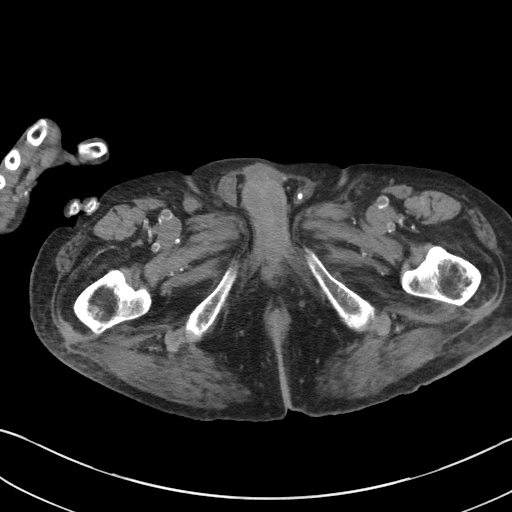
[im 4/89  bone]
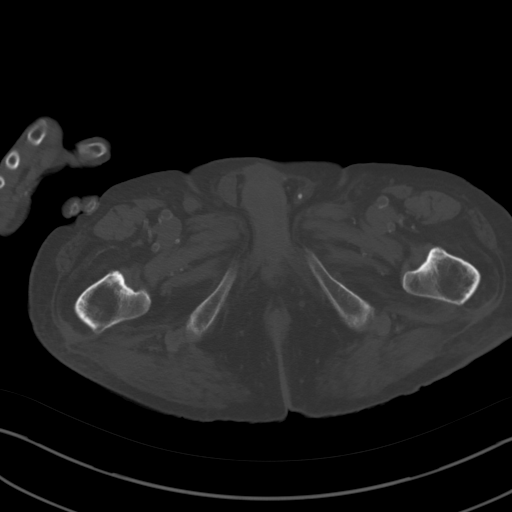
[im 11/89  soft-tissue]
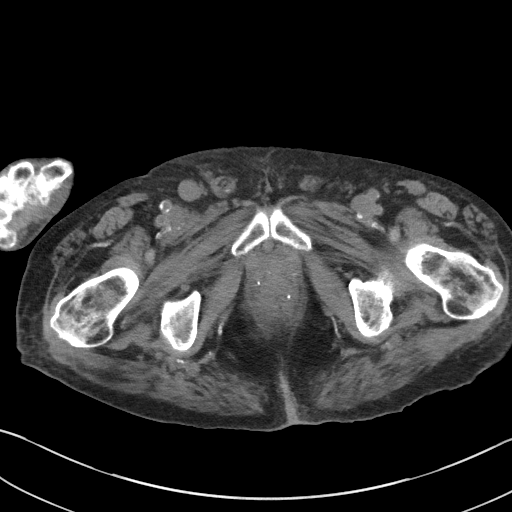
[im 17/89  soft-tissue]
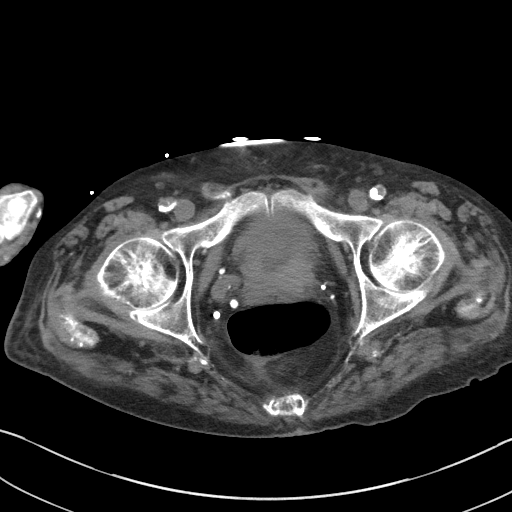
[im 24/89  soft-tissue]
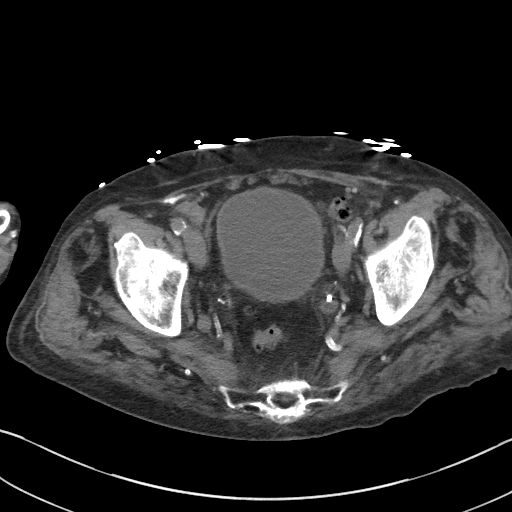
[im 31/89  soft-tissue]
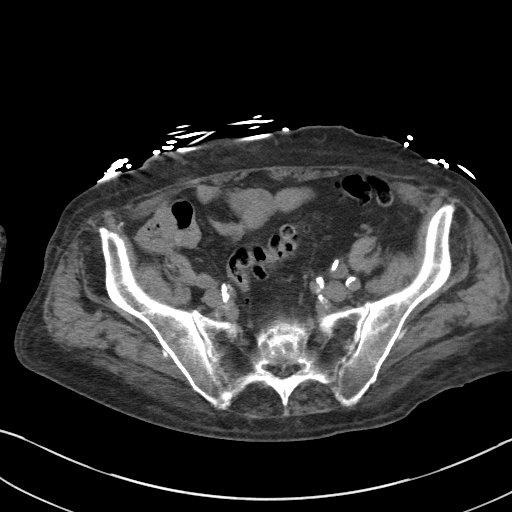
[im 38/89  soft-tissue]
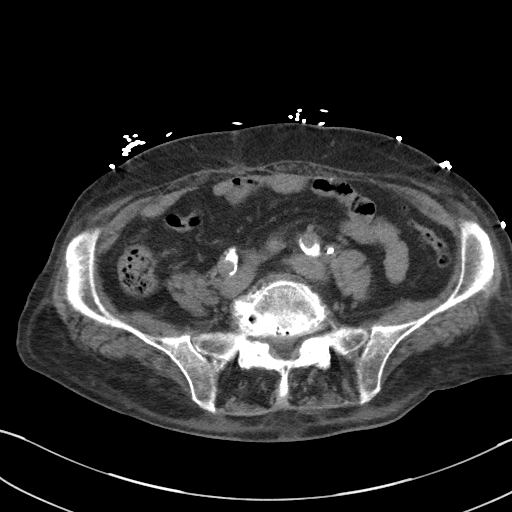
[im 45/89  soft-tissue]
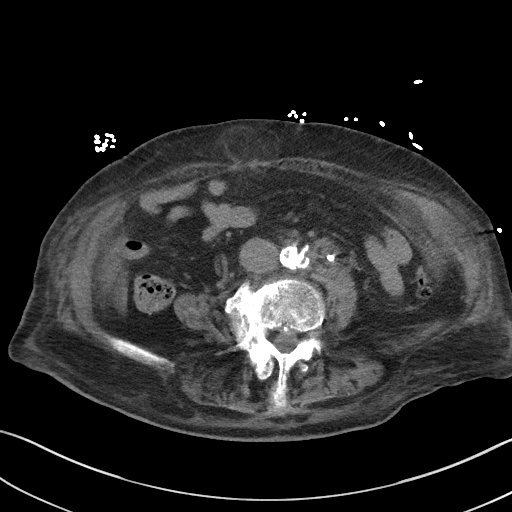
[im 51/89  soft-tissue]
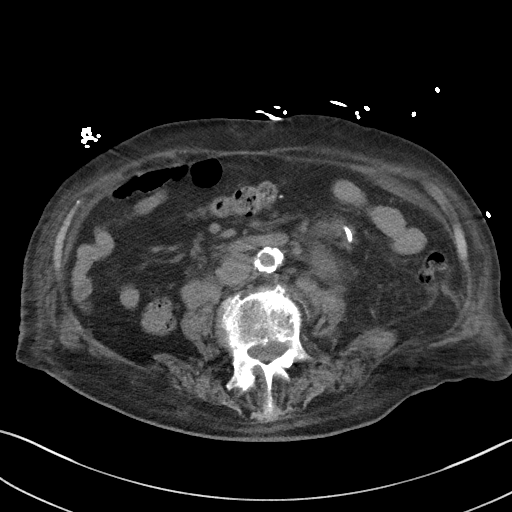
[im 58/89  soft-tissue]
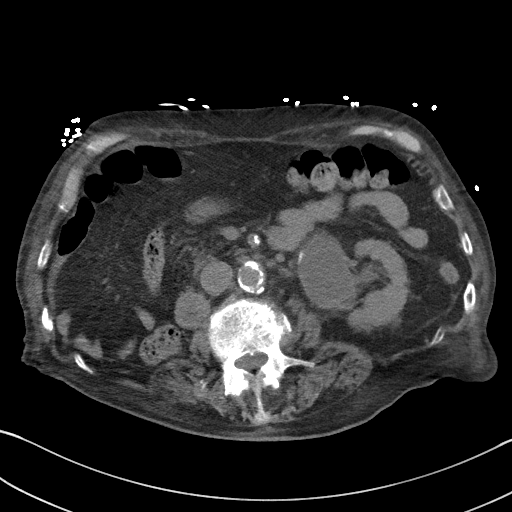
[im 58/89  bone]
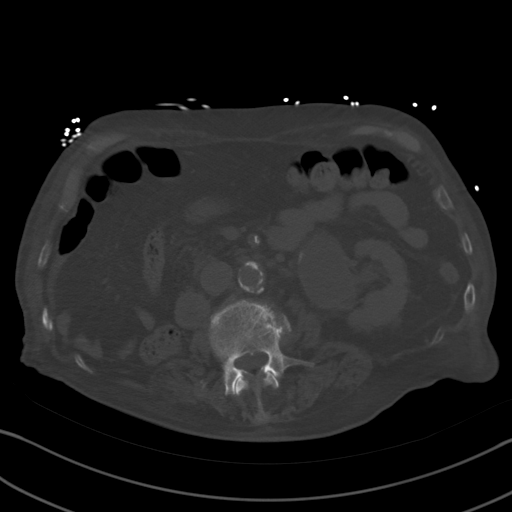
[im 65/89  soft-tissue]
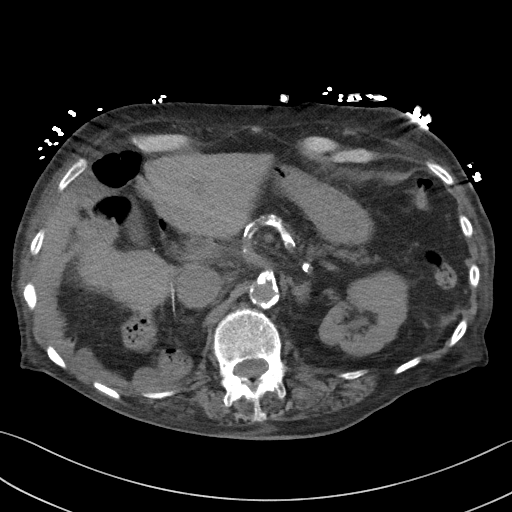
[im 72/89  soft-tissue]
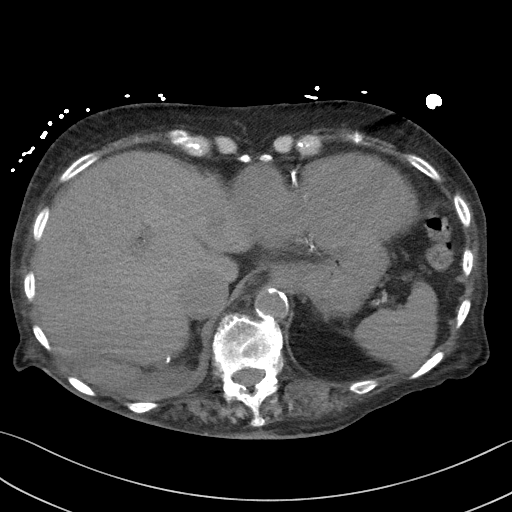
[im 78/89  soft-tissue]
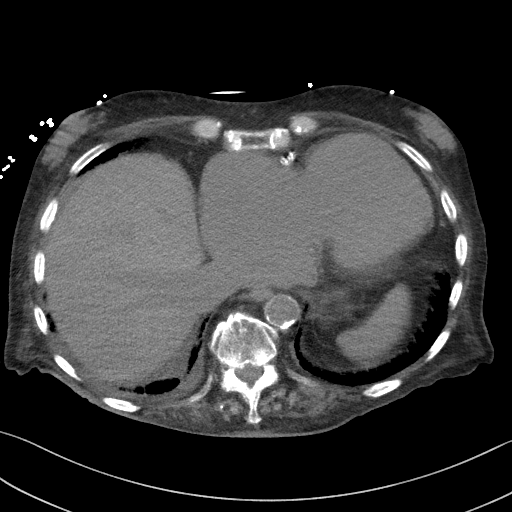
[im 85/89  soft-tissue]
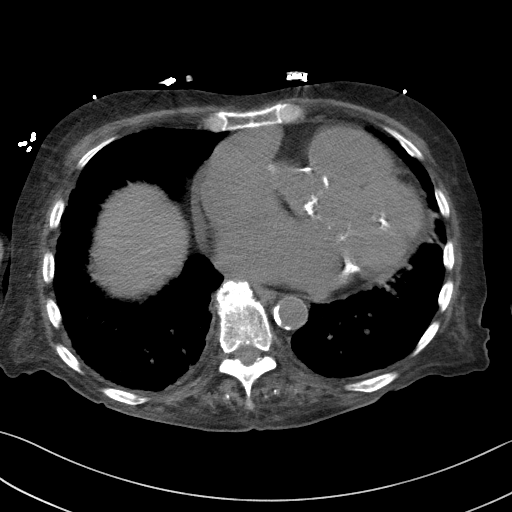

[Series 5: coronal st · coronal · 0.80mm/px · 3 of 80 slices shown]
[im 27/80  soft-tissue]
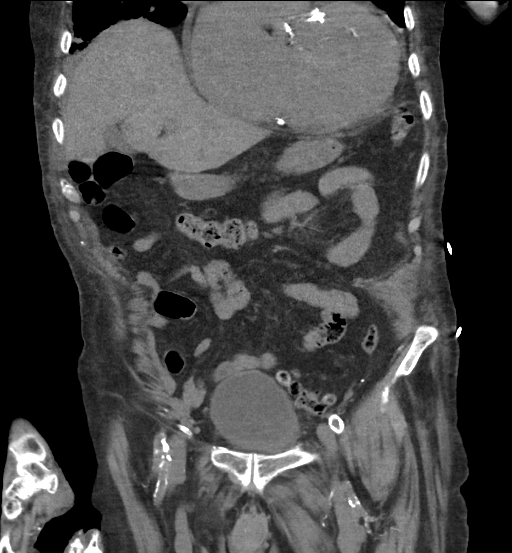
[im 36/80  soft-tissue]
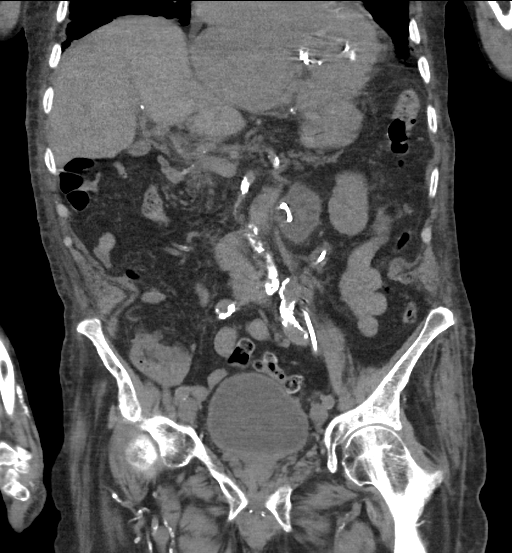
[im 44/80  soft-tissue]
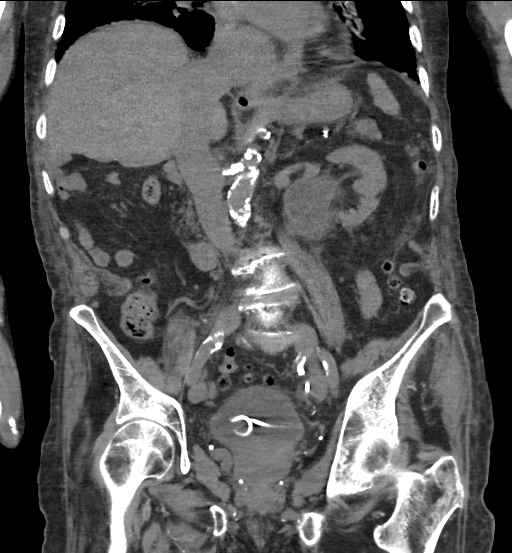

[16 of 46 positions shown; findings below may reference images not displayed]

FINDINGS: Lower chest: Interstitial accentuation at the lung bases is likely
within normal variation in this age group. Moderate cardiomegaly
with coronary artery atherosclerosis. Trace right pleural fluid
persists.

Hepatobiliary: Normal liver. Normal gallbladder, without biliary
ductal dilatation.

Pancreas: Normal pancreas for age, without duct dilatation.

Spleen: Normal in size, without focal abnormality.

Adrenals/Urinary Tract: Similar bilateral adrenal nodularity,
greater on the left. Right renal absence.

Left renal cortical thinning. There is left extrarenal pelvis
without overt hydronephrosis. Left ureteric stent originates in the
left extrarenal pelvis and terminates in the urinary bladder. No
urinary tract calculi.

Stomach/Bowel: Proximal gastric underdistention. Extensive colonic
diverticulosis. Normal terminal ileum. The appendix is likely
identified on 56/2. No surrounding inflammation. Normal small bowel
caliber.

Vascular/Lymphatic: Advanced aortic and branch vessel
atherosclerosis. No abdominopelvic adenopathy.

Reproductive: Moderate prostatomegaly with probable TURP defect.

Other: No significant free fluid. No free intraperitoneal air. Right
inguinal hernia repair. Fat containing ventral abdominal wall
hernia.

Musculoskeletal: Lumbosacral spondylosis is advanced. A chronic
compression deformity at T11 is moderate to severe with mild ventral
canal encroachment. S-shaped lumbar spine curvature.
IMPRESSION: 1.  No acute process in the abdomen or pelvis.
2. Solitary left kidney with ureteric stent in place. Left
extrarenal pelvis, without overt hydronephrosis and no evidence of
ureteric calculi.
3. Small right pleural effusion.
4. Coronary artery atherosclerosis. Aortic Atherosclerosis
([id number]).
5. Other incidental findings, as above.
